# Patient Record
Sex: Female | Born: 1940 | Race: White | Hispanic: No | Smoking: Never smoker
Health system: Southern US, Community
[De-identification: ages and names within clinical notes are randomized; demographics above are authoritative.]

## PROBLEM LIST (undated history)

## (undated) DIAGNOSIS — K5790 Diverticulosis of intestine, part unspecified, without perforation or abscess without bleeding: Secondary | ICD-10-CM

## (undated) DIAGNOSIS — N815 Vaginal enterocele: Secondary | ICD-10-CM

## (undated) DIAGNOSIS — E119 Type 2 diabetes mellitus without complications: Secondary | ICD-10-CM

## (undated) DIAGNOSIS — N183 Chronic kidney disease, stage 3 (moderate): Secondary | ICD-10-CM

## (undated) DIAGNOSIS — F0390 Unspecified dementia without behavioral disturbance: Secondary | ICD-10-CM

## (undated) DIAGNOSIS — I10 Essential (primary) hypertension: Secondary | ICD-10-CM

## (undated) DIAGNOSIS — E785 Hyperlipidemia, unspecified: Secondary | ICD-10-CM

## (undated) DIAGNOSIS — K047 Periapical abscess without sinus: Secondary | ICD-10-CM

## (undated) HISTORY — DX: Vaginal enterocele: N81.5

## (undated) HISTORY — PX: ABDOMINAL HYSTERECTOMY: SHX81

## (undated) HISTORY — DX: Type 2 diabetes mellitus without complications: E11.9

## (undated) HISTORY — PX: DENTAL SURGERY: SHX609

## (undated) HISTORY — DX: Diverticulosis of intestine, part unspecified, without perforation or abscess without bleeding: K57.90

## (undated) HISTORY — DX: Unspecified dementia, unspecified severity, without behavioral disturbance, psychotic disturbance, mood disturbance, and anxiety: F03.90

## (undated) HISTORY — PX: CHOLECYSTECTOMY: SHX55

## (undated) HISTORY — DX: Hyperlipidemia, unspecified: E78.5

## (undated) HISTORY — DX: Periapical abscess without sinus: K04.7

## (undated) HISTORY — PX: OTHER SURGICAL HISTORY: SHX169

## (undated) HISTORY — DX: Essential (primary) hypertension: I10

---

## 1997-08-10 ENCOUNTER — Inpatient Hospital Stay (HOSPITAL_COMMUNITY): Admission: RE | Admit: 1997-08-10 | Discharge: 1997-08-11 | Payer: Self-pay | Admitting: Gynecology

## 1998-05-04 ENCOUNTER — Other Ambulatory Visit: Admission: RE | Admit: 1998-05-04 | Discharge: 1998-05-04 | Payer: Self-pay | Admitting: Gynecology

## 1999-05-04 ENCOUNTER — Other Ambulatory Visit: Admission: RE | Admit: 1999-05-04 | Discharge: 1999-05-04 | Payer: Self-pay | Admitting: Gynecology

## 1999-08-22 ENCOUNTER — Encounter: Admission: RE | Admit: 1999-08-22 | Discharge: 1999-11-20 | Payer: Self-pay | Admitting: Endocrinology

## 2000-05-07 ENCOUNTER — Other Ambulatory Visit: Admission: RE | Admit: 2000-05-07 | Discharge: 2000-05-07 | Payer: Self-pay | Admitting: Gynecology

## 2000-09-20 ENCOUNTER — Ambulatory Visit (HOSPITAL_COMMUNITY): Admission: RE | Admit: 2000-09-20 | Discharge: 2000-09-20 | Payer: Self-pay | Admitting: Gastroenterology

## 2001-05-01 ENCOUNTER — Other Ambulatory Visit: Admission: RE | Admit: 2001-05-01 | Discharge: 2001-05-01 | Payer: Self-pay | Admitting: Gynecology

## 2001-06-10 ENCOUNTER — Ambulatory Visit (HOSPITAL_COMMUNITY): Admission: RE | Admit: 2001-06-10 | Discharge: 2001-06-10 | Payer: Self-pay | Admitting: Gastroenterology

## 2002-05-04 ENCOUNTER — Other Ambulatory Visit: Admission: RE | Admit: 2002-05-04 | Discharge: 2002-05-04 | Payer: Self-pay | Admitting: Gynecology

## 2002-09-14 ENCOUNTER — Emergency Department (HOSPITAL_COMMUNITY): Admission: EM | Admit: 2002-09-14 | Discharge: 2002-09-14 | Payer: Self-pay | Admitting: Emergency Medicine

## 2003-04-06 ENCOUNTER — Ambulatory Visit (HOSPITAL_COMMUNITY): Admission: RE | Admit: 2003-04-06 | Discharge: 2003-04-06 | Payer: Self-pay | Admitting: General Surgery

## 2003-05-03 ENCOUNTER — Encounter (INDEPENDENT_AMBULATORY_CARE_PROVIDER_SITE_OTHER): Payer: Self-pay | Admitting: *Deleted

## 2003-05-03 ENCOUNTER — Ambulatory Visit (HOSPITAL_COMMUNITY): Admission: RE | Admit: 2003-05-03 | Discharge: 2003-05-04 | Payer: Self-pay | Admitting: General Surgery

## 2003-05-24 ENCOUNTER — Other Ambulatory Visit: Admission: RE | Admit: 2003-05-24 | Discharge: 2003-05-24 | Payer: Self-pay | Admitting: Gynecology

## 2004-05-23 ENCOUNTER — Other Ambulatory Visit: Admission: RE | Admit: 2004-05-23 | Discharge: 2004-05-23 | Payer: Self-pay | Admitting: Gynecology

## 2005-06-04 ENCOUNTER — Other Ambulatory Visit: Admission: RE | Admit: 2005-06-04 | Discharge: 2005-06-04 | Payer: Self-pay | Admitting: Gynecology

## 2006-04-10 ENCOUNTER — Ambulatory Visit: Payer: Self-pay | Admitting: Gastroenterology

## 2006-04-29 ENCOUNTER — Ambulatory Visit: Payer: Self-pay | Admitting: Gastroenterology

## 2006-06-29 ENCOUNTER — Inpatient Hospital Stay (HOSPITAL_COMMUNITY): Admission: AD | Admit: 2006-06-29 | Discharge: 2006-07-01 | Payer: Self-pay | Admitting: Internal Medicine

## 2006-07-01 ENCOUNTER — Ambulatory Visit: Payer: Self-pay | Admitting: Vascular Surgery

## 2006-07-01 ENCOUNTER — Encounter (INDEPENDENT_AMBULATORY_CARE_PROVIDER_SITE_OTHER): Payer: Self-pay | Admitting: Internal Medicine

## 2007-05-28 ENCOUNTER — Ambulatory Visit: Payer: Self-pay | Admitting: Cardiology

## 2007-05-28 ENCOUNTER — Inpatient Hospital Stay (HOSPITAL_COMMUNITY): Admission: EM | Admit: 2007-05-28 | Discharge: 2007-05-30 | Payer: Self-pay | Admitting: Emergency Medicine

## 2007-05-29 ENCOUNTER — Ambulatory Visit: Payer: Self-pay | Admitting: Vascular Surgery

## 2007-05-29 ENCOUNTER — Encounter (INDEPENDENT_AMBULATORY_CARE_PROVIDER_SITE_OTHER): Payer: Self-pay | Admitting: Internal Medicine

## 2007-05-30 ENCOUNTER — Ambulatory Visit: Payer: Self-pay

## 2007-05-30 ENCOUNTER — Encounter: Payer: Self-pay | Admitting: Endocrinology

## 2007-07-31 ENCOUNTER — Other Ambulatory Visit: Admission: RE | Admit: 2007-07-31 | Discharge: 2007-07-31 | Payer: Self-pay | Admitting: Gynecology

## 2008-10-27 ENCOUNTER — Ambulatory Visit (HOSPITAL_COMMUNITY): Admission: RE | Admit: 2008-10-27 | Discharge: 2008-10-28 | Payer: Self-pay | Admitting: Obstetrics and Gynecology

## 2009-10-27 ENCOUNTER — Emergency Department (HOSPITAL_COMMUNITY): Admission: EM | Admit: 2009-10-27 | Discharge: 2009-10-27 | Payer: Self-pay | Admitting: Emergency Medicine

## 2010-03-24 ENCOUNTER — Other Ambulatory Visit: Payer: Self-pay | Admitting: Gynecology

## 2010-03-24 DIAGNOSIS — R928 Other abnormal and inconclusive findings on diagnostic imaging of breast: Secondary | ICD-10-CM

## 2010-03-31 ENCOUNTER — Ambulatory Visit
Admission: RE | Admit: 2010-03-31 | Discharge: 2010-03-31 | Disposition: A | Discharge status: Home / Self Care | Payer: Medicare Other | Source: Ambulatory Visit | Attending: Gynecology | Admitting: Gynecology

## 2010-03-31 ENCOUNTER — Other Ambulatory Visit: Payer: Self-pay | Admitting: Gynecology

## 2010-03-31 DIAGNOSIS — R928 Other abnormal and inconclusive findings on diagnostic imaging of breast: Secondary | ICD-10-CM

## 2010-04-27 LAB — CBC
HCT: 30.5 % — ABNORMAL LOW (ref 36.0–46.0)
HCT: 40.1 % (ref 36.0–46.0)
Hemoglobin: 13.5 g/dL (ref 12.0–15.0)
MCHC: 34.5 g/dL (ref 30.0–36.0)
MCV: 95.2 fL (ref 78.0–100.0)
RBC: 3.2 MIL/uL — ABNORMAL LOW (ref 3.87–5.11)
WBC: 7.7 10*3/uL (ref 4.0–10.5)

## 2010-04-27 LAB — COMPREHENSIVE METABOLIC PANEL
AST: 20 U/L (ref 0–37)
Albumin: 4 g/dL (ref 3.5–5.2)
Alkaline Phosphatase: 66 U/L (ref 39–117)
BUN: 20 mg/dL (ref 6–23)
BUN: 7 mg/dL (ref 6–23)
CO2: 30 mEq/L (ref 19–32)
Calcium: 7.9 mg/dL — ABNORMAL LOW (ref 8.4–10.5)
Chloride: 99 mEq/L (ref 96–112)
Creatinine, Ser: 0.87 mg/dL (ref 0.4–1.2)
GFR calc Af Amer: >60 mL/min (ref >60)
GFR calc non Af Amer: >60 mL/min (ref >60)
Glucose, Bld: 121 mg/dL — ABNORMAL HIGH (ref 70–99)
Potassium: 3.6 mEq/L (ref 3.5–5.1)
Total Bilirubin: 1.1 mg/dL (ref 0.3–1.2)

## 2010-04-27 LAB — GLUCOSE, CAPILLARY
Glucose-Capillary: 110 mg/dL — ABNORMAL HIGH (ref 70–99)
Glucose-Capillary: 111 mg/dL — ABNORMAL HIGH (ref 70–99)
Glucose-Capillary: 99 mg/dL (ref 70–99)

## 2010-06-06 NOTE — H&P (Signed)
[name], [name]                ACCOUNT NO.:  [id number]   MEDICAL RECORD NO.:  [id number]          PATIENT TYPE:  INP   LOCATION:  0104                         FACILITY:  [facility]   PHYSICIAN:  Herbie Saxon, MDDATE OF BIRTH:  1940-10-20   DATE OF ADMISSION:  05/28/2007  DATE OF DISCHARGE:                              HISTORY & PHYSICAL   PRESENTING COMPLAINT:  Chest pain x1 day.   HISTORY OF PRESENT ILLNESS:  A 70 year old Caucasian lady with a past  medical history of hypertension, diabetes, hyperlipidemia, a family  history of coronary artery disease and also a history of diverticulosis.  The patient has been noticing left arm pain for the last six months with  intermittent numbness.  Then she had an episode of chest pain that was  very transient one week ago.  Today while she was driving, the patient  noticed chest pain at about a 3-4/10, sharp, very transient, less than  two seconds.  She noticed left upper extremity tingling most marked in  the left palm.  One week ago she had a chest pain which she noticed by  dropping a bag she was carrying and after resting the chest pain  subsided.  The patient also notices that when she gets up from sitting  appropriately, she gets dizzy.  She denies any palpitations.  No  syncopal episodes.  No seizures.  No cough or shortness of breath, no  fever.  The patient has no history of chest trauma.  She does have  frequent heartburn.  She is on Prilosec for this.  There is no diarrhea,  constipation or abdominal distention.  She complies with her medication,  and her last hemoglobin A1c check was 5.6.  There is no joint swelling.  There is no blurring of vision.  She denies any orthopnea, paroxysmal  nocturnal dyspnea or pedal swelling.  No hematuria, dysuria or frequency  of urination.   PAST MEDICAL HISTORY:  As stated earlier.   PAST SURGICAL HISTORY:  1. Cholecystectomy.  2. Hysterectomy.  3. Knee surgery.   SOCIAL HISTORY:   She is married.  She has three children.  There is no  history of drug, alcohol or tobacco abuse.   FAMILY HISTORY:  Father died of coronary artery disease at age 65.  He  also had diabetes.   MEDICATIONS:  1. Actos 30 mg daily.  2. Cymbalta 20 mg daily.  3. Lisinopril 40 mg daily.  4. Multivitamin one tab daily.  5. Fish oil 3000 mg daily.  6. Niacin 500 mg daily.  7. Hydroxyzine hydrochloride 10 mg p.r.n.  8. Prilosec 20 mg daily.   ALLERGIES:  FLAGYL.   REVIEW OF SYSTEMS:  Review of systems reviewed.  Pertinent positives as  stated above.   PHYSICAL EXAMINATION:  GENERAL:  She is an older lady, not in acute  distress.  She is obese.  She is anxious.  VITAL SIGNS:  Temperature 97.2 degrees, pulse 82, respirations 18, blood  pressure 150/97.  HEENT:  Pupils equal, reactive to light and accommodation.  Extraocular  muscles intact.  Oropharynx and  nasopharynx mucous membranes are moist.  NECK:  Supple.  No evidence of jugular venous distention, no carotid  bruits, no thyromegaly.  HEART:  Sounds 1 and 2.  Regular rate and rhythm.  No murmurs, no  thrills, no heaves, no gallops.  CHEST:  Clinically clear.  No rales or rhonchi.  ABDOMEN:  Truncal obesity, soft, nontender.  No organomegaly palpable.  Inguinal orifices are patent.  NEUROLOGIC:  She is alert and oriented to time, place and person.  Power  is 5/5.  Cranial nerves II-XII  intact.  Sensory system grossly normal.  EXTREMITIES:  Peripheral pulses present.  No pedal edema.   LABORATORY DATA:  WBC 6.5, hematocrit 38, platelet count 306.  Sodium  138, potassium 4.6, chloride 105, bicarbonate 26, glucose 121, BUN 28,  creatinine 1.2.  Troponin less than 0.05.  Saturation 99% on room air.   Electrocardiogram shows a normal sinus rhythm at 94 per minute, left  atrial enlargement, borderline.   ASSESSMENT:  1. Chest pain, rule out unstable angina.  2. Dehydration.  3. Acute pre-renal azotemia.  4. Dizziness and  lethargic symptoms.  5. Mild hypertension.  6. Diabetes, stable.  7. Morbid obesity.  8. Hyperlipidemia.   PLAN:  She needs to be admitted to a telemetry bed.  Her diet will be  1800 calorie ADA, low-cholesterol, 2-gram sodium diet.  Her activity  will be bedrest.  D-5-W half normal saline at 25 mL an hour.  O2 at 2 to  5 liters by nasal cannula to keep her saturations at greater than 90%.  Lopressor 2.5 mg IV q.6h. p.r.n. for blood pressure greater than  160/110.  Will get serial cardiac enzymes and electrocardiograms q.8h.  x3.  Obtain a cardiac beta natriuretic peptide, thyroid function tests,  repeat homocysteine, sodium, calcium, magnesium, phosphate level.  Recheck a Hemoccult x3.  Obtain a D-dimer, a 2-D echocardiogram, carotid  Dopplers, CT of the brain.  Start her on Lovenox 40 mg subcu daily,  Protonix 40 mg IV daily, Lovenox for deep venous thrombosis prophylaxis.  This can be adjusted by pharmacy, aspirin 325 mg daily, Protonix 40 mg  IV q.24h, Lovenox 1 mg subcu q.12h; pharmacy to adjust, albuterol one  unit dose q.6h. p.r.n., NovoLog sliding scale insulin coverage  persistent scale, oxycodone  5 mg q.4h. p.r.n., morphine 2 mg IV q.6h.  p.r.n.  Start on Nitrol paste 1/2 inch q.6h., metoprolol 25 mg twice  daily, Lisinopril 20 mg daily.  Continue with her home medications of  Cymbalta, Actos, niacin, fish oil and nitroglycerin p.r.n.  Get a CBC  and BMP in the morning.  A cardiology evaluation, page Dr. Corinda Gubler.  The  patient ,s illness, medication ,tests were explained to her and she  verbalizes understanding.     Herbie Saxon, MD  Electronically Signed    MIO/MEDQ  D:  05/28/2007  T:  05/28/2007  Job:  [id number]

## 2010-06-06 NOTE — Discharge Summary (Signed)
[name], [name]                ACCOUNT NO.:  [id number]   MEDICAL RECORD NO.:  [id number]          PATIENT TYPE:  INP   LOCATION:  1418                         FACILITY:  [facility]   PHYSICIAN:  Herbie Saxon, MDDATE OF BIRTH:  08/14/1940   DATE OF ADMISSION:  05/28/2007  DATE OF DISCHARGE:  05/30/2007                               DISCHARGE SUMMARY   DISCHARGE DIAGNOSES:  1. Atypical chest pain.  2. Diabetes mellitus.  3. Hypertension, stable.  4. Hyperlipidemia.  5. Dehydration, improved.  6. History of diverticulosis.  7. Family history of coronary artery disease.   CONSULTS:  Dr. Andee Lineman, [facility].   PROCEDURES:  Patient scheduled for adenosine Myoview at the cardiology  office by Dr. Antoine Poche today, May 30, 2007.   RADIOLOGY:  The CT head of May 28, 2007, negative.  Chest x-ray on May 28, 2007, showed no acute cardiopulmonary disease.   HOSPITAL COURSE:  A 70 year old Caucasian lady presented to the  emergency room at [facility] with the complaint of substernal chest  pain radiating to the left arm with intermittent numbness.  She has been  having on and off left arm pain for the last 6 months.  Patient also  complained of some dizziness with onset of symptoms.  At this admission,  the serial cardiac enzymes have been negative.  Patient was started on  Nitropaste and full-dose Lovenox.  This has been scheduled along with  prophylactic dose Lovenox and nitroglycerin p.r.n.  Blood pressure on  admission was moderately elevated but this has been adequately  controlled with lisinopril with the addition ofToprol.  Toprol was added  on discharge.  Patient was known to be dehydrated and she was given  gentle IV fluid hydration; this also improved.  Anemia is staying  stable.  Hemoccult was negative.   DISCHARGE CONDITION:  Stable.   DIET:  An 1800 calorie, ADA, low cholesterol, low sodium, heart healthy.   ACTIVITY:  Increase slowly as tolerated after having  the adenosine  Myoview today.   FOLLOWUP:  1. With her primary care physician, Dr Lanell Persons of [facility] in 3 to 5 days.  2. Follow up with Dr. Antoine Poche at the cardiology office today at 11:30      a.m.   DISCHARGE MEDICATIONS:  1. Nitroglycerin 0.4 mg sublingual p.r.n. every 5 minutes x3.  2. Percocet 5/325 one to 2 tablets every 4 to 6 hours p.r.n.  3. Toprol XL 12.5 mg daily.  4. Lisinopril 20 mg daily.  5. Foltx1 tablet daily.  6. Enteric-coated aspirin 81 mg daily.  7. Accu-Cheks a.c. and h.s.  8. NovoLog sliding scale coverage with sensitive scale.   Please note that the patient was noted to have elevated homocystine  level and she was started on Foltx.   CURRENT MEDICATIONS:  1. Actos 30 mg daily.  2. Simvastatin 20 mg daily.  3. Multivitamin 1 tablet daily.  4. Fish oil 3000 mg daily.  5. Niacin 500 mg daily.  6. Hydroxyzine 10 mg every 8 hours p.r.n.   EXAMINATION:  Today, she is an elderly lady not in acute distress.  Temperature is 98.  Pulse is 70.  Respiratory rate is 20.  Blood  pressure 121/60.  Pupils are equal and reactive to light and  accommodation.  Extraocular muscles intact.  Mucous membranes are moist.  HEAD:  Atraumatic and normocephalic.  NECK:  Supple.  HEART:  Sounds 1 and 2, regular rate and rhythm.  CHEST:  Clinically clear.  No rales, rhonchi, or wheezes.  No chest  deformity.  ABDOMEN:  She has truncal obesity, nontender.  No organomegaly.  She is alert and oriented x3.  Speech is normal.  Cranial nerves II-XII  intact.  Power 5 globally.  Deep tendon reflexes 2 globally.  EXTREMITIES:  No pedal edema.  No skin rash.  No joint swelling.  Mood  is stable.   AVAILABLE LABS:  Show that the total cholesterol is 150, LDL 63, HDL 62,  BUN is 23, creatinine 1.3, hematocrit is 34, sodium 141, potassium 4.2,  chloride 109, bicarbonate 26, glucose 114.   DISCHARGE TIME:  Greater than 30 minutes.      Herbie Saxon, MD  Electronically Signed     MIO/MEDQ  D:  05/30/2007  T:  05/30/2007  Job:  [id number]   cc:   Learta Codding, MD,FACC  [address]  [place], [place] [address]   Rollene Rotunda, MD, [facility]  [address]  [address]  [place]  [place] [address]   Vikki Ports, M.D.  Fax: [phone]

## 2010-06-06 NOTE — Discharge Summary (Signed)
[name], [name]                ACCOUNT NO.:  [id number]   MEDICAL RECORD NO.:  [id number]          PATIENT TYPE:  INP   LOCATION:  5703                         FACILITY:  [facility]   PHYSICIAN:  Ellie Lunch, M.D.      DATE OF BIRTH:  Jun 08, 1940   DATE OF ADMISSION:  06/29/2006  DATE OF DISCHARGE:  07/01/2006                               DISCHARGE SUMMARY   DISCHARGE DIAGNOSES:  1. Right lower extremity cellulitis.  2. Large hematoma on the lateral aspect of the right knee secondary to      fall.   PAST MEDICAL HISTORY:  1. Type 2 diabetes mellitus.  2. Hypertension.  3. Dyslipidemia.  4. Status post hysterectomy.  5. Status post cholecystectomy.  6. Status post left knee surgery.   DISCHARGE MEDICATIONS:  1. Bactrim DS one pill twice a day for seven more days.  2. Byetta 10 mcg shots twice a day  3. Advicor 500/20 mg daily.  4. Actos 45 mg daily.  5. Furosemide 20 mg daily.  6. Lisinopril 40 mg daily.  7. Prilosec 40 mg daily.  8. Fish oil 1000 mg daily.  9. Spectro-Vite one tablet daily.  10.Baby aspirin 81 mg daily.  11.Caltrate 600 mg p.o. b.i.d.   DISPOSITION AND FOLLOWUP:  The patient will follow up with her primary  care physician in about 1 week to ensure resolution of her right lower  extremity cellulitis.   PROCEDURES DONE:  1. Dopplers performed of the lower extremity did not show any evidence      of DVT, superficial thrombosis or Baker's cyst.  There was a large      hematoma noted on the lateral aspect of the knee.  2. X-rays performed of the tibia and fibula did not show any      significant bony findings or fractures.   ADMISSION HISTORY AND PHYSICAL:  For complete details, please refer to  Dr. Theresa Duty H&P.  In brief, [name] is a 70 year old female that  had fallen at home about 8 days prior to admission after which she  developed redness around the right lower extremity along with extensive  bruising.  She went to see her primary care  physician who put her some  antibiotics, namely Keflex.  She said she was getting better but then  her swelling was getting worse, and thus went to the walk-in clinic and  was then admitted from there for right lower extremity cellulitis.   HOSPITAL COURSE:  #1 - RIGHT LOWER EXTREMITY CELLULITIS.  The patient  had failed Keflex treatment.  She was then placed on IV antibiotics  while she was the hospital.  Note blood cultures were negative.  She is  being discharged home on Bactrim DS, which she was already asked to take  for a right first index fingernail infection, and thus I think it will  also cover the right lower extremity cellulitis.  Also note the patient  has been told that if she does not respond to Bactrim, I have given her  a prescription of Levaquin to help since she was on  IV Levaquin in the  hospital.  Note blood cultures were negative.  The patient had at least  at 75% resolution in the erythema and extent of her cellulitis from day  of admission.  The patient will follow up with her PCP in 1 week to  ensure resolution.   #2 - LARGE HEMATOMA ON THE LATERAL ASPECT OF THE RIGHT KNEE.  This is  most likely secondary to fall.  Note the patient was not on any blood  thinners.  However, she has been told to use hot compresses and  symptomatic treatment with Tylenol and to give rest to that knee.  Note  Dopplers were done and were negative for any form of DVT.   All other conditions were stable and maintained on home medications.  For further details, you may refer to the hospital chart.      Ellie Lunch, M.D.  Electronically Signed     BP/MEDQ  D:  07/01/2006  T:  07/01/2006  Job:  [id number]   cc:   Vikki Ports, M.D.

## 2010-06-06 NOTE — Consult Note (Signed)
[name], [name]                ACCOUNT NO.:  [id number]   MEDICAL RECORD NO.:  [id number]          PATIENT TYPE:  INP   LOCATION:  0104                         [facility]AC[facility]   PHYSICIAN:  Learta Codding, MD,FACC DATE OF BIRTH:  07-19-1940   DATE OF CONSULTATION:  05/28/2007  DATE OF DISCHARGE:                                 CONSULTATION   CARDIOLOGY CONSULTATION   REASON FOR CONSULTATION:  Evaluation of left arm pain and left hand  tingling.   HISTORY OF PRESENT ILLNESS:  The patient is a 70 year old, retired,  white female with multiple cardiac risk factors.  The patient has a  history of hypertension and diabetes mellitus as well as obesity.  She  also has known hyperlipidemia and is on cholesterol lowering agents.  The patient has had a prior stress test approximately six years ago by  Dr. Romero Belling, which was reportedly within normal limits.  The  patient has no prior history of substernal chest pain.  Today, the  patient stated that while driving she suddenly developed left upper arm  pain.  She also noticed a tingling in her left hand.  There was no  associated shortness of breath or diaphoresis.  Shortly after this  episode occurred, the patient did develop a very brief, sharp,  midsternal pain that only lasted seconds.  When she noticed associated  chest pain, the patient drove herself to the emergency room.  On  arrival, there were no electrocardiographic changes suggestive of  ischemia.  It appeared that the patient's left arm pain only lasted  approximately one hour.  She denies any shortness of breath, orthopnea,  or PND.  She also reports no palpitations or syncope.  Approximately a  week ago while at [facility] when she was carrying heavy packages, she did  notice some breathlessness and very briefly chest pain.  Otherwise, the  patient is a very active lady and she rides a bicycle and does yoga.  She did not experience any substernal chest pain on exertion over  the  last several weeks to months.   ALLERGIES:  FLAGYL.   MEDICATION LIST:  1. Actos 30 mg p.o. daily.  2. Simvastatin 20 mg p.o. daily.  3. Lisinopril 30 mg p.o. daily.  4. Multivitamin.  5. Fish oil.  6. Niacin 5 mg daily.  7. Hydroxyzine 10 mg as needed.   PAST MEDICAL HISTORY:  1. Cellulitis of the right lower extremity with a history of hematoma      secondary to fall.  2. Type 2 diabetes mellitus.  3. Hypertension.  4. Dyslipidemia.  5. Status post hysterectomy.  6. Status post cholecystectomy.  7. Status post left knee surgery.   SOCIAL HISTORY:  The patient is married with three children.  Denies a  history of cigarette use or illicit drug use.  She does drink  occasionally.  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   FAMILY HISTORY:  Significant for aortic dissection in her father as well  as a history of heart valve replacement in her mother, who eventually  died from an arrhythmia.   REVIEW OF SYSTEMS:  The patient denies any nausea or vomiting, no fever  or chills, no melena or hematochezia, no dysuria or frequency.  The  remainder of the review of systems is negative.   PHYSICAL EXAMINATION:  VITAL SIGNS:  Blood pressure is 99/52 with a  heart rate of 99 beats per minute, respirations are 18, temperature is  99.4.  GENERAL:  An overweight white female but in no apparent distress.  HEENT:  Pupils are anisocoric, conjunctivae clear.  NECK:  Supple, normal carotid upstroke, no carotid bruits, and no  thyromegaly.  LYMPHATIC EXAM:  No adenopathy in the neck, axillae, or groins.  LUNG EXAM:  Clear breath sounds bilaterally with no wheezing and no  crackles.  HEART EXAM:  Regular rate and rhythm with a soft 2/6 systolic murmur at  the right upper sternal border, and also a 2/6 holosystolic murmur at  the left lower sternal border.  There is no S3.  ABDOMEN:  Soft and nontender with no rebound or guarding, good bowel  sounds.  There is no  hepatosplenomegaly.  EXTREMITY EXAM:  No cyanosis, clubbing, or edema.  The patient has  varicose veins.  NEURO:  The patient is alert, oriented, grossly nonfocal, and cranial  nerves II-XII are grossly intact.  SKIN EXAM:  Essentially within normal limits and normal skin turgor and  no skin lesions.   Electrocardiogram:  Normal sinus rhythm with borderline left atrial  enlargement.  No lateral acute ischemic change.   LABORATORY DATA:  Hemoglobin is 13.1, hematocrit 38.8, MCV is 93.1,  platelet count is 306, protime is 12.7, INR is 0.9, sodium is 138,  potassium is 4.6, chloride is 105, CO2 is 26, glucose 121, BUN is 28,  creatinine is 1.23, GFR is 44.  Troponin is less than 0.05.   Chest x-ray shows no cardiomegaly and no acute abnormalities.   PROBLEM LIST:  1. Substernal chest pain.  Rule out unstable angina.      a.     Cardiac risk factors (diabetes mellitus, hypertension,       obesity, dyslipidemia, questionable family history).      b.     Atypical symptoms with negative initial troponins and EKG.  2. Cardiac murmur as described above.  3. Diabetes mellitus on oral hypoglycemics.  4. Hypertension, controlled.  5. Dyslipidemia on a statin drug therapy.   PLAN:  1. The patient's symptoms are rather atypical, although she is a      diabetic.  However, there are no exertional symptoms.  The patient      can be ruled out for myocardial infarction and if enzyme markers      are negative and she has no further symptoms, anticipate that the      patient can be worked up for ischemic heart disease on an      outpatient basis.  2. If cardiac enzymes are positive, certainly we would proceed with a      cardiac catheterization given her high __________ for probability      of CAD.  If cardiac enzymes are negative, the patient's study can      be arranged with close followup by [facility] in the      outpatient setting.  The patient should also be provided with       aspirin and p.r.n. nitroglycerin at her current medication list.  3. The patient does have cardiac murmurs both at the aortic point and  the mitral point.  I suspect the aortic      murmur is physiologic and possibly related to aortic sclerosis;      however, an echocardiographic study is warranted given the 2/6      murmur at the left lower sternal border consistent with mitral      regurgitation.  4. I discontinued the order of IV heparin as there was a dual order      written for both IV heparin and Lovenox.      Learta Codding, MD,FACC  Electronically Signed     GED/MEDQ  D:  05/28/2007  T:  05/28/2007  Job:  [id number]   cc:   Broadus John T. Pamalee Leyden, MD   Hospitalist Service

## 2010-06-06 NOTE — H&P (Signed)
[name], [name]                ACCOUNT NO.:  [id number]   MEDICAL RECORD NO.:  [id number]          PATIENT TYPE:  INP   LOCATION:  5703                         FACILITY:  [facility]   PHYSICIAN:  Barnetta Chapel, MDDATE OF BIRTH:  February 20, 1940   DATE OF ADMISSION:  06/29/2006  DATE OF DISCHARGE:                              HISTORY & PHYSICAL   REASON FOR ADMISSION:  Cellulitis of the right lower extremity.   HISTORY OF PRESENTING COMPLAINT:  The patient is a 70 year old female  with past medical history significant for diabetes mellitus, status post  hysterectomy, cholecystectomy and left knee surgery.  The patient was  said to have fallen at home about 8 days ago.  She developed redness  around the right lower extremity afterwards.  She went to see her  primary care Kelsey Durflinger who put her on some antibiotics.  She has been on  antibiotics for about 8 days now.  She was eventually referred for  inpatient care, based on the fact that the cellulitis was not improving  significantly.  The patient denies any fever, chills, cough, chest pain,  shortness of breath, headache, neck pain, GI symptoms or urinary  symptoms.  The patient has significant bruise around the right lower  extremity.   PAST MEDICAL HISTORY:  Essentially as above.   MEDICATIONS:  1. Prior to admission include Byetta.  2. Avicor.  3. Actos.  4. Lasix 20 mg p.o. once daily.  5. Lisinopril 40 mg p.o. once daily.  6. Prilosec 40 mg p.o. once daily.   ALLERGIES:  FLAGYL.   SOCIAL HISTORY:  The patient is married with 3 children.  She denied  history of cigarette use or illicit drug use.  She has been drinking  alcohol almost daily for the last 30 years.  No past history of tremors  or withdrawal symptoms.   FAMILY HISTORY:  Significant for MI, diabetes and heart valve problems.   REVIEW OF SYSTEMS:  Essentially as above.   PHYSICAL EXAMINATION:  VITAL SIGNS:  The temperature is 98.1, the blood  pressure is  126/73, heart rate 108, respiratory rate 20 with O2  saturation 99% on room air.  GENERAL CONDITION:  The patient is not in any obvious distress.  HEENT:  No pallor, no jaundice.  Extraocular muscle movements are  intact.  NECK:  Supple.  There is no raised JVD or lymphadenopathy.  LUNGS:  Clear to auscultation.  CARDIOVASCULAR:  S1 and S2, no heart murmur appreciated.  ABDOMEN:  Obese, soft and nontender.  No organomegaly.  Bowel sounds are  present.  NEUROLOGIC:  Examination is nonfocal.  EXTREMITIES:  The left lower extremity has no edema.  The right lower  extremity is swollen, has significant bruise and a very small area  around the anterior part of the shin which is a bit reddish.  The  patient's area of maximal tenderness to palpation is around the mid-  shin.   IMPRESSION:  1. Suspect fracture of the right mid-tibia.  2. Suspect cellulitis of the right lower extremity.  3. Bruise and hematoma of the right lower  extremity following a fall.  4. History of diabetes mellitus.   PLAN:  1. Will admit the patient to a regular medical floor.  Will get an x-      ray of the right tibia.  Will start the patient on IV Levaquin 750      mg once daily and monitor accordingly.  2. Will control the patient's blood pressure.  3. Will get CBC, CMP, magnesium and __________  and fasting lipid      profile.  Further management will depend on hospital course.      Barnetta Chapel, MD  Electronically Signed     SIO/MEDQ  D:  06/29/2006  T:  06/29/2006  Job:  [id number]   cc:   [facility] [employer]

## 2010-06-09 NOTE — Op Note (Signed)
NAME:  [name], [name]                    ACCOUNT NO.:  [id number]   MEDICAL RECORD NO.:  [id number]                   PATIENT TYPE:  OIB   LOCATION:  5502                                 FACILITY:  [facility]   PHYSICIAN:  Sharlet Salina T. Hoxworth, M.D.          DATE OF BIRTH:  November 30, 1940   DATE OF PROCEDURE:  05/03/2003  DATE OF DISCHARGE:                                 OPERATIVE REPORT   PREOPERATIVE DIAGNOSIS:  Cholelithiasis and cholecystitis.   POSTOPERATIVE DIAGNOSES:  Cholelithiasis and cholecystitis.   SURGICAL PROCEDURE:  Laparoscopic cholecystectomy.   SURGEON:  Lorne Skeens. Hoxworth, M.D.   ASSISTANT:  Rose Phi. Maple Hudson, M.D.   ANESTHESIA:  General.   BRIEF HISTORY:  [name] is a 70 year old diabetic female who presents  with typical right upper quadrant abdominal pain intermittently, and a  gallbladder ultrasound shows a single large gallstone.  Laparoscopic  cholecystectomy has been recommended and accepted.  This procedure, its  indications, risks of bleeding, infection, bile leak, bile duct injury were  discussed and understood preoperatively. She is now brought to the operating  room for this procedure.   DESCRIPTION OF PROCEDURE:  The patient was brought to the operating room,  placed in the supine position on the operating table and general  endotracheal  anesthesia was induced.  She received preoperative antibiotics.  The PSA  hose were in place.  The abdomen was widely sterilely prepped and draped.  Local anesthesia was used to infiltrate the trocar sites prior to the  incision.  A 1 cm incision was made at the umbilicus and dissection carried  down to the midline tract which was sharply incised 1 cm and the peritoneum  entered under direct vision.  Through a mattress suture of 0 Vicryl the  Hasson trocar was placed and pneumoperitoneum was established.  Under direct  vision, the 10 mm trocar was placed in the subxiphoid area and two 5 mm  trocars and one  in the right subcostal margin.  The gallbladder was  visualized and the fundus grasped and elevated up over the liver.  There  were some omental adhesions that were taken down bluntly with cautery.  The  infundibulum was exposed and retracted inferolateral.  Fibrofatty tissue was  stripped off the neck of the gallbladder toward the port of hepatis.  The  distal gallbladder was thoroughly dissected as was Calot's triangle dividing  the peritoneum anterior and posterior.  The anterior and posterior branches  of the cystic artery were identified, coursing up to the gallbladder wall  and the cystic duct was dissected free about 1 cm in length and the cystic  duct gallbladder junction was dissected 360 degrees.  The cystic duct was  small caliber.  When the anatomy was clear, the cystic duct was doubly  proximally, clipped distally and divided.  Individual branches of the cystic  artery were divided between clips.  The gallbladder was dissected free from  its bed using hook cautery  and removed through the umbilicus.  Complete  hemostasis was obtained of the gallbladder bed.  Trocars were removed under  direct vision and all CO2 evacuated from the peritoneal cavity.  The  mattress suture was secured at the umbilicus.  Skin incisions were closed with interrupted subcuticular 4-0 Monocryl and  Steri-Strips.  Sponge, needle and instrument counts were correct.  Dry  sterile dressings was applied, and the patient taken to recovery in good  condition.                                               Lorne Skeens. Hoxworth, M.D.    Tory Emerald  D:  05/03/2003  T:  05/04/2003  Job:  [id number]

## 2010-08-23 ENCOUNTER — Ambulatory Visit (HOSPITAL_COMMUNITY)
Admission: RE | Admit: 2010-08-23 | Discharge: 2010-08-23 | Disposition: A | Discharge status: Home / Self Care | Payer: Medicare Other | Source: Ambulatory Visit | Attending: Ophthalmology | Admitting: Ophthalmology

## 2010-08-23 ENCOUNTER — Ambulatory Visit (HOSPITAL_COMMUNITY): Payer: Medicare Other

## 2010-08-23 DIAGNOSIS — I10 Essential (primary) hypertension: Secondary | ICD-10-CM | POA: Insufficient documentation

## 2010-08-23 DIAGNOSIS — H543 Unqualified visual loss, both eyes: Secondary | ICD-10-CM | POA: Insufficient documentation

## 2010-08-23 DIAGNOSIS — Z79899 Other long term (current) drug therapy: Secondary | ICD-10-CM | POA: Insufficient documentation

## 2010-08-23 DIAGNOSIS — H35349 Macular cyst, hole, or pseudohole, unspecified eye: Secondary | ICD-10-CM | POA: Insufficient documentation

## 2010-08-23 DIAGNOSIS — Z01818 Encounter for other preprocedural examination: Secondary | ICD-10-CM | POA: Insufficient documentation

## 2010-08-23 DIAGNOSIS — E119 Type 2 diabetes mellitus without complications: Secondary | ICD-10-CM | POA: Insufficient documentation

## 2010-08-23 DIAGNOSIS — Z01812 Encounter for preprocedural laboratory examination: Secondary | ICD-10-CM | POA: Insufficient documentation

## 2010-08-23 LAB — CBC
Hemoglobin: 14.2 g/dL (ref 12.0–15.0)
MCH: 30.5 pg (ref 26.0–34.0)
MCHC: 33.7 g/dL (ref 30.0–36.0)
Platelets: 282 10*3/uL (ref 150–400)
RDW: 13.1 % (ref 11.5–15.5)

## 2010-08-23 LAB — BASIC METABOLIC PANEL
Calcium: 10.7 mg/dL — ABNORMAL HIGH (ref 8.4–10.5)
GFR calc Af Amer: >60 mL/min (ref >60)
GFR calc non Af Amer: 52 mL/min — ABNORMAL LOW (ref >60)
Potassium: 4.8 mEq/L (ref 3.5–5.1)
Sodium: 142 mEq/L (ref 135–145)

## 2010-08-23 LAB — GLUCOSE, CAPILLARY: Glucose-Capillary: 113 mg/dL — ABNORMAL HIGH (ref 70–99)

## 2010-08-23 LAB — SURGICAL PCR SCREEN: Staphylococcus aureus: POSITIVE — AB

## 2010-08-30 NOTE — Op Note (Signed)
[name], [name]                ACCOUNT NO.:  [id number]  MEDICAL RECORD NO.:  [id number]  LOCATION:  [facility]SC                         FACILITY:  [facility]  PHYSICIAN:  Jillyn Hidden A. Shonte Beutler, M.D.   DATE OF BIRTH:  04/24/1940  DATE OF PROCEDURE:  08/23/2010 DATE OF DISCHARGE:  08/23/2010                              OPERATIVE REPORT   PREOPERATIVE DIAGNOSIS:  Macular hole, right eye with vision loss.  POSTOPERATIVE DIAGNOSIS:  Macular hole, right eye with vision loss.  PROCEDURES: 1. 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 2. Injection of vitreous substitute - SF6 - 8%.  SURGEON:  Alford Highland. Khadija Thier, MD  ANESTHESIA:  Local with monitored anesthesia control.  INDICATIONS FOR PROCEDURE:  The patient is a 70 year old woman who has profound vision loss on basis of stage III macular hole.  She understands this is an attempt to close the macular hole as best to allow for visual acuity, stabilization, and improvement.  She understands the risks of anesthesia including recurrence of death, loss of the eye including, but not limited to from the condition as well as surgical repair, hemorrhage, infection, scarring, need for another surgery, no change in vision, loss of vision, progressive disease despite intervention and particularly in this case progression of cataract in the left eye.  She understands the risk and wants to proceed to preserve her vision.  Appropriate signed consent was obtained.  PROCEDURE IN DETAIL:  The patient was taken to the operating room. In the operating room, appropriate monitors followed by mild sedation. Appropriate site selection of the right eye was selected and the entire staff agreed.  Thereafter mild sedation 2% Xylocaine 5 mL injected retrobulbar with additional 5 mL laterally fascia modified Darel Hong. The right periocular region was sterilely prepped and draped in  the usual sterile fashion.  Lid speculum was applied.  A 25-gauge trocar was placed in infratemporal quadrant.  Superior trocars were applied.  Core vitrectomy was then begun.  Physician induced.  Iatrogenic posterior vitreous attachment was then created nasal to the optic nerve and along the macular region and elevated anterior to the equator at 360 degrees. The hole did not enlarge in size, although it did not close either. Vitreous incision was made to go ahead and stain the internal limiting membrane to proceed with internal limiting membrane peel.  Fluid air exchange completed.  Diluted solution of ICG (DYW) was then placed over the macular region and immediately aspirated.  A faint dilution of the internal limiting membrane was sufficient for identification.  Under fluid, 25-gauge force was then used to engage the internal limiting membrane and this was removed off the macular region with a radius of 1 disk diameters on all sides.  No complications occurred.  Fluid air exchange were then completed.  Under air, the hole appeared to close nicely.  No complications occurred.  Instruments were removed from the eye.  Trocars were removed from the eye.  The fusion was then removed and subconjunctival Decadron applied.  Sterile patch and Fox shield applied.  The patient was taken to  PACU in good stable condition.     Alford Highland Javonda Suh, M.D.     GAR/MEDQ  D:  08/23/2010  T:  08/24/2010  Job:  [id number]  Electronically Signed by Fawn Kirk M.D. on 08/30/2010 02:51:20 PM

## 2010-11-09 LAB — LIPID PANEL
Cholesterol: 191
HDL: 112
LDL Cholesterol: 65
Total CHOL/HDL Ratio: 1.7
Triglycerides: 68
VLDL: 14

## 2010-11-09 LAB — DIFFERENTIAL
Basophils Absolute: 0
Basophils Relative: 0
Eosinophils Absolute: 0.1
Eosinophils Relative: 2
Lymphocytes Relative: 19
Lymphs Abs: 1.5
Monocytes Absolute: 0.6
Monocytes Relative: 7
Neutro Abs: 6.1
Neutrophils Relative %: 73

## 2010-11-09 LAB — COMPREHENSIVE METABOLIC PANEL
ALT: 21
AST: 39 — ABNORMAL HIGH
Albumin: 3.6
Alkaline Phosphatase: 50
BUN: 18
CO2: 24
Calcium: 9.1
Chloride: 101
Creatinine, Ser: 1.54 — ABNORMAL HIGH
GFR calc Af Amer: 41 — ABNORMAL LOW
GFR calc non Af Amer: 34 — ABNORMAL LOW
Glucose, Bld: 133 — ABNORMAL HIGH
Potassium: 4.3
Sodium: 131 — ABNORMAL LOW
Total Bilirubin: 0.8
Total Protein: 6.5

## 2010-11-09 LAB — CULTURE, BLOOD (ROUTINE X 2)
Culture: NO GROWTH
Culture: NO GROWTH

## 2010-11-09 LAB — CBC
HCT: 32.8 — ABNORMAL LOW
Hemoglobin: 11 — ABNORMAL LOW
MCHC: 33.6
MCV: 94.2
Platelets: 274
RBC: 3.48 — ABNORMAL LOW
RDW: 15.1 — ABNORMAL HIGH
WBC: 8.3

## 2010-11-09 LAB — PHOSPHORUS: Phosphorus: 3.3

## 2010-11-09 LAB — HEMOGLOBIN A1C: Hgb A1c MFr Bld: 6.1

## 2010-11-09 LAB — MAGNESIUM: Magnesium: 1.4 — ABNORMAL LOW

## 2011-10-12 ENCOUNTER — Other Ambulatory Visit (HOSPITAL_COMMUNITY): Payer: Self-pay | Admitting: Family Medicine

## 2011-10-12 DIAGNOSIS — R011 Cardiac murmur, unspecified: Secondary | ICD-10-CM

## 2011-10-16 ENCOUNTER — Other Ambulatory Visit: Payer: Self-pay

## 2011-10-16 ENCOUNTER — Ambulatory Visit (HOSPITAL_COMMUNITY): Payer: Medicare Other | Attending: Cardiology | Admitting: Radiology

## 2011-10-16 DIAGNOSIS — R011 Cardiac murmur, unspecified: Secondary | ICD-10-CM | POA: Insufficient documentation

## 2011-10-16 DIAGNOSIS — I369 Nonrheumatic tricuspid valve disorder, unspecified: Secondary | ICD-10-CM | POA: Insufficient documentation

## 2011-10-16 DIAGNOSIS — E119 Type 2 diabetes mellitus without complications: Secondary | ICD-10-CM | POA: Insufficient documentation

## 2011-10-16 DIAGNOSIS — I059 Rheumatic mitral valve disease, unspecified: Secondary | ICD-10-CM | POA: Insufficient documentation

## 2011-10-16 DIAGNOSIS — I10 Essential (primary) hypertension: Secondary | ICD-10-CM | POA: Insufficient documentation

## 2011-10-16 DIAGNOSIS — I379 Nonrheumatic pulmonary valve disorder, unspecified: Secondary | ICD-10-CM | POA: Insufficient documentation

## 2011-10-16 NOTE — Progress Notes (Signed)
Echocardiogram performed.  

## 2011-10-17 ENCOUNTER — Encounter (HOSPITAL_COMMUNITY): Payer: Self-pay | Admitting: Family Medicine

## 2012-04-08 ENCOUNTER — Other Ambulatory Visit (HOSPITAL_COMMUNITY): Payer: Medicare Other

## 2012-04-08 ENCOUNTER — Ambulatory Visit (HOSPITAL_COMMUNITY): Payer: Medicare Other | Attending: Cardiology | Admitting: Radiology

## 2012-04-08 ENCOUNTER — Other Ambulatory Visit (HOSPITAL_COMMUNITY): Payer: Self-pay | Admitting: Family Medicine

## 2012-04-08 DIAGNOSIS — R011 Cardiac murmur, unspecified: Secondary | ICD-10-CM

## 2012-04-08 DIAGNOSIS — E119 Type 2 diabetes mellitus without complications: Secondary | ICD-10-CM | POA: Insufficient documentation

## 2012-04-08 DIAGNOSIS — I10 Essential (primary) hypertension: Secondary | ICD-10-CM | POA: Insufficient documentation

## 2012-04-08 DIAGNOSIS — I059 Rheumatic mitral valve disease, unspecified: Secondary | ICD-10-CM | POA: Insufficient documentation

## 2012-04-08 DIAGNOSIS — I079 Rheumatic tricuspid valve disease, unspecified: Secondary | ICD-10-CM | POA: Insufficient documentation

## 2012-04-08 DIAGNOSIS — E785 Hyperlipidemia, unspecified: Secondary | ICD-10-CM | POA: Insufficient documentation

## 2012-04-08 NOTE — Progress Notes (Signed)
Echocardiogram performed.  

## 2012-04-09 ENCOUNTER — Encounter (HOSPITAL_COMMUNITY): Payer: Self-pay | Admitting: Family Medicine

## 2012-04-09 NOTE — Progress Notes (Signed)
Echo report faxed to  Dr. Tanya Nones .Scarlette Ar

## 2012-04-18 ENCOUNTER — Encounter: Payer: Self-pay | Admitting: Family Medicine

## 2012-04-29 ENCOUNTER — Ambulatory Visit (INDEPENDENT_AMBULATORY_CARE_PROVIDER_SITE_OTHER): Payer: Medicare Other | Admitting: Family Medicine

## 2012-04-29 ENCOUNTER — Encounter: Payer: Self-pay | Admitting: Family Medicine

## 2012-04-29 VITALS — BP 110/72 | HR 84 | Temp 98.3°F | Resp 16 | Wt 201.0 lb

## 2012-04-29 DIAGNOSIS — IMO0001 Reserved for inherently not codable concepts without codable children: Secondary | ICD-10-CM

## 2012-04-29 DIAGNOSIS — E785 Hyperlipidemia, unspecified: Secondary | ICD-10-CM

## 2012-04-29 DIAGNOSIS — E119 Type 2 diabetes mellitus without complications: Secondary | ICD-10-CM | POA: Insufficient documentation

## 2012-04-29 DIAGNOSIS — K5790 Diverticulosis of intestine, part unspecified, without perforation or abscess without bleeding: Secondary | ICD-10-CM | POA: Insufficient documentation

## 2012-04-29 DIAGNOSIS — R2689 Other abnormalities of gait and mobility: Secondary | ICD-10-CM

## 2012-04-29 DIAGNOSIS — I10 Essential (primary) hypertension: Secondary | ICD-10-CM

## 2012-04-29 DIAGNOSIS — R29818 Other symptoms and signs involving the nervous system: Secondary | ICD-10-CM

## 2012-04-29 DIAGNOSIS — R1032 Left lower quadrant pain: Secondary | ICD-10-CM

## 2012-04-29 LAB — HEPATIC FUNCTION PANEL
Bilirubin, Direct: 0.2 mg/dL (ref 0.0–0.3)
Total Bilirubin: 1 mg/dL (ref 0.3–1.2)

## 2012-04-29 LAB — LIPID PANEL
HDL: 73 mg/dL (ref >39)
Triglycerides: 132 mg/dL (ref <150)

## 2012-04-29 LAB — HEMOGLOBIN A1C
Hgb A1c MFr Bld: 5.7 % — ABNORMAL HIGH (ref <5.7)
Mean Plasma Glucose: 117 mg/dL — ABNORMAL HIGH (ref <117)

## 2012-04-29 LAB — BASIC METABOLIC PANEL
BUN: 19 mg/dL (ref 6–23)
Creat: 1.25 mg/dL — ABNORMAL HIGH (ref 0.50–1.10)

## 2012-04-29 NOTE — Progress Notes (Signed)
Subjective:     Patient ID: [name], female   DOB: August 14, 1940, 72 y.o.   MRN: [id number]  HPI #1 diabetes mellitus type 2-currently on metformin 1000 mg by mouth twice daily.  Reports fasting blood sugars less than 1:30 and two-hour postprandial sugars less than 160. Reports daily diarrhea. She is also waking every morning with left lower quadrant abdominal pain that improves with defecation. She has a past medical history of diverticulosis. This pain is been going on for now for 3 months.  She denies fevers chills hematochezia or melena.  #2 is hypertension just around Cozaar 100 mg by mouth daily.  She denies chest pain shortness of breath or dyspnea on exertion.  #3 is hyperlipidemia she is currently taking Pravachol 40 mg by mouth daily. She denies right upper quadrant pain or myalgias.  Past Medical History  Diagnosis Date  . Diabetes mellitus without complication   . Hypertension   . Hyperlipidemia   . Diverticulosis    Medication list and allergies are reviewed.   Review of Systems Remainder of review of systems is negative.    Objective:   Physical Exam  Constitutional: She appears well-developed and well-nourished.  Eyes: Conjunctivae are normal. Pupils are equal, round, and reactive to light.  Neck: Normal range of motion. Neck supple. No JVD present. No thyromegaly present.  Cardiovascular: Normal rate, regular rhythm, normal heart sounds and intact distal pulses.  Exam reveals no gallop and no friction rub.   No murmur heard. Pulmonary/Chest: Effort normal and breath sounds normal. No respiratory distress. She has no wheezes. She has no rales. She exhibits no tenderness.  Abdominal: Soft. Bowel sounds are normal. She exhibits no distension and no mass. There is no tenderness. There is no rebound and no guarding.  Lymphadenopathy:    She has no cervical adenopathy.   diabetic foot exam is normal. She has normal sensation to 10 mg monofilament bilaterally. She has  normal pedal pulses.  There are no ulcers or calluses.     Assessment:     Type II or unspecified type diabetes mellitus without mention of complication, uncontrolled - Plan: Basic Metabolic Panel, Hepatic Function Panel, Hemoglobin A1c, Lipid Panel  Essential hypertension, benign  Other and unspecified hyperlipidemia  Abdominal pain, left lower quadrant      Plan:     1. Type II or unspecified type diabetes mellitus without mention of complication, uncontrolled On metformin for 2 weeks and see if diarrhea improves. Consider victoza vs [place]. - Basic Metabolic Panel - Hepatic Function Panel - Hemoglobin A1c - Lipid Panel  2. Essential hypertension, benign Well-controlled, same.  3. Other and unspecified hyperlipidemia The LDL is less than 100. Check fasting lipid panel.  4. Abdominal pain, left lower quadrant I suspect diverticulosis exacerbated by diarrhea. We're going to hold metformin for 2 weeks. If pain subsides we'll replace metformin. If pain persists may need to proceed with colonoscopy vs CT

## 2012-04-29 NOTE — Addendum Note (Signed)
Addended by: Lynnea Ferrier T on: 04/29/2012 01:38 PM   Modules accepted: Orders

## 2012-05-14 ENCOUNTER — Other Ambulatory Visit: Payer: Self-pay | Admitting: Family Medicine

## 2012-06-03 ENCOUNTER — Other Ambulatory Visit: Payer: Self-pay | Admitting: Family Medicine

## 2012-06-18 ENCOUNTER — Telehealth: Payer: Self-pay | Admitting: Family Medicine

## 2012-06-18 MED ORDER — LOSARTAN POTASSIUM 100 MG PO TABS: 100.0000 mg | ORAL_TABLET | Freq: Every day | ORAL | Status: DC

## 2012-06-18 NOTE — Telephone Encounter (Signed)
Rx Refilled  

## 2012-07-07 ENCOUNTER — Other Ambulatory Visit: Payer: Self-pay | Admitting: Family Medicine

## 2012-07-07 ENCOUNTER — Telehealth: Payer: Self-pay | Admitting: Family Medicine

## 2012-07-07 MED ORDER — PRAVASTATIN SODIUM 40 MG PO TABS: 40.0000 mg | ORAL_TABLET | Freq: Every day | ORAL | Status: DC

## 2012-07-07 NOTE — Telephone Encounter (Signed)
Rx Refilled  

## 2012-07-13 ENCOUNTER — Other Ambulatory Visit: Payer: Self-pay | Admitting: Family Medicine

## 2012-07-26 ENCOUNTER — Other Ambulatory Visit: Payer: Self-pay | Admitting: Family Medicine

## 2012-07-28 ENCOUNTER — Telehealth: Payer: Self-pay | Admitting: Family Medicine

## 2012-07-29 NOTE — Telephone Encounter (Signed)
Spoke to pt about this and she needs to call her insurance as we received a fax stating she does not require a  Prior [place]. for this medication.

## 2012-07-30 ENCOUNTER — Telehealth: Payer: Self-pay | Admitting: Family Medicine

## 2012-08-04 ENCOUNTER — Telehealth: Payer: Self-pay | Admitting: Family Medicine

## 2012-08-05 NOTE — Telephone Encounter (Signed)
Questions answered.

## 2012-08-15 ENCOUNTER — Other Ambulatory Visit: Payer: Self-pay | Admitting: Family Medicine

## 2012-09-05 NOTE — Telephone Encounter (Signed)
Pt was called.

## 2012-10-29 ENCOUNTER — Encounter: Payer: Self-pay | Admitting: Family Medicine

## 2012-10-29 NOTE — Telephone Encounter (Signed)
Patient is wanted to know why her Estradiol was DC'ed

## 2012-10-30 ENCOUNTER — Other Ambulatory Visit: Payer: Self-pay | Admitting: Family Medicine

## 2012-11-03 ENCOUNTER — Telehealth: Payer: Self-pay | Admitting: Family Medicine

## 2012-11-03 NOTE — Telephone Encounter (Signed)
Wants to know if her Estradiol has been called in to [facility]- [facility] call her/

## 2012-11-04 ENCOUNTER — Other Ambulatory Visit: Payer: Self-pay | Admitting: Family Medicine

## 2012-11-04 MED ORDER — METFORMIN HCL 1000 MG PO TABS: 1000.0000 mg | ORAL_TABLET | Freq: Two times a day (BID) | ORAL | Status: DC

## 2012-11-04 NOTE — Telephone Encounter (Signed)
This encounter was created in error - please disregard.

## 2012-11-04 NOTE — Telephone Encounter (Signed)
Rx Refilled  

## 2012-11-04 NOTE — Telephone Encounter (Signed)
Medication was called to pharmacy x 1 year

## 2012-11-05 ENCOUNTER — Other Ambulatory Visit: Payer: Medicare Other

## 2012-11-05 DIAGNOSIS — E119 Type 2 diabetes mellitus without complications: Secondary | ICD-10-CM

## 2012-11-05 DIAGNOSIS — Z79899 Other long term (current) drug therapy: Secondary | ICD-10-CM

## 2012-11-05 DIAGNOSIS — E782 Mixed hyperlipidemia: Secondary | ICD-10-CM

## 2012-11-05 LAB — LIPID PANEL
Cholesterol: 134 mg/dL (ref 0–200)
HDL: 56 mg/dL (ref >39)
LDL Cholesterol: 51 mg/dL (ref 0–99)
Total CHOL/HDL Ratio: 2.4 Ratio
Triglycerides: 133 mg/dL (ref <150)
VLDL: 27 mg/dL (ref 0–40)

## 2012-11-05 LAB — COMPREHENSIVE METABOLIC PANEL
ALT: 16 U/L (ref 0–35)
AST: 18 U/L (ref 0–37)
Alkaline Phosphatase: 59 U/L (ref 39–117)
BUN: 28 mg/dL — ABNORMAL HIGH (ref 6–23)
CO2: 25 mEq/L (ref 19–32)
Creat: 1.24 mg/dL — ABNORMAL HIGH (ref 0.50–1.10)
Total Bilirubin: 1 mg/dL (ref 0.3–1.2)

## 2012-11-05 LAB — CBC WITH DIFFERENTIAL/PLATELET
Basophils Absolute: 0 10*3/uL (ref 0.0–0.1)
Eosinophils Relative: 2 % (ref 0–5)
HCT: 37.9 % (ref 36.0–46.0)
Hemoglobin: 13.1 g/dL (ref 12.0–15.0)
Lymphocytes Relative: 32 % (ref 12–46)
MCHC: 34.6 g/dL (ref 30.0–36.0)
MCV: 90 fL (ref 78.0–100.0)
Monocytes Absolute: 0.5 10*3/uL (ref 0.1–1.0)
Monocytes Relative: 7 % (ref 3–12)
RDW: 13.1 % (ref 11.5–15.5)
WBC: 8 10*3/uL (ref 4.0–10.5)

## 2012-11-05 LAB — HEMOGLOBIN A1C: Mean Plasma Glucose: 126 mg/dL — ABNORMAL HIGH (ref <117)

## 2012-11-07 ENCOUNTER — Encounter: Payer: Self-pay | Admitting: Family Medicine

## 2012-11-07 ENCOUNTER — Ambulatory Visit (INDEPENDENT_AMBULATORY_CARE_PROVIDER_SITE_OTHER): Payer: Medicare Other | Admitting: Family Medicine

## 2012-11-07 VITALS — BP 132/82 | HR 84 | Temp 98.0°F | Resp 18 | Wt 198.0 lb

## 2012-11-07 DIAGNOSIS — G629 Polyneuropathy, unspecified: Secondary | ICD-10-CM

## 2012-11-07 DIAGNOSIS — E785 Hyperlipidemia, unspecified: Secondary | ICD-10-CM

## 2012-11-07 DIAGNOSIS — E119 Type 2 diabetes mellitus without complications: Secondary | ICD-10-CM

## 2012-11-07 DIAGNOSIS — G589 Mononeuropathy, unspecified: Secondary | ICD-10-CM

## 2012-11-07 DIAGNOSIS — I10 Essential (primary) hypertension: Secondary | ICD-10-CM

## 2012-11-07 DIAGNOSIS — Z23 Encounter for immunization: Secondary | ICD-10-CM

## 2012-11-07 NOTE — Progress Notes (Signed)
Subjective:    Patient ID: [name], female    DOB: 1940-08-22, 72 y.o.   MRN: [id number]  HPI This is a very pleasant 72 year old white female here today to followup for her diabetes mellitus type 2, hypertension, hyperlipidemia. She is currently taking metformin 1000 mg by mouth twice a day.  Most recent hemoglobin A1c is 6.0.  She denies any hypoglycemic episodes. She is also taking losartan 100 mg by mouth daily an adequate size at 12.5 mg by mouth daily for hypertension. She denies any chest pain shortness of breath or dyspnea on exertion. She is also on pravastatin 40 mg by mouth daily for hyperlipidemia. She denies any myalgia right quadrant pain. She does complain of neuropathic burning pain in the second and third toes on both feet. Past Medical History  Diagnosis Date  . Diabetes mellitus without complication   . Hypertension   . Hyperlipidemia   . Diverticulosis    Current Outpatient Prescriptions on File Prior to Visit  Medication Sig Dispense Refill  . aspirin 81 MG tablet Take 81 mg by mouth daily.      . Cholecalciferol (VITAMIN D) 2000 UNITS CAPS Take 2,000 Units by mouth daily.      Marland Kitchen ESTRADIOL [facility] 0.02 % vaginally. 1 ml twice a week (pt has this compounded at [facility]) [phone])      . fish oil-omega-3 fatty acids 1000 MG capsule Take 1,200 mg by mouth daily.      Marland Kitchen glucosamine-chondroitin 500-400 MG tablet Take 1 tablet by mouth 3 (three) times daily.      . hydrochlorothiazide (MICROZIDE) 12.5 MG capsule TAKE 1 CAPSULE EVERY DAY  90 capsule  1  . hydrOXYzine (ATARAX/VISTARIL) 10 MG tablet TAKE 1 TABLET BY MOUTH EVERY DAY  90 tablet  3  . losartan (COZAAR) 100 MG tablet Take 1 tablet (100 mg total) by mouth daily.  30 tablet  5  . metFORMIN (GLUCOPHAGE) 1000 MG tablet Take 1 tablet (1,000 mg total) by mouth 2 (two) times daily with a meal.  60 tablet  5  . NIACIN PO Take 400 mg by mouth daily.      Marland Kitchen omeprazole (PRILOSEC) 40 MG capsule TAKE 1  CAPSULE EVERY MORNING  90 capsule  3  . pravastatin (PRAVACHOL) 40 MG tablet TAKE 1 TABLET BY MOUTH AT BEDTIME  90 tablet  1   No current facility-administered medications on file prior to visit.   Allergies  Allergen Reactions  . Flagyl [Metronidazole] Hives  . Lisinopril     hyperkalemia   History   Social History  . Marital Status: Married    Spouse Name: N/A    Number of Children: N/A  . Years of Education: N/A   Occupational History  . Not on file.   Social History Main Topics  . Smoking status: Never Smoker   . Smokeless tobacco: Not on file  . Alcohol Use: Not on file  . Drug Use: Not on file  . Sexual Activity: Not on file   Other Topics Concern  . Not on file   Social History Narrative  . No narrative on file    Review of Systems  All other systems reviewed and are negative.       Objective:   Physical Exam  Vitals reviewed. Constitutional: She is oriented to person, place, and time. She appears well-developed and well-nourished.  Eyes: Conjunctivae are normal. No scleral icterus.  Neck: Neck supple. No JVD present. No thyromegaly  present.  Cardiovascular: Normal rate, regular rhythm, normal heart sounds and intact distal pulses.   No murmur heard. Pulmonary/Chest: Effort normal and breath sounds normal. No respiratory distress. She has no wheezes. She has no rales. She exhibits no tenderness.  Abdominal: Soft. Bowel sounds are normal. She exhibits no distension and no mass. There is no tenderness. There is no rebound and no guarding.  Musculoskeletal: She exhibits edema.  Lymphadenopathy:    She has no cervical adenopathy.  Neurological: She is alert and oriented to person, place, and time. She has normal reflexes. She displays normal reflexes. No cranial nerve deficit. She exhibits normal muscle tone. Coordination normal.  Skin: Skin is warm. No rash noted. No erythema. No pallor.   Lab on 11/05/2012  Component Date Value Range Status  . WBC  11/05/2012 8.0  4.0 - 10.5 K/uL Final  . RBC 11/05/2012 4.21  3.87 - 5.11 MIL/uL Final  . Hemoglobin 11/05/2012 13.1  12.0 - 15.0 g/dL Final  . HCT 16/10/9602 37.9  36.0 - 46.0 % Final  . MCV 11/05/2012 90.0  78.0 - 100.0 fL Final  . MCH 11/05/2012 31.1  26.0 - 34.0 pg Final  . MCHC 11/05/2012 34.6  30.0 - 36.0 g/dL Final  . RDW 54/09/8117 13.1  11.5 - 15.5 % Final  . Platelets 11/05/2012 303  150 - 400 K/uL Final  . Neutrophils Relative % 11/05/2012 58  43 - 77 % Final  . Neutro Abs 11/05/2012 4.7  1.7 - 7.7 K/uL Final  . Lymphocytes Relative 11/05/2012 32  12 - 46 % Final  . Lymphs Abs 11/05/2012 2.5  0.7 - 4.0 K/uL Final  . Monocytes Relative 11/05/2012 7  3 - 12 % Final  . Monocytes Absolute 11/05/2012 0.5  0.1 - 1.0 K/uL Final  . Eosinophils Relative 11/05/2012 2  0 - 5 % Final  . Eosinophils Absolute 11/05/2012 0.2  0.0 - 0.7 K/uL Final  . Basophils Relative 11/05/2012 1  0 - 1 % Final  . Basophils Absolute 11/05/2012 0.0  0.0 - 0.1 K/uL Final  . Smear Review 11/05/2012 Criteria for review not met   Final  . Sodium 11/05/2012 136  135 - 145 mEq/L Final  . Potassium 11/05/2012 4.8  3.5 - 5.3 mEq/L Final  . Chloride 11/05/2012 100  96 - 112 mEq/L Final  . CO2 11/05/2012 25  19 - 32 mEq/L Final  . Glucose, Bld 11/05/2012 113* 70 - 99 mg/dL Final  . BUN 14/78/2956 28* 6 - 23 mg/dL Final  . Creat 21/30/8657 1.24* 0.50 - 1.10 mg/dL Final  . Total Bilirubin 11/05/2012 1.0  0.3 - 1.2 mg/dL Final  . Alkaline Phosphatase 11/05/2012 59  39 - 117 U/L Final  . AST 11/05/2012 18  0 - 37 U/L Final  . ALT 11/05/2012 16  0 - 35 U/L Final  . Total Protein 11/05/2012 7.0  6.0 - 8.3 g/dL Final  . Albumin 84/69/6295 4.3  3.5 - 5.2 g/dL Final  . Calcium 28/41/3244 9.8  8.4 - 10.5 mg/dL Final  . Cholesterol 01/24/7251 134  0 - 200 mg/dL Final   Comment: ATP III Classification:                                < 200        mg/dL        Desirable  200 - 239     mg/dL         Borderline High                               >= 240        mg/dL        High                             . Triglycerides 11/05/2012 133  <150 mg/dL Final  . HDL 16/10/9602 56  >39 mg/dL Final  . Total CHOL/HDL Ratio 11/05/2012 2.4   Final  . VLDL 11/05/2012 27  0 - 40 mg/dL Final  . LDL Cholesterol 11/05/2012 51  0 - 99 mg/dL Final   Comment:                            Total Cholesterol/HDL Ratio:CHD Risk                                                 Coronary Heart Disease Risk Table                                                                 Men       Women                                   1/2 Average Risk              3.4        3.3                                       Average Risk              5.0        4.4                                    2X Average Risk              9.6        7.1                                    3X Average Risk             23.4       11.0                          Use the calculated Patient Ratio above and the CHD Risk table  to determine the patient's CHD Risk.                          ATP III Classification (LDL):                                < 100        mg/dL         Optimal                               100 - 129     mg/dL         Near or Above Optimal                               130 - 159     mg/dL         Borderline High                               160 - 189     mg/dL         High                                > 190        mg/dL         Very High                             . Hemoglobin A1C 11/05/2012 6.0* <5.7 % Final   Comment:                                                                                                 According to the ADA Clinical Practice Recommendations for 2011, when                          HbA1c is used as a screening test:                                                       >=6.5%   Diagnostic of Diabetes Mellitus                                     (if abnormal result is  confirmed)  5.7-6.4%   Increased risk of developing Diabetes Mellitus                                                     References:Diagnosis and Classification of Diabetes Mellitus,Diabetes                          Care,2011,34(Suppl 1):S62-S69 and Standards of Medical Care in                                  Diabetes - 2011,Diabetes Care,2011,34 (Suppl 1):S11-S61.                             . Mean Plasma Glucose 11/05/2012 126* <117 mg/dL Final           Assessment & Plan:  1. Need for prophylactic vaccination and inoculation against influenza - Flu Vaccine QUAD 36+ mos PF IM (Fluarix)  2. Neuropathy Likely due to diabetes, I will check a TSH and vitamin B12. - TSH - Vitamin B12  3. Type II or unspecified type diabetes mellitus without mention of complication, not stated as uncontrolled Well controlled. Continue current medications at present dosages.  4. HTN (hypertension) Well controlled continue current medications at doesn't dosages.  5. HLD (hyperlipidemia) Well controlled. Continue current medications at present dosages.

## 2012-11-12 ENCOUNTER — Encounter: Payer: Self-pay | Admitting: Family Medicine

## 2012-12-08 ENCOUNTER — Telehealth: Payer: Self-pay | Admitting: Family Medicine

## 2012-12-08 MED ORDER — GLUCOSE BLOOD VI STRP: ORAL_STRIP | Status: DC

## 2012-12-08 NOTE — Telephone Encounter (Signed)
Needs to have her one touch ultra refilled     [facility]  [place]

## 2012-12-08 NOTE — Telephone Encounter (Signed)
Rx Refilled  

## 2012-12-21 ENCOUNTER — Other Ambulatory Visit: Payer: Self-pay | Admitting: Family Medicine

## 2012-12-23 ENCOUNTER — Encounter: Payer: Self-pay | Admitting: Family Medicine

## 2012-12-23 ENCOUNTER — Other Ambulatory Visit: Payer: Self-pay | Admitting: Family Medicine

## 2012-12-23 DIAGNOSIS — E119 Type 2 diabetes mellitus without complications: Secondary | ICD-10-CM

## 2012-12-23 NOTE — Progress Notes (Signed)
Our records indicate that patient has not had urine micro albumin done recently.  Pt called and asked to come by office and leave a urine sample so this can be done. Future order place.  Reminder letter also sent.

## 2012-12-26 ENCOUNTER — Other Ambulatory Visit: Payer: Medicare Other

## 2012-12-26 DIAGNOSIS — E119 Type 2 diabetes mellitus without complications: Secondary | ICD-10-CM

## 2013-01-21 ENCOUNTER — Other Ambulatory Visit: Payer: Self-pay | Admitting: Family Medicine

## 2013-02-16 ENCOUNTER — Ambulatory Visit (INDEPENDENT_AMBULATORY_CARE_PROVIDER_SITE_OTHER): Payer: Medicare Other | Admitting: Family Medicine

## 2013-02-16 ENCOUNTER — Encounter: Payer: Self-pay | Admitting: Family Medicine

## 2013-02-16 VITALS — BP 122/70 | HR 82 | Temp 97.9°F | Resp 18 | Ht 61.5 in | Wt 201.0 lb

## 2013-02-16 DIAGNOSIS — K140 Glossitis: Secondary | ICD-10-CM

## 2013-02-16 MED ORDER — NYSTATIN 100000 UNIT/ML MT SUSP: 5.0000 mL | Freq: Four times a day (QID) | OROMUCOSAL | Status: DC

## 2013-02-16 NOTE — Progress Notes (Signed)
Subjective:    Patient ID: CACI ORREN, female    DOB: 1940/02/27, 73 y.o.   MRN: [id number]  HPI At Christmas time, the patient developed an upper respiratory infection with sinus pressure, rhinorrhea, cough, and some throat. The symptoms improved. She went to an urgent care strep test was negative. However, all the other symptoms have improved, she continues to complain of redness and a burning sensation on the tip of her tongue as well as a burning sensation on her soft palate.  She denies any sinus pain. She denies any fever. She denies any sore throat. She denies any coughing. Past Medical History  Diagnosis Date  . Diabetes mellitus without complication   . Hypertension   . Hyperlipidemia   . Diverticulosis    Current Outpatient Prescriptions on File Prior to Visit  Medication Sig Dispense Refill  . aspirin 81 MG tablet Take 81 mg by mouth daily.      . Cholecalciferol (VITAMIN D) 2000 UNITS CAPS Take 2,000 Units by mouth daily.      Marland Kitchen ESTRADIOL [facility] 0.02 % vaginally. 1 ml twice a week (pt has this compounded at [place]) [phone])      . fish oil-omega-3 fatty acids 1000 MG capsule Take 1,200 mg by mouth daily.      Marland Kitchen glucosamine-chondroitin 500-400 MG tablet Take 1 tablet by mouth 3 (three) times daily.      Marland Kitchen glucose blood test strip OneTouch Ultra  - check BS qam  100 each  6  . hydrochlorothiazide (MICROZIDE) 12.5 MG capsule TAKE 1 CAPSULE EVERY DAY  90 capsule  1  . hydrOXYzine (ATARAX/VISTARIL) 10 MG tablet TAKE 1 TABLET BY MOUTH EVERY DAY  90 tablet  3  . losartan (COZAAR) 100 MG tablet TAKE 1 TABLET BY MOUTH EVERY DAY  30 tablet  3  . metFORMIN (GLUCOPHAGE) 1000 MG tablet Take 1 tablet (1,000 mg total) by mouth 2 (two) times daily with a meal.  60 tablet  5  . NIACIN PO Take 400 mg by mouth daily.      Marland Kitchen omeprazole (PRILOSEC) 40 MG capsule TAKE 1 CAPSULE EVERY MORNING  90 capsule  3  . pravastatin (PRAVACHOL) 40 MG tablet TAKE 1 TABLET BY MOUTH AT  BEDTIME  90 tablet  1   No current facility-administered medications on file prior to visit.   Allergies  Allergen Reactions  . Flagyl [Metronidazole] Hives  . Lisinopril     hyperkalemia   History   Social History  . Marital Status: Married    Spouse Name: N/A    Number of Children: N/A  . Years of Education: N/A   Occupational History  . Not on file.   Social History Main Topics  . Smoking status: Never Smoker   . Smokeless tobacco: Not on file  . Alcohol Use: Not on file  . Drug Use: Not on file  . Sexual Activity: Not on file   Other Topics Concern  . Not on file   Social History Narrative  . No narrative on file      Review of Systems  All other systems reviewed and are negative.       Objective:   Physical Exam  Vitals reviewed. HENT:  Right Ear: Tympanic membrane, external ear and ear canal normal.  Left Ear: Tympanic membrane, external ear and ear canal normal.  Nose: Nose normal. No mucosal edema or rhinorrhea.  Mouth/Throat: Uvula is midline, oropharynx is clear and moist  and mucous membranes are normal. No oropharyngeal exudate.  Eyes: Conjunctivae are normal. Pupils are equal, round, and reactive to light. No scleral icterus.  Neck: Normal range of motion. Neck supple.  Cardiovascular: Normal rate.   Pulmonary/Chest: Effort normal and breath sounds normal.  Lymphadenopathy:    She has no cervical adenopathy.  The tip of the patient's tongue is slightly erythematous compared to the remainder of the time. There is edema around the taste buds.  No abnormalities are seen on the roof of the mouth.        Assessment & Plan:  1. Glossitis Differential diagnosis for this includes glossitis due to thrush, burning mouth syndrome, or atypical symptoms of a sinus infection causing the burning sensation on the soft palate.  I will try treating candida associated glossitis first. Nystatin 1 teaspoon gargle and swallow 4 times a day for 7 days recheck in  1 week if no better. - nystatin (MYCOSTATIN) 100000 UNIT/ML suspension; Take 5 mLs (500,000 Units total) by mouth 4 (four) times daily.  Dispense: 60 mL; Refill: 0

## 2013-03-03 ENCOUNTER — Telehealth: Payer: Self-pay | Admitting: Family Medicine

## 2013-03-03 DIAGNOSIS — K140 Glossitis: Secondary | ICD-10-CM

## 2013-03-03 MED ORDER — NYSTATIN 100000 UNIT/ML MT SUSP: 5.0000 mL | Freq: Four times a day (QID) | OROMUCOSAL | Status: DC

## 2013-03-03 NOTE — Telephone Encounter (Signed)
?   OK to Refill  

## 2013-03-03 NOTE — Telephone Encounter (Signed)
Nystatin 5 ml qid for 7 days

## 2013-03-03 NOTE — Telephone Encounter (Signed)
Rx Refilled & Patient aware  

## 2013-03-03 NOTE — Telephone Encounter (Signed)
Call back number is [phone] Pharmacy is [facility]  Pt is needing a refill on the medication for thrush she didn't have the bottle she threw it away

## 2013-03-07 ENCOUNTER — Other Ambulatory Visit: Payer: Self-pay | Admitting: Family Medicine

## 2013-03-07 MED ORDER — GLUCOSE BLOOD VI STRP: ORAL_STRIP | Status: DC

## 2013-03-07 NOTE — Telephone Encounter (Signed)
Rx Refilled  

## 2013-03-10 ENCOUNTER — Other Ambulatory Visit: Payer: Self-pay | Admitting: Family Medicine

## 2013-04-22 ENCOUNTER — Encounter: Payer: Self-pay | Admitting: Family Medicine

## 2013-04-23 ENCOUNTER — Other Ambulatory Visit: Payer: Self-pay | Admitting: Family Medicine

## 2013-04-23 NOTE — Telephone Encounter (Signed)
Refill appropriate and filled per protocol. 

## 2013-04-30 ENCOUNTER — Ambulatory Visit (INDEPENDENT_AMBULATORY_CARE_PROVIDER_SITE_OTHER): Payer: Medicare Other | Admitting: Physician Assistant

## 2013-04-30 ENCOUNTER — Other Ambulatory Visit: Payer: Self-pay | Admitting: Family Medicine

## 2013-04-30 ENCOUNTER — Encounter: Payer: Self-pay | Admitting: Physician Assistant

## 2013-04-30 VITALS — BP 122/84 | HR 80 | Temp 97.6°F | Resp 18 | Ht 61.0 in | Wt 198.0 lb

## 2013-04-30 DIAGNOSIS — R000 Tachycardia, unspecified: Secondary | ICD-10-CM

## 2013-04-30 DIAGNOSIS — Z8249 Family history of ischemic heart disease and other diseases of the circulatory system: Secondary | ICD-10-CM

## 2013-04-30 DIAGNOSIS — E119 Type 2 diabetes mellitus without complications: Secondary | ICD-10-CM

## 2013-04-30 DIAGNOSIS — E785 Hyperlipidemia, unspecified: Secondary | ICD-10-CM

## 2013-04-30 DIAGNOSIS — I10 Essential (primary) hypertension: Secondary | ICD-10-CM

## 2013-04-30 NOTE — Progress Notes (Signed)
Patient ID: ALMADELIA LOOMAN MRN: [id number], DOB: Mar 23, 1940, 73 y.o. Date of Encounter: @DATE @  Chief Complaint:  Chief Complaint  Patient presents with  . feels like heart racing at night    for awhile, denies chest pain, denies any SOB    HPI: 73 y.o. year old female  presents with above complaints.  She reports that recently she's been checking her radial pulse at times and gets it to be 96. This is sometimes this is all she is just doing things during the day. However recently she is able to get in the middle of the night and checked her pulse and gotten it to be 96.  She refers to this as her "heart is racing".  She never feels any palpitations or flutters or heart racing.  She does no exercise but does housework including vacuuming et [place]. Even with this exertion she is feeling or palpitations. With this exertion she is feeling no chest pressure, heaviness, tightness and no shortness of breath or dyspnea.  She is concerned because both of her parents had heart disease.--Assessment plantar details of this.  She had some type of stress test about 10 years ago but otherwise no cardiac evaluation.   Past Medical History  Diagnosis Date  . Diabetes mellitus without complication   . Hypertension   . Hyperlipidemia   . Diverticulosis      Home Meds: See attached medication section for current medication list. Any medications entered into computer today will not appear on this note's list. The medications listed below were entered prior to today. Current Outpatient Prescriptions on File Prior to Visit  Medication Sig Dispense Refill  . aspirin 81 MG tablet Take 81 mg by mouth daily.      . Cholecalciferol (VITAMIN D) 2000 UNITS CAPS Take 2,000 Units by mouth daily.      Marland Kitchen ESTRADIOL [facility] 0.02 % vaginally. 1 ml twice a week (pt has this compounded at [place]) [phone])      . fish oil-omega-3 fatty acids 1000 MG capsule Take 1,200 mg by mouth daily.      Marland Kitchen  glucosamine-chondroitin 500-400 MG tablet Take 1 tablet by mouth 3 (three) times daily.      Marland Kitchen glucose blood test strip OneTouch Ultra Blue  - check BS qam - DX - 250.00  100 each  4  . hydrochlorothiazide (MICROZIDE) 12.5 MG capsule TAKE 1 CAPSULE EVERY DAY  90 capsule  1  . hydrOXYzine (ATARAX/VISTARIL) 10 MG tablet TAKE 1 TABLET BY MOUTH EVERY DAY  90 tablet  3  . losartan (COZAAR) 100 MG tablet TAKE 1 TABLET BY MOUTH EVERY DAY  30 tablet  3  . metFORMIN (GLUCOPHAGE) 1000 MG tablet Take 1 tablet (1,000 mg total) by mouth 2 (two) times daily with a meal.  60 tablet  5  . NIACIN PO Take 400 mg by mouth daily.      Marland Kitchen omeprazole (PRILOSEC) 40 MG capsule TAKE 1 CAPSULE EVERY MORNING  90 capsule  3   No current facility-administered medications on file prior to visit.    Allergies:  Allergies  Allergen Reactions  . Flagyl [Metronidazole] Hives  . Lisinopril     hyperkalemia    History   Social History  . Marital Status: Married    Spouse Name: N/A    Number of Children: N/A  . Years of Education: N/A   Occupational History  . Not on file.   Social History Main Topics  .  Smoking status: Never Smoker   . Smokeless tobacco: Not on file  . Alcohol Use: Not on file  . Drug Use: Not on file  . Sexual Activity: Not on file   Other Topics Concern  . Not on file   Social History Narrative  . No narrative on file    No family history on file.   Review of Systems:  See HPI for pertinent ROS. All other ROS negative.    Physical Exam: Blood pressure 122/84, pulse 80, temperature 97.6 F (36.4 C), temperature source Oral, resp. rate 18, height 5\' 1"  (1.549 m), weight 198 lb (89.812 kg)., Body mass index is 37.43 kg/(m^2). General: Overweight WF. Appears in no acute distress. Neck: Supple. No thyromegaly. No lymphadenopathy. Lungs: Clear bilaterally to auscultation without wheezes, rales, or rhonchi. Breathing is unlabored. Heart: RRR . II/VI murmur at Right 2nd ICS Abdomen:  Soft, non-tender, non-distended with normoactive bowel sounds. No hepatomegaly. No rebound/guarding. No obvious abdominal masses. Musculoskeletal:  Strength and tone normal for age. Extremities/Skin: Warm and dry. No clubbing or cyanosis. No edema. No rashes or suspicious lesions. Neuro: Alert and oriented X 3. Moves all extremities spontaneously. Gait is normal. CNII-XII grossly in tact. Psych:  Responds to questions appropriately with a normal affect.   Twelve-lead EKG shows normal sinus rhythm 80 beats per minute. Nonspecific ST-T changes.  ASSESSMENT AND PLAN:  73 y.o. year old female with  1. Racing heart beat - EKG 12-Lead - Ambulatory referral to Cardiology  2. Family history of coronary artery disease Father died of an MI at age 81. He had no prior diagnosed CAD. Did have diabetes and was a smoker. Mother had CABG at age 35. No prior diagnosed CAD. Patient has no siblings. - Ambulatory referral to Cardiology  3. Diabetes mellitus without complication - Ambulatory referral to Cardiology  4. Hypertension - Ambulatory referral to Cardiology  5. Hyperlipidemia - Ambulatory referral to Cardiology  Given her risk factor profile and her concern we'll send her to cardiology to rule out any underlying significant CAD.  Marin Olp [place], [place], [facility] 04/30/2013 6:19 PM

## 2013-05-05 ENCOUNTER — Ambulatory Visit: Payer: Medicare Other | Admitting: Cardiology

## 2013-05-06 ENCOUNTER — Ambulatory Visit (INDEPENDENT_AMBULATORY_CARE_PROVIDER_SITE_OTHER): Payer: Medicare Other | Admitting: Cardiology

## 2013-05-06 ENCOUNTER — Encounter: Payer: Self-pay | Admitting: Cardiology

## 2013-05-06 VITALS — BP 134/83 | HR 97 | Ht 61.0 in | Wt 195.0 lb

## 2013-05-06 DIAGNOSIS — R011 Cardiac murmur, unspecified: Secondary | ICD-10-CM

## 2013-05-06 DIAGNOSIS — Z136 Encounter for screening for cardiovascular disorders: Secondary | ICD-10-CM

## 2013-05-06 NOTE — Patient Instructions (Signed)
Your physician recommends that you schedule a follow-up appointment in: 1 year with Dr. Branch. You should receive a letter in the mail in 10 months. If you do not receive this letter by February 2016 call our office to schedule this appointment.   Your physician recommends that you continue on your current medications as directed. Please refer to the Current Medication list given to you today.   

## 2013-05-06 NOTE — Progress Notes (Signed)
Clinical Summary Ms. Trainer is a 73 y.o.female seen today as a new patient.    1. Heart rate - checks pulse frequently, often in mid 90s - denies any palpitations, no chest pain. No SOB or DOE. - drinks 2 cups of coffee in AM, occas diet coke. No other caffeine.   2. HTN - does not check at home - compliant with medicine  3. Hyperlipidemia - compliant with pravastatin 10/2012 TC 134 TG 133 HDL 56 LDL 51 Past Medical History  Diagnosis Date  . Diabetes mellitus without complication   . Hypertension   . Hyperlipidemia   . Diverticulosis      Allergies  Allergen Reactions  . Flagyl [Metronidazole] Hives  . Lisinopril     hyperkalemia     Current Outpatient Prescriptions  Medication Sig Dispense Refill  . aspirin 81 MG tablet Take 81 mg by mouth daily.      . Cholecalciferol (VITAMIN D) 2000 UNITS CAPS Take 2,000 Units by mouth daily.      Marland Kitchen ESTRADIOL [facility] 0.02 % vaginally. 1 ml twice a week (pt has this compounded at [place]) [phone])      . fish oil-omega-3 fatty acids 1000 MG capsule Take 1,200 mg by mouth daily.      Marland Kitchen glucosamine-chondroitin 500-400 MG tablet Take 1 tablet by mouth 3 (three) times daily.      Marland Kitchen glucose blood test strip OneTouch Ultra Blue  - check BS qam - DX - 250.00  100 each  4  . hydrochlorothiazide (MICROZIDE) 12.5 MG capsule TAKE 1 CAPSULE EVERY DAY  90 capsule  1  . hydrOXYzine (ATARAX/VISTARIL) 10 MG tablet TAKE 1 TABLET BY MOUTH EVERY DAY  90 tablet  3  . losartan (COZAAR) 100 MG tablet TAKE 1 TABLET BY MOUTH EVERY DAY  30 tablet  3  . metFORMIN (GLUCOPHAGE) 1000 MG tablet Take 1 tablet (1,000 mg total) by mouth 2 (two) times daily with a meal.  60 tablet  5  . NIACIN PO Take 400 mg by mouth daily.      Marland Kitchen omeprazole (PRILOSEC) 40 MG capsule TAKE 1 CAPSULE EVERY MORNING  90 capsule  3  . pravastatin (PRAVACHOL) 40 MG tablet TAKE 1 TABLET BY MOUTH AT BEDTIME  90 tablet  1   No current facility-administered  medications for this visit.     Past Surgical History  Procedure Laterality Date  . Abdominal hysterectomy    . Cholecystectomy    . Bladder tack       Allergies  Allergen Reactions  . Flagyl [Metronidazole] Hives  . Lisinopril     hyperkalemia      No family history on file.   Social History Ms. Mcdonagh reports that she has never smoked. She does not have any smokeless tobacco history on file. Ms. Comrie has no alcohol history on file.   Review of Systems CONSTITUTIONAL: No weight loss, fever, chills, weakness or fatigue.  HEENT: Eyes: No visual loss, blurred vision, double vision or yellow sclerae.No hearing loss, sneezing, congestion, runny nose or sore throat.  SKIN: No rash or itching.  CARDIOVASCULAR:  RESPIRATORY: No shortness of breath, cough or sputum.  GASTROINTESTINAL: No anorexia, nausea, vomiting or diarrhea. No abdominal pain or blood.  GENITOURINARY: No burning on urination, no polyuria NEUROLOGICAL: No headache, dizziness, syncope, paralysis, ataxia, numbness or tingling in the extremities. No change in bowel or bladder control.  MUSCULOSKELETAL: No muscle, back pain, joint pain or stiffness.  LYMPHATICS: No enlarged nodes. No history of splenectomy.  PSYCHIATRIC: No history of depression or anxiety.  ENDOCRINOLOGIC: No reports of sweating, cold or heat intolerance. No polyuria or polydipsia.  Marland Kitchen   Physical Examination p 97 bp 134/83 Wt 195 lbs BMI 37 Gen: resting comfortably, no acute distress HEENT: no scleral icterus, pupils equal round and reactive, no palptable cervical adenopathy,  CV: RRR, 2/6 systolic murmur RUSB, no JVD Resp: Clear to auscultation bilaterally GI: abdomen is soft, non-tender, non-distended, normal bowel sounds, no hepatosplenomegaly MSK: extremities are warm, no edema.  Skin: warm, no rash Neuro:  no focal deficits Psych: appropriate affect   Diagnostic Studies  03/2012 Echo LVEF 47-09%, grade I diastolic  dysfunction, no WMAs.    Assessment and Plan   1. Heart rate - patient concerned that when she checks her heart rate it is often in the mid 90s.Denies any symptmos - counseled patient this heart rate is still considered normal - she is concerned about her family history of CAD. Has not had any symptoms herself. Counseled most important for her is primary prevention with control of her DM, HTN, and HL which her pcp has provided, continue ASA daily  -we are awaiting EKG from pcp, but at this time will not persue further testing at this time  2. HTN - defer management to pcp  3. Hyperlipidemia - defer management to pcp, last panel was at goal.  4. Heart murmur - no significant abnormalities by echo 08/2012, no current symptoms - continue to follow clinically Follow up 1 year      Arnoldo Lenis, M.D., F.A.C.C.

## 2013-05-09 ENCOUNTER — Other Ambulatory Visit: Payer: Self-pay | Admitting: Family Medicine

## 2013-05-13 ENCOUNTER — Encounter (HOSPITAL_COMMUNITY): Payer: Self-pay | Admitting: Emergency Medicine

## 2013-05-13 ENCOUNTER — Inpatient Hospital Stay (HOSPITAL_COMMUNITY)
Admission: EM | Admit: 2013-05-13 | Discharge: 2013-05-19 | DRG: 377 | Disposition: A | Discharge status: Home / Self Care | Payer: Medicare Other | Attending: Internal Medicine | Admitting: Internal Medicine

## 2013-05-13 DIAGNOSIS — Z23 Encounter for immunization: Secondary | ICD-10-CM

## 2013-05-13 DIAGNOSIS — I10 Essential (primary) hypertension: Secondary | ICD-10-CM | POA: Diagnosis present

## 2013-05-13 DIAGNOSIS — D62 Acute posthemorrhagic anemia: Secondary | ICD-10-CM | POA: Diagnosis not present

## 2013-05-13 DIAGNOSIS — G9340 Encephalopathy, unspecified: Secondary | ICD-10-CM | POA: Diagnosis not present

## 2013-05-13 DIAGNOSIS — E872 Acidosis, unspecified: Secondary | ICD-10-CM | POA: Diagnosis present

## 2013-05-13 DIAGNOSIS — K5731 Diverticulosis of large intestine without perforation or abscess with bleeding: Principal | ICD-10-CM | POA: Diagnosis present

## 2013-05-13 DIAGNOSIS — K922 Gastrointestinal hemorrhage, unspecified: Secondary | ICD-10-CM | POA: Diagnosis present

## 2013-05-13 DIAGNOSIS — E119 Type 2 diabetes mellitus without complications: Secondary | ICD-10-CM | POA: Diagnosis present

## 2013-05-13 DIAGNOSIS — Z7982 Long term (current) use of aspirin: Secondary | ICD-10-CM

## 2013-05-13 DIAGNOSIS — Z79899 Other long term (current) drug therapy: Secondary | ICD-10-CM

## 2013-05-13 DIAGNOSIS — Z800 Family history of malignant neoplasm of digestive organs: Secondary | ICD-10-CM

## 2013-05-13 DIAGNOSIS — E785 Hyperlipidemia, unspecified: Secondary | ICD-10-CM | POA: Diagnosis present

## 2013-05-13 NOTE — ED Notes (Signed)
Pt reports bleeding from rectum that started this morning that eased of then picked back up this evening. Bleeding is described as bright red. Pt denies any pain. Pt states that she notices the bleeding when she goes to the bathroom. Pt alert and ambulatory to triage area.

## 2013-05-14 ENCOUNTER — Ambulatory Visit: Payer: Medicare Other | Admitting: Physician Assistant

## 2013-05-14 ENCOUNTER — Encounter (HOSPITAL_COMMUNITY): Payer: Self-pay

## 2013-05-14 ENCOUNTER — Emergency Department (HOSPITAL_COMMUNITY): Payer: Medicare Other

## 2013-05-14 ENCOUNTER — Other Ambulatory Visit: Payer: Self-pay | Admitting: Family Medicine

## 2013-05-14 DIAGNOSIS — E785 Hyperlipidemia, unspecified: Secondary | ICD-10-CM

## 2013-05-14 DIAGNOSIS — E119 Type 2 diabetes mellitus without complications: Secondary | ICD-10-CM

## 2013-05-14 DIAGNOSIS — I10 Essential (primary) hypertension: Secondary | ICD-10-CM

## 2013-05-14 DIAGNOSIS — K922 Gastrointestinal hemorrhage, unspecified: Secondary | ICD-10-CM | POA: Diagnosis present

## 2013-05-14 LAB — COMPREHENSIVE METABOLIC PANEL
ALT: 12 U/L (ref 0–35)
ALT: 15 U/L (ref 0–35)
AST: 16 U/L (ref 0–37)
AST: 23 U/L (ref 0–37)
Albumin: 3 g/dL — ABNORMAL LOW (ref 3.5–5.2)
Albumin: 3.7 g/dL (ref 3.5–5.2)
Alkaline Phosphatase: 61 U/L (ref 39–117)
Alkaline Phosphatase: 69 U/L (ref 39–117)
BILIRUBIN TOTAL: 0.2 mg/dL — AB (ref 0.3–1.2)
BUN: 28 mg/dL — AB (ref 6–23)
BUN: 28 mg/dL — ABNORMAL HIGH (ref 6–23)
CALCIUM: 7.9 mg/dL — AB (ref 8.4–10.5)
CALCIUM: 9.2 mg/dL (ref 8.4–10.5)
CHLORIDE: 104 meq/L (ref 96–112)
CO2: 17 mEq/L — ABNORMAL LOW (ref 19–32)
CO2: 21 mEq/L (ref 19–32)
Chloride: 99 mEq/L (ref 96–112)
Creatinine, Ser: 1.11 mg/dL — ABNORMAL HIGH (ref 0.50–1.10)
Creatinine, Ser: 1.12 mg/dL — ABNORMAL HIGH (ref 0.50–1.10)
GFR calc Af Amer: 56 mL/min — ABNORMAL LOW (ref >90)
GFR calc Af Amer: 57 mL/min — ABNORMAL LOW (ref >90)
GFR calc non Af Amer: 48 mL/min — ABNORMAL LOW (ref >90)
GFR, EST NON AFRICAN AMERICAN: 49 mL/min — AB (ref >90)
Glucose, Bld: 121 mg/dL — ABNORMAL HIGH (ref 70–99)
Glucose, Bld: 217 mg/dL — ABNORMAL HIGH (ref 70–99)
Potassium: 4.2 mEq/L (ref 3.7–5.3)
Potassium: 4.3 mEq/L (ref 3.7–5.3)
SODIUM: 135 meq/L — AB (ref 137–147)
Sodium: 137 mEq/L (ref 137–147)
TOTAL PROTEIN: 7 g/dL (ref 6.0–8.3)
Total Bilirubin: 0.3 mg/dL (ref 0.3–1.2)
Total Protein: 5.3 g/dL — ABNORMAL LOW (ref 6.0–8.3)

## 2013-05-14 LAB — CBC
HCT: 24.2 % — ABNORMAL LOW (ref 36.0–46.0)
HCT: 37.8 % (ref 36.0–46.0)
HEMATOCRIT: 29.8 % — AB (ref 36.0–46.0)
HEMATOCRIT: 26.5 % — AB (ref 36.0–46.0)
HEMATOCRIT: 25.4 % — AB (ref 36.0–46.0)
HEMOGLOBIN: 10.1 g/dL — AB (ref 12.0–15.0)
HEMOGLOBIN: 9.1 g/dL — AB (ref 12.0–15.0)
HEMOGLOBIN: 8.9 g/dL — AB (ref 12.0–15.0)
Hemoglobin: 13.1 g/dL (ref 12.0–15.0)
Hemoglobin: 8.5 g/dL — ABNORMAL LOW (ref 12.0–15.0)
MCH: 30.6 pg (ref 26.0–34.0)
MCH: 30.7 pg (ref 26.0–34.0)
MCH: 31.4 pg (ref 26.0–34.0)
MCH: 31.6 pg (ref 26.0–34.0)
MCH: 31.7 pg (ref 26.0–34.0)
MCHC: 33.6 g/dL (ref 30.0–36.0)
MCHC: 34.3 g/dL (ref 30.0–36.0)
MCHC: 34.7 g/dL (ref 30.0–36.0)
MCHC: 35 g/dL (ref 30.0–36.0)
MCHC: 35.1 g/dL (ref 30.0–36.0)
MCV: 89.5 fL (ref 78.0–100.0)
MCV: 90.1 fL (ref 78.0–100.0)
MCV: 90.3 fL (ref 78.0–100.0)
MCV: 90.6 fL (ref 78.0–100.0)
MCV: 91.1 fL (ref 78.0–100.0)
PLATELETS: 262 10*3/uL (ref 150–400)
Platelets: 248 10*3/uL (ref 150–400)
Platelets: 250 10*3/uL (ref 150–400)
Platelets: 253 10*3/uL (ref 150–400)
Platelets: 318 10*3/uL (ref 150–400)
RBC: 2.68 MIL/uL — AB (ref 3.87–5.11)
RBC: 2.82 MIL/uL — ABNORMAL LOW (ref 3.87–5.11)
RBC: 2.96 MIL/uL — AB (ref 3.87–5.11)
RBC: 3.27 MIL/uL — AB (ref 3.87–5.11)
RBC: 4.17 MIL/uL (ref 3.87–5.11)
RDW: 12.4 % (ref 11.5–15.5)
RDW: 12.4 % (ref 11.5–15.5)
RDW: 12.5 % (ref 11.5–15.5)
RDW: 12.6 % (ref 11.5–15.5)
RDW: 12.9 % (ref 11.5–15.5)
WBC: 10.1 10*3/uL (ref 4.0–10.5)
WBC: 7.3 10*3/uL (ref 4.0–10.5)
WBC: 7.3 10*3/uL (ref 4.0–10.5)
WBC: 8.2 10*3/uL (ref 4.0–10.5)
WBC: 9 10*3/uL (ref 4.0–10.5)

## 2013-05-14 LAB — KETONES, QUALITATIVE: ACETONE BLD: NEGATIVE

## 2013-05-14 LAB — GLUCOSE, CAPILLARY
GLUCOSE-CAPILLARY: 106 mg/dL — AB (ref 70–99)
GLUCOSE-CAPILLARY: 100 mg/dL — AB (ref 70–99)
Glucose-Capillary: 105 mg/dL — ABNORMAL HIGH (ref 70–99)
Glucose-Capillary: 130 mg/dL — ABNORMAL HIGH (ref 70–99)
Glucose-Capillary: 108 mg/dL — ABNORMAL HIGH (ref 70–99)
Glucose-Capillary: 104 mg/dL — ABNORMAL HIGH (ref 70–99)

## 2013-05-14 LAB — MRSA PCR SCREENING: MRSA by PCR: NEGATIVE

## 2013-05-14 LAB — POC OCCULT BLOOD, ED: Fecal Occult Bld: POSITIVE — AB

## 2013-05-14 LAB — ABO/RH: ABO/RH(D): O POS

## 2013-05-14 LAB — LACTIC ACID, PLASMA: Lactic Acid, Venous: 1.3 mmol/L (ref 0.5–2.2)

## 2013-05-14 MED ORDER — SODIUM CHLORIDE 0.9 % IV BOLUS (SEPSIS)
1000.0000 mL | Freq: Once | INTRAVENOUS | Status: AC
  Administered 2013-05-14: 1000 mL via INTRAVENOUS

## 2013-05-14 MED ORDER — ACETAMINOPHEN 325 MG PO TABS
650.0000 mg | ORAL_TABLET | Freq: Four times a day (QID) | ORAL | Status: DC | PRN
  Administered 2013-05-15: 325 mg via ORAL
  Administered 2013-05-15: 650 mg via ORAL
  Administered 2013-05-16: 325 mg via ORAL
  Filled 2013-05-14 (×3): qty 2

## 2013-05-14 MED ORDER — HYDRALAZINE HCL 20 MG/ML IJ SOLN
10.0000 mg | INTRAMUSCULAR | Status: DC | PRN
  Filled 2013-05-14: qty 0.5

## 2013-05-14 MED ORDER — POLYETHYLENE GLYCOL 3350 17 G PO PACK
17.0000 g | PACK | Freq: Three times a day (TID) | ORAL | Status: DC
  Administered 2013-05-14 – 2013-05-16 (×3): 17 g via ORAL
  Filled 2013-05-14 (×12): qty 1

## 2013-05-14 MED ORDER — INSULIN ASPART 100 UNIT/ML ~~LOC~~ SOLN
0.0000 [IU] | Freq: Three times a day (TID) | SUBCUTANEOUS | Status: DC
  Administered 2013-05-14: 1 [IU] via SUBCUTANEOUS
  Administered 2013-05-16: 2 [IU] via SUBCUTANEOUS
  Administered 2013-05-16 – 2013-05-19 (×4): 1 [IU] via SUBCUTANEOUS

## 2013-05-14 MED ORDER — IOHEXOL 300 MG/ML  SOLN
100.0000 mL | Freq: Once | INTRAMUSCULAR | Status: AC | PRN
  Administered 2013-05-14: 100 mL via INTRAVENOUS

## 2013-05-14 MED ORDER — SODIUM CHLORIDE 0.9 % IV SOLN
INTRAVENOUS | Status: AC
  Administered 2013-05-14 – 2013-05-15 (×3): via INTRAVENOUS

## 2013-05-14 MED ORDER — IOHEXOL 300 MG/ML  SOLN
50.0000 mL | Freq: Once | INTRAMUSCULAR | Status: AC | PRN
  Administered 2013-05-14: 50 mL via ORAL

## 2013-05-14 MED ORDER — ONDANSETRON HCL 4 MG PO TABS: 4.0000 mg | ORAL_TABLET | Freq: Four times a day (QID) | ORAL | Status: DC | PRN

## 2013-05-14 MED ORDER — ONDANSETRON HCL 4 MG/2ML IJ SOLN: 4.0000 mg | Freq: Four times a day (QID) | INTRAMUSCULAR | Status: DC | PRN

## 2013-05-14 MED ORDER — PANTOPRAZOLE SODIUM 40 MG IV SOLR
40.0000 mg | Freq: Every day | INTRAVENOUS | Status: DC
  Administered 2013-05-14 – 2013-05-16 (×3): 40 mg via INTRAVENOUS
  Filled 2013-05-14 (×5): qty 40

## 2013-05-14 MED ORDER — PNEUMOCOCCAL VAC POLYVALENT 25 MCG/0.5ML IJ INJ
0.5000 mL | INJECTION | INTRAMUSCULAR | Status: AC
  Administered 2013-05-15: 0.5 mL via INTRAMUSCULAR
  Filled 2013-05-14 (×2): qty 0.5

## 2013-05-14 MED ORDER — ACETAMINOPHEN 650 MG RE SUPP: 650.0000 mg | Freq: Four times a day (QID) | RECTAL | Status: DC | PRN

## 2013-05-14 NOTE — Consult Note (Signed)
[facility] CONSULT Reason for consult: G.I. bleeding  Referring Physician:  [facility]. PCP: Dr. Dennard Schaumann. Primary G.I.: Dr. Caffie Pinto T Woodroof is an 73 y.o. female.  HPI: patient came in to the emergency room after 2 days of painless rectal bleeding. Initially started with blood around the bowel movement and then became frankly bloody. At no time did she have pain and she has not had any preceding melena. She denied taking any NSAIDs. She had colonoscopy by myself about 12 years ago. Results are in storage and not currently available, however patient noted that she had diverticulosis and did not have any polyps. She's not had any problem related to bleeding since that time. She does note her grandmother had colon cancer.   Past Medical History  Diagnosis Date  . Diabetes mellitus without complication   . Hypertension   . Hyperlipidemia   . Diverticulosis     Past Surgical History  Procedure Laterality Date  . Abdominal hysterectomy    . Cholecystectomy    . Bladder tack      History reviewed. No pertinent family history.  Social History:  reports that she has never smoked. She does not have any smokeless tobacco history on file. She reports that she does not drink alcohol or use illicit drugs.  Allergies:  Allergies  Allergen Reactions  . Flagyl [Metronidazole] Hives  . Lisinopril     hyperkalemia    Medications; Prior to Admission medications   Medication Sig Start Date End Date Taking? Authorizing Provider  aspirin 81 MG tablet Take 81 mg by mouth daily.   Yes Historical Provider, MD  beta carotene w/minerals (OCUVITE) tablet Take 1 tablet by mouth daily.   Yes Historical Provider, MD  Cholecalciferol (VITAMIN D) 2000 UNITS CAPS Take 2,000 Units by mouth daily.   Yes Historical Provider, MD  ESTRADIOL [facility] 0.02 % vaginally every 7 (seven) days. 1 ml once a week (pt has this compounded at [place]) [phone])   Yes Historical  Provider, MD  fish oil-omega-3 fatty acids 1000 MG capsule Take 1,200 mg by mouth daily.   Yes Historical Provider, MD  glucosamine-chondroitin 500-400 MG tablet Take 1 tablet by mouth daily.    Yes Historical Provider, MD  hydrochlorothiazide (MICROZIDE) 12.5 MG capsule TAKE 1 CAPSULE EVERY DAY 01/21/13  Yes Susy Frizzle, MD  hydrOXYzine (ATARAX/VISTARIL) 10 MG tablet Take 10 mg by mouth daily.   Yes Historical Provider, MD  losartan (COZAAR) 100 MG tablet Take 100 mg by mouth daily.   Yes Historical Provider, MD  metFORMIN (GLUCOPHAGE) 1000 MG tablet Take 1,000 mg by mouth 2 (two) times daily with a meal.   Yes Historical Provider, MD  Multiple Vitamin (MULTIVITAMIN) tablet Take 1 tablet by mouth daily.   Yes Historical Provider, MD  NIACIN PO Take 400 mg by mouth daily.   Yes Historical Provider, MD  omeprazole (PRILOSEC) 40 MG capsule Take 40 mg by mouth daily.   Yes Historical Provider, MD  pravastatin (PRAVACHOL) 40 MG tablet TAKE 1 TABLET BY MOUTH AT BEDTIME 04/30/13  Yes Susy Frizzle, MD  metFORMIN (GLUCOPHAGE) 1000 MG tablet TAKE 1 TABLET BY MOUTH TWICE A DAY WITH A MEAL 05/14/13   Susy Frizzle, MD   . insulin aspart  0-9 Units Subcutaneous TID WC  . pantoprazole (PROTONIX) IV  40 mg Intravenous Q0600  . [START ON 05/15/2013] pneumococcal 23 valent vaccine  0.5 mL Intramuscular Tomorrow-1000  . polyethylene glycol  17 g  Oral TID   PRN Meds acetaminophen, acetaminophen, hydrALAZINE, ondansetron (ZOFRAN) IV, ondansetron Results for orders placed during the hospital encounter of 05/13/13 (from the past 48 hour(s))  ABO/RH     Status: None   Collection Time    05/13/13 11:48 PM      Result Value Ref Range   ABO/RH(D) O POS    CBC     Status: None   Collection Time    05/14/13 12:02 AM      Result Value Ref Range   WBC 10.1  4.0 - 10.5 K/uL   RBC 4.17  3.87 - 5.11 MIL/uL   Hemoglobin 13.1  12.0 - 15.0 g/dL   HCT 37.8  36.0 - 46.0 %   MCV 90.6  78.0 - 100.0 fL   MCH 31.4   26.0 - 34.0 pg   MCHC 34.7  30.0 - 36.0 g/dL   RDW 12.4  11.5 - 15.5 %   Platelets 318  150 - 400 K/uL  COMPREHENSIVE METABOLIC PANEL     Status: Abnormal   Collection Time    05/14/13 12:02 AM      Result Value Ref Range   Sodium 135 (*) 137 - 147 mEq/L   Potassium 4.2  3.7 - 5.3 mEq/L   Chloride 99  96 - 112 mEq/L   CO2 17 (*) 19 - 32 mEq/L   Glucose, Bld 217 (*) 70 - 99 mg/dL   BUN 28 (*) 6 - 23 mg/dL   Creatinine, Ser 1.12 (*) 0.50 - 1.10 mg/dL   Calcium 9.2  8.4 - 10.5 mg/dL   Total Protein 7.0  6.0 - 8.3 g/dL   Albumin 3.7  3.5 - 5.2 g/dL   AST 23  0 - 37 U/L   ALT 15  0 - 35 U/L   Alkaline Phosphatase 69  39 - 117 U/L   Total Bilirubin 0.3  0.3 - 1.2 mg/dL   GFR calc non Af Amer 48 (*) >90 mL/min   GFR calc Af Amer 56 (*) >90 mL/min   Comment: (NOTE)     The eGFR has been calculated using the CKD EPI equation.     This calculation has not been validated in all clinical situations.     eGFR's persistently <90 mL/min signify possible Chronic Kidney     Disease.  TYPE AND SCREEN     Status: None   Collection Time    05/14/13 12:02 AM      Result Value Ref Range   ABO/RH(D) O POS     Antibody Screen NEG     Sample Expiration 05/17/2013    POC OCCULT BLOOD, ED     Status: Abnormal   Collection Time    05/14/13 12:18 AM      Result Value Ref Range   Fecal Occult Bld POSITIVE (*) NEGATIVE  MRSA PCR SCREENING     Status: None   Collection Time    05/14/13  3:53 AM      Result Value Ref Range   MRSA by PCR NEGATIVE  NEGATIVE   Comment:            The GeneXpert MRSA Assay (FDA     approved for NASAL specimens     only), is one component of a     comprehensive MRSA colonization     surveillance program. It is not     intended to diagnose MRSA     infection nor to guide or  monitor treatment for     MRSA infections.  CBC     Status: Abnormal   Collection Time    05/14/13  4:25 AM      Result Value Ref Range   WBC 9.0  4.0 - 10.5 K/uL   RBC 3.27 (*) 3.87 -  5.11 MIL/uL   Hemoglobin 10.1 (*) 12.0 - 15.0 g/dL   Comment: REPEATED TO VERIFY     DELTA CHECK NOTED   HCT 29.8 (*) 36.0 - 46.0 %   MCV 91.1  78.0 - 100.0 fL   MCH 30.6  26.0 - 34.0 pg   MCHC 33.6  30.0 - 36.0 g/dL   RDW 12.4  11.5 - 15.5 %   Platelets 253  150 - 400 K/uL  LACTIC ACID, PLASMA     Status: None   Collection Time    05/14/13  4:25 AM      Result Value Ref Range   Lactic Acid, Venous 1.3  0.5 - 2.2 mmol/L  COMPREHENSIVE METABOLIC PANEL     Status: Abnormal   Collection Time    05/14/13  4:25 AM      Result Value Ref Range   Sodium 137  137 - 147 mEq/L   Potassium 4.3  3.7 - 5.3 mEq/L   Chloride 104  96 - 112 mEq/L   CO2 21  19 - 32 mEq/L   Glucose, Bld 121 (*) 70 - 99 mg/dL   BUN 28 (*) 6 - 23 mg/dL   Creatinine, Ser 1.11 (*) 0.50 - 1.10 mg/dL   Calcium 7.9 (*) 8.4 - 10.5 mg/dL   Total Protein 5.3 (*) 6.0 - 8.3 g/dL   Albumin 3.0 (*) 3.5 - 5.2 g/dL   AST 16  0 - 37 U/L   ALT 12  0 - 35 U/L   Alkaline Phosphatase 61  39 - 117 U/L   Total Bilirubin 0.2 (*) 0.3 - 1.2 mg/dL   GFR calc non Af Amer 49 (*) >90 mL/min   GFR calc Af Amer 57 (*) >90 mL/min   Comment: (NOTE)     The eGFR has been calculated using the CKD EPI equation.     This calculation has not been validated in all clinical situations.     eGFR's persistently <90 mL/min signify possible Chronic Kidney     Disease.  GLUCOSE, CAPILLARY     Status: Abnormal   Collection Time    05/14/13  4:54 AM      Result Value Ref Range   Glucose-Capillary 106 (*) 70 - 99 mg/dL  GLUCOSE, CAPILLARY     Status: Abnormal   Collection Time    05/14/13  7:55 AM      Result Value Ref Range   Glucose-Capillary 100 (*) 70 - 99 mg/dL   Comment 1 Documented in Chart     Comment 2 Notify RN    CBC     Status: Abnormal   Collection Time    05/14/13  8:20 AM      Result Value Ref Range   WBC 7.3  4.0 - 10.5 K/uL   RBC 2.96 (*) 3.87 - 5.11 MIL/uL   Hemoglobin 9.1 (*) 12.0 - 15.0 g/dL   HCT 26.5 (*) 36.0 - 46.0 %    MCV 89.5  78.0 - 100.0 fL   MCH 30.7  26.0 - 34.0 pg   MCHC 34.3  30.0 - 36.0 g/dL   RDW 12.5  11.5 - 15.5 %  Platelets 262  150 - 400 K/uL  KETONES, QUALITATIVE     Status: None   Collection Time    05/14/13  8:20 AM      Result Value Ref Range   Acetone, Bld NEGATIVE  NEGATIVE    Ct Abdomen Pelvis W Contrast  05/14/2013   CLINICAL DATA:  Lower abdominal pain and rectal bleeding for 1 day. White cell count 10.1. Hemoccult positive stools.  EXAM: CT ABDOMEN AND PELVIS WITH CONTRAST  TECHNIQUE: Multidetector CT imaging of the abdomen and pelvis was performed using the standard protocol following bolus administration of intravenous contrast.  CONTRAST:  190m OMNIPAQUE IOHEXOL 300 MG/ML  SOLN  COMPARISON:  None.  FINDINGS: Infiltration versus atelectasis focally in the lung bases.  Surgical absence of the gallbladder. The liver, spleen, pancreas, adrenal glands, abdominal aorta, and inferior vena cava are unremarkable. Subcentimeter lesions in both kidneys likely representing cysts. No hydronephrosis in either kidney. The stomach, small bowel, and colon are not abnormally distended. Fluid with small air-fluid levels throughout the colon consistent with liquid stool. Diffuse colonic diverticulosis. No wall thickening. No free air or free fluid in the abdomen. Abdominal wall musculature appears intact.  Pelvis: The bladder wall is not abnormally distended. Uterus appears to be surgically absent. No abnormal pelvic mass or lymphadenopathy. No inflammatory changes in the colon. Appendix is not identified. Degenerative changes in the lumbar spine. Spondylolysis with mild spondylolisthesis at L4-5. No destructive bone lesions.  IMPRESSION: No acute process demonstrated in the abdomen or pelvis. Diffusely fluid-filled colon consistent with liquid stool. No colonic wall thickening or inflammatory changes.   Electronically Signed   By: WLucienne CapersM.D.   On: 05/14/2013 02:14               Blood  pressure 125/70, pulse 80, temperature 98.2 F (36.8 C), temperature source Oral, resp. rate 21, height '5\' 1"'  (1.549 m), weight 89.7 kg (197 lb 12 oz), SpO2 100.00%.  Physical exam:   General--WF in no distress Heart--RRR w/o m/g Lungs--clear Abdomen--SNT good BSs   Assessment: 1. Painless hematochezia. Probably diverticular bleed 2. Diabetes 3. History of cholecystectomy in hysterectomy  Plan: 1.We'll go ahead and resume clear liquid diet and add Miralax. When cleaned out we'll likely need another colonoscopy. We'll follow with you.    JWinfield Cunas 05/14/2013, 9:47 AM

## 2013-05-14 NOTE — H&P (Signed)
[facility] History and Physical  [name] NLZ:[id number] DOB: Dec 21, 1940 DOA: 05/13/2013  Referring physician: ER physician. PCP: Olivia Fraction, MD  Chief Complaint: Rectal bleeding.  HPI: [name] is a 73 y.o. female with history of diabetes mellitus, hyperlipidemia, hypertension presents to the ER because of multiple episodes of rectal bleeding. Patient states her symptoms started yesterday morning with blood tinged bowel movement and eventually patient started having frank bloody bowel movement. Denies any abdominal pain nausea vomiting. Has not had any previous similar episodes. In the ER CT abdomen and pelvis only shows fluid-filled colon. Patient was initially tachycardic improved with IV fluids. Patient denies having taken any NSAIDs. Patient takes aspirin prophylactically. Patient has denies any chest pain or shortness of breath or dizziness. Since patient is tachycardic will be admitted to step down.   Review of Systems: As presented in the history of presenting illness, rest negative.  Past Medical History  Diagnosis Date  . Diabetes mellitus without complication   . Hypertension   . Hyperlipidemia   . Diverticulosis    Past Surgical History  Procedure Laterality Date  . Abdominal hysterectomy    . Cholecystectomy    . Bladder tack     Social History:  reports that she has never smoked. She does not have any smokeless tobacco history on file. She reports that she does not drink alcohol or use illicit drugs. Where does patient live home. Can patient participate in ADLs? Yes.  Allergies  Allergen Reactions  . Flagyl [Metronidazole] Hives  . Lisinopril     hyperkalemia    Family History: History reviewed. No pertinent family history.    Prior to Admission medications   Medication Sig Start Date End Date Taking? Authorizing Provider  aspirin 81 MG tablet Take 81 mg by mouth daily.   Yes Historical Provider, MD  beta carotene w/minerals  (OCUVITE) tablet Take 1 tablet by mouth daily.   Yes Historical Provider, MD  Cholecalciferol (VITAMIN D) 2000 UNITS CAPS Take 2,000 Units by mouth daily.   Yes Historical Provider, MD  ESTRADIOL [facility] 0.02 % vaginally every 7 (seven) days. 1 ml once a week (pt has this compounded at [place]) [phone])   Yes Historical Provider, MD  fish oil-omega-3 fatty acids 1000 MG capsule Take 1,200 mg by mouth daily.   Yes Historical Provider, MD  glucosamine-chondroitin 500-400 MG tablet Take 1 tablet by mouth daily.    Yes Historical Provider, MD  hydrochlorothiazide (MICROZIDE) 12.5 MG capsule TAKE 1 CAPSULE EVERY DAY 01/21/13  Yes Olivia Frizzle, MD  hydrOXYzine (ATARAX/VISTARIL) 10 MG tablet Take 10 mg by mouth daily.   Yes Historical Provider, MD  losartan (COZAAR) 100 MG tablet Take 100 mg by mouth daily.   Yes Historical Provider, MD  metFORMIN (GLUCOPHAGE) 1000 MG tablet Take 1,000 mg by mouth 2 (two) times daily with a meal.   Yes Historical Provider, MD  Multiple Vitamin (MULTIVITAMIN) tablet Take 1 tablet by mouth daily.   Yes Historical Provider, MD  NIACIN PO Take 400 mg by mouth daily.   Yes Historical Provider, MD  omeprazole (PRILOSEC) 40 MG capsule Take 40 mg by mouth daily.   Yes Historical Provider, MD  pravastatin (PRAVACHOL) 40 MG tablet TAKE 1 TABLET BY MOUTH AT BEDTIME 04/30/13  Yes Olivia Frizzle, MD    Physical Exam: Filed Vitals:   05/14/13 0230 05/14/13 0245 05/14/13 0300 05/14/13 0315  BP: 123/74 121/67 121/61 125/78  Pulse: 103  Temp:      TempSrc:      Resp: 16 18 13 17   SpO2: 100% 99% 98% 99%     General:  Well-developed and nourished.  Eyes: Anicteric no pallor.  ENT: No discharge from the ears eyes nose mouth.  Neck: No mass felt.  Cardiovascular: S1-S2 heard.  Respiratory: No rhonchi or crepitations.  Abdomen: Soft nontender bowel sounds present. No guarding rigidity.  Skin: No rash.  Musculoskeletal: No  edema.  Psychiatric: Appears normal.  Neurologic: Alert awake oriented to time place and person. Moves all extremities.  Labs on Admission:  Basic Metabolic Panel:  Recent Labs Lab 05/14/13 0002  NA 135*  K 4.2  CL 99  CO2 17*  GLUCOSE 217*  BUN 28*  CREATININE 1.12*  CALCIUM 9.2   Liver Function Tests:  Recent Labs Lab 05/14/13 0002  AST 23  ALT 15  ALKPHOS 69  BILITOT 0.3  PROT 7.0  ALBUMIN 3.7   No results found for this basename: LIPASE, AMYLASE,  in the last 168 hours No results found for this basename: AMMONIA,  in the last 168 hours CBC:  Recent Labs Lab 05/14/13 0002  WBC 10.1  HGB 13.1  HCT 37.8  MCV 90.6  PLT 318   Cardiac Enzymes: No results found for this basename: CKTOTAL, CKMB, CKMBINDEX, TROPONINI,  in the last 168 hours  BNP (last 3 results) No results found for this basename: PROBNP,  in the last 8760 hours CBG: No results found for this basename: GLUCAP,  in the last 168 hours  Radiological Exams on Admission: Ct Abdomen Pelvis W Contrast  05/14/2013   CLINICAL DATA:  Lower abdominal pain and rectal bleeding for 1 day. White cell count 10.1. Hemoccult positive stools.  EXAM: CT ABDOMEN AND PELVIS WITH CONTRAST  TECHNIQUE: Multidetector CT imaging of the abdomen and pelvis was performed using the standard protocol following bolus administration of intravenous contrast.  CONTRAST:  166mL OMNIPAQUE IOHEXOL 300 MG/ML  SOLN  COMPARISON:  None.  FINDINGS: Infiltration versus atelectasis focally in the lung bases.  Surgical absence of the gallbladder. The liver, spleen, pancreas, adrenal glands, abdominal aorta, and inferior vena cava are unremarkable. Subcentimeter lesions in both kidneys likely representing cysts. No hydronephrosis in either kidney. The stomach, small bowel, and colon are not abnormally distended. Fluid with small air-fluid levels throughout the colon consistent with liquid stool. Diffuse colonic diverticulosis. No wall thickening.  No free air or free fluid in the abdomen. Abdominal wall musculature appears intact.  Pelvis: The bladder wall is not abnormally distended. Uterus appears to be surgically absent. No abnormal pelvic mass or lymphadenopathy. No inflammatory changes in the colon. Appendix is not identified. Degenerative changes in the lumbar spine. Spondylolysis with mild spondylolisthesis at L4-5. No destructive bone lesions.  IMPRESSION: No acute process demonstrated in the abdomen or pelvis. Diffusely fluid-filled colon consistent with liquid stool. No colonic wall thickening or inflammatory changes.   Electronically Signed   By: Lucienne Capers M.D.   On: 05/14/2013 02:14    Assessment/Plan Principal Problem:   Lower GI bleed Active Problems:   Diabetes mellitus without complication   Hypertension   Hyperlipidemia   GI bleed   1. GI bleed - most likely lower GI bleed. Patient has been kept n.p.o. Continue with IV fluids. Closely follow CBC type and screen. Consult GI in a.m. Patient is known to Dr. Oletta Lamas. 2. Metabolic acidosis - probably secondary to bleeding. CT abdomen and pelvis does not show any  signs of ischemia. Hydrate and follow metabolic panel. Check lactic acid levels and acetone. 3. Diabetes mellitus - since patient is n.p.o. I have placed patient on sliding-scale coverage. If acetone is positive or if anion gap persists then may need IV insulin. 4. Hypertension - patient is on when necessary IV hydralazine for systolic pressure more than 180. 5. Hyperlipidemia - continue statins when patient can take orally.    Code Status: Full code.  Family Communication: Husband.  Disposition Plan: Admit to inpatient.    [facility] Pager [phone].  If 7PM-7AM, please contact night-coverage [email or web address] Password [facility] 05/14/2013, 3:54 AM

## 2013-05-14 NOTE — Progress Notes (Signed)
CARE MANAGEMENT NOTE 05/14/2013  Patient:  [name], [name]   Account Number:  [id number]  Date Initiated:  05/14/2013  Documentation initiated by:  Tracee Mccreery  Subjective/Objective Assessment:   73 y.o. female who does use nsaids for minor pain control, 2 day hx of rectal bleeding,     Action/Plan:   is indep is adls, lives at home, hx of rectal bleeding, hypertension,metabolic acidosis, hx of dm   Anticipated DC Date:  05/17/2013   Anticipated DC Plan:  HOME/SELF CARE  In-house referral  NA      DC Planning Services  NA      PAC Choice  NA   Choice offered to / List presented to:  NA   DME arranged  NA      DME agency  NA     [facility] arranged  NA      [facility] agency  NA   Status of service:  In process, will continue to follow Medicare Important Message given?  NA - LOS <3 / Initial given by admissions (If response is "NO", the following Medicare IM given date fields will be blank) Date Medicare IM given:   Date Additional Medicare IM given:    Discharge Disposition:    Per UR Regulation:  Reviewed for med. necessity/level of care/duration of stay  If discussed at [place] of Stay Meetings, dates discussed:    Comments:  [id number]/Isabele Lollar Eldridge Dace, [place], [place] [phone] Chart Reviewed for discharge and hospital needs. Discharge needs at time of review: None present will follow for needs. Review of patient progress due on [id number].

## 2013-05-14 NOTE — ED Notes (Addendum)
Pt c/o lower abdominal pain since this morning. Pt got up to go to bathroom and noticed blood coming from her rectum. Pt has continued to have rectal bleeding throughout the day. Pt notices it when going to the bathroom, pt does not have blood pooling in her underwear. A&Ox4. Pt denies lightheadedness, dizziness, weakness. Obvious blood when collecting hemocult

## 2013-05-14 NOTE — ED Notes (Signed)
Pt to BR for x7 bloody stool.

## 2013-05-14 NOTE — ED Notes (Signed)
MD made aware pt had x6 bloody stool at this time after CT and is now leaking dark red blood. New orders placed.

## 2013-05-14 NOTE — ED Notes (Signed)
Pt had x5 large bloody stool at this time, MD made aware, pt ambulatory to bed from BR at this time and taken to CT on stretcher.

## 2013-05-14 NOTE — ED Provider Notes (Signed)
CSN: [id number]     Arrival date & time 05/13/13  2303 History   First MD Initiated Contact with Patient 05/14/13 0011     Chief Complaint  Patient presents with  . Rectal Bleeding     (Consider location/radiation/quality/duration/timing/severity/associated sxs/prior Treatment) HPI 73-year-old female presents to the emergency department with complaint of rectal bleeding.  Patient reports this morning she had a bowel movement mixed with blood.  Throughout the day.  She has had less stool and more blood.  She denies any dizziness or lightheadedness.  I just prior to arrival, she had a large amount of blood in the toilet bowl.  She was concerned as it has not seemed to have decreased, and is actually increased throughout the day.  Patient reports she has history of diverticulitis./Diverticulosis.  No prior history of rectal bleeding.  She reports a lower abdominal crampy type pain.  No nausea or vomiting.  No fevers. Past Medical History  Diagnosis Date  . Diabetes mellitus without complication   . Hypertension   . Hyperlipidemia   . Diverticulosis    Past Surgical History  Procedure Laterality Date  . Abdominal hysterectomy    . Cholecystectomy    . Bladder tack     History reviewed. No pertinent family history. History  Substance Use Topics  . Smoking status: Never Smoker   . Smokeless tobacco: Not on file  . Alcohol Use: Not on file   OB History   Grav Para Term Preterm Abortions TAB SAB Ect Mult Living                 Review of Systems  See History of Present Illness; otherwise all other systems are reviewed and negative   Allergies  Flagyl and Lisinopril  Home Medications   Prior to Admission medications   Medication Sig Start Date End Date Taking? Authorizing Provider  aspirin 81 MG tablet Take 81 mg by mouth daily.   Yes Historical Provider, MD  beta carotene w/minerals (OCUVITE) tablet Take 1 tablet by mouth daily.   Yes Historical Provider, MD  Cholecalciferol  (VITAMIN D) 2000 UNITS CAPS Take 2,000 Units by mouth daily.   Yes Historical Provider, MD  ESTRADIOL [facility] 0.02 % vaginally every 7 (seven) days. 1 ml once a week (pt has this compounded at [place]) [phone])   Yes Historical Provider, MD  fish oil-omega-3 fatty acids 1000 MG capsule Take 1,200 mg by mouth daily.   Yes Historical Provider, MD  glucosamine-chondroitin 500-400 MG tablet Take 1 tablet by mouth daily.    Yes Historical Provider, MD  hydrochlorothiazide (MICROZIDE) 12.5 MG capsule TAKE 1 CAPSULE EVERY DAY 01/21/13  Yes Susy Frizzle, MD  hydrOXYzine (ATARAX/VISTARIL) 10 MG tablet Take 10 mg by mouth daily.   Yes Historical Provider, MD  losartan (COZAAR) 100 MG tablet Take 100 mg by mouth daily.   Yes Historical Provider, MD  metFORMIN (GLUCOPHAGE) 1000 MG tablet Take 1,000 mg by mouth 2 (two) times daily with a meal.   Yes Historical Provider, MD  Multiple Vitamin (MULTIVITAMIN) tablet Take 1 tablet by mouth daily.   Yes Historical Provider, MD  NIACIN PO Take 400 mg by mouth daily.   Yes Historical Provider, MD  omeprazole (PRILOSEC) 40 MG capsule Take 40 mg by mouth daily.   Yes Historical Provider, MD  pravastatin (PRAVACHOL) 40 MG tablet TAKE 1 TABLET BY MOUTH AT BEDTIME 04/30/13  Yes Susy Frizzle, MD   BP 125/80  Pulse 130  Temp(Src) 98.1 F (36.7 C) (Oral)  Resp 21  SpO2 98% Physical Exam  Nursing note and vitals reviewed. Constitutional: She is oriented to person, place, and time. She appears well-developed and well-nourished.  HENT:  Head: Normocephalic and atraumatic.  Nose: Nose normal.  Mouth/Throat: Oropharynx is clear and moist.  Eyes: Conjunctivae and EOM are normal. Pupils are equal, round, and reactive to light.  Neck: Normal range of motion. Neck supple. No JVD present. No tracheal deviation present. No thyromegaly present.  Cardiovascular: Normal rate, regular rhythm, normal heart sounds and intact distal pulses.  Exam reveals no  gallop and no friction rub.   No murmur heard. Pulmonary/Chest: Effort normal and breath sounds normal. No stridor. No respiratory distress. She has no wheezes. She has no rales. She exhibits no tenderness.  Abdominal: Soft. She exhibits no distension and no mass. There is tenderness (mild lower abdominal tenderness, mainly in left lower quadrant). There is no rebound and no guarding.  hyperactive bowel sounds  Genitourinary: Guaiac positive stool.  Musculoskeletal: Normal range of motion. She exhibits no edema and no tenderness.  Lymphadenopathy:    She has no cervical adenopathy.  Neurological: She is alert and oriented to person, place, and time. She exhibits normal muscle tone. Coordination normal.  Skin: Skin is warm and dry. No rash noted. No erythema. No pallor.  Psychiatric: She has a normal mood and affect. Her behavior is normal. Judgment and thought content normal.    ED Course  Procedures (including critical care time) Labs Review Labs Reviewed  COMPREHENSIVE METABOLIC PANEL - Abnormal; Notable for the following:    Sodium 135 (*)    CO2 17 (*)    Glucose, Bld 217 (*)    BUN 28 (*)    Creatinine, Ser 1.12 (*)    GFR calc non Af Amer 48 (*)    GFR calc Af Amer 56 (*)    All other components within normal limits  POC OCCULT BLOOD, ED - Abnormal; Notable for the following:    Fecal Occult Bld POSITIVE (*)    All other components within normal limits  CBC  TYPE AND SCREEN  ABO/RH    Imaging Review Ct Abdomen Pelvis W Contrast  05/14/2013   CLINICAL DATA:  Lower abdominal pain and rectal bleeding for 1 day. White cell count 10.1. Hemoccult positive stools.  EXAM: CT ABDOMEN AND PELVIS WITH CONTRAST  TECHNIQUE: Multidetector CT imaging of the abdomen and pelvis was performed using the standard protocol following bolus administration of intravenous contrast.  CONTRAST:  147mL OMNIPAQUE IOHEXOL 300 MG/ML  SOLN  COMPARISON:  None.  FINDINGS: Infiltration versus atelectasis  focally in the lung bases.  Surgical absence of the gallbladder. The liver, spleen, pancreas, adrenal glands, abdominal aorta, and inferior vena cava are unremarkable. Subcentimeter lesions in both kidneys likely representing cysts. No hydronephrosis in either kidney. The stomach, small bowel, and colon are not abnormally distended. Fluid with small air-fluid levels throughout the colon consistent with liquid stool. Diffuse colonic diverticulosis. No wall thickening. No free air or free fluid in the abdomen. Abdominal wall musculature appears intact.  Pelvis: The bladder wall is not abnormally distended. Uterus appears to be surgically absent. No abnormal pelvic mass or lymphadenopathy. No inflammatory changes in the colon. Appendix is not identified. Degenerative changes in the lumbar spine. Spondylolysis with mild spondylolisthesis at L4-5. No destructive bone lesions.  IMPRESSION: No acute process demonstrated in the abdomen or pelvis. Diffusely fluid-filled colon consistent with liquid stool. No colonic  wall thickening or inflammatory changes.   Electronically Signed   By: Lucienne Capers M.D.   On: 05/14/2013 02:14     Date: 05/14/2013  Rate:117  Rhythm: sinus tachycardia  QRS Axis: left  Intervals: normal  ST/T Wave abnormalities: ST depressions diffusely  Conduction Disutrbances:none  Narrative Interpretation:   Old EKG Reviewed: changes noted    MDM   Final diagnoses:  Lower GI bleeding    73 year old female with increasing rectal bleeding, history of diverticulosis.  H&H at this time is stable.  Patient did present significantly tachycardic. Concern for possible blood loss that is not being reflected in her CBC.  We'll plan for CT scan.  Expect admission to the hospital for monitoring overnight.    Kalman Drape, MD 05/14/13 [phone]

## 2013-05-14 NOTE — Progress Notes (Signed)
PROGRESS NOTE  [name] OAC:[id number] DOB: 08/02/1940 DOA: 05/13/2013 PCP: Odette Fraction, MD  HPI: [name] is a 73 y.o. female with history of diabetes mellitus, hyperlipidemia, hypertension presents to the ER because of multiple episodes of rectal bleeding.  Assessment/Plan: Painless hematochezia  - Hb drop overnight, continue to monitor Q4. Dr. Oletta Lamas saw patient, appreciate input. No need for transfusion yet.   DM - SSI  HTN - hold home meds in the setting of #1   Diet: clears Fluids: NS DVT Prophylaxis: none  Code Status: Full Family Communication: husband bedside  Disposition Plan: inpatient  Consultants:  GI  Procedures:  none   Antibiotics - none  HPI/Subjective: No complaints, feeling well, denies chest pain or breathing difficulties. No dizziness.   Objective: Filed Vitals:   05/14/13 0300 05/14/13 0315 05/14/13 0400 05/14/13 0604  BP: 121/61 125/78 136/75 125/70  Pulse:    80  Temp:   98.2 F (36.8 C)   TempSrc:   Oral   Resp: 13 17 24 21   Height:   5\' 1"  (1.549 m)   Weight:   89.7 kg (197 lb 12 oz)   SpO2: 98% 99% 91% 100%    Intake/Output Summary (Last 24 hours) at 05/14/13 0109 Last data filed at 05/14/13 0600  Gross per 24 hour  Intake 158.33 ml  Output      1 ml  Net 157.33 ml   Filed Weights   05/14/13 0400  Weight: 89.7 kg (197 lb 12 oz)    Exam:   General:  NAD  Cardiovascular: regular rate and rhythm, without MRG  Respiratory: good air movement, clear to auscultation throughout, no wheezing, ronchi or rales  Abdomen: soft, not tender to palpation, positive bowel sounds  MSK: no peripheral edema  Neuro: CN 2-12 grossly intact, MS 5/5 in all 4  Data Reviewed: Basic Metabolic Panel:  Recent Labs Lab 05/14/13 0002 05/14/13 0425  NA 135* 137  K 4.2 4.3  CL 99 104  CO2 17* 21  GLUCOSE 217* 121*  BUN 28* 28*  CREATININE 1.12* 1.11*  CALCIUM 9.2 7.9*   Liver Function Tests:  Recent  Labs Lab 05/14/13 0002 05/14/13 0425  AST 23 16  ALT 15 12  ALKPHOS 69 61  BILITOT 0.3 0.2*  PROT 7.0 5.3*  ALBUMIN 3.7 3.0*   CBC:  Recent Labs Lab 05/14/13 0002 05/14/13 0425  WBC 10.1 9.0  HGB 13.1 10.1*  HCT 37.8 29.8*  MCV 90.6 91.1  PLT 318 253   CBG:  Recent Labs Lab 05/14/13 0454  GLUCAP 106*    Recent Results (from the past 240 hour(s))  MRSA PCR SCREENING     Status: None   Collection Time    05/14/13  3:53 AM      Result Value Ref Range Status   MRSA by PCR NEGATIVE  NEGATIVE Final   Comment:            The GeneXpert MRSA Assay (FDA     approved for NASAL specimens     only), is one component of a     comprehensive MRSA colonization     surveillance program. It is not     intended to diagnose MRSA     infection nor to guide or     monitor treatment for     MRSA infections.     Studies: Ct Abdomen Pelvis W Contrast  05/14/2013   CLINICAL DATA:  Lower abdominal pain and rectal  bleeding for 1 day. White cell count 10.1. Hemoccult positive stools.  EXAM: CT ABDOMEN AND PELVIS WITH CONTRAST  TECHNIQUE: Multidetector CT imaging of the abdomen and pelvis was performed using the standard protocol following bolus administration of intravenous contrast.  CONTRAST:  114mL OMNIPAQUE IOHEXOL 300 MG/ML  SOLN  COMPARISON:  None.  FINDINGS: Infiltration versus atelectasis focally in the lung bases.  Surgical absence of the gallbladder. The liver, spleen, pancreas, adrenal glands, abdominal aorta, and inferior vena cava are unremarkable. Subcentimeter lesions in both kidneys likely representing cysts. No hydronephrosis in either kidney. The stomach, small bowel, and colon are not abnormally distended. Fluid with small air-fluid levels throughout the colon consistent with liquid stool. Diffuse colonic diverticulosis. No wall thickening. No free air or free fluid in the abdomen. Abdominal wall musculature appears intact.  Pelvis: The bladder wall is not abnormally  distended. Uterus appears to be surgically absent. No abnormal pelvic mass or lymphadenopathy. No inflammatory changes in the colon. Appendix is not identified. Degenerative changes in the lumbar spine. Spondylolysis with mild spondylolisthesis at L4-5. No destructive bone lesions.  IMPRESSION: No acute process demonstrated in the abdomen or pelvis. Diffusely fluid-filled colon consistent with liquid stool. No colonic wall thickening or inflammatory changes.   Electronically Signed   By: Lucienne Capers M.D.   On: 05/14/2013 02:14    Scheduled Meds: . insulin aspart  0-9 Units Subcutaneous TID WC  . pantoprazole (PROTONIX) IV  40 mg Intravenous Q0600  . [START ON 05/15/2013] pneumococcal 23 valent vaccine  0.5 mL Intramuscular Tomorrow-1000   Continuous Infusions: . sodium chloride 125 mL/hr at 05/14/13 0444    Principal Problem:   Lower GI bleed Active Problems:   Diabetes mellitus without complication   Hypertension   Hyperlipidemia   GI bleed   Time spent: 25  This note has been created with Surveyor, quantity. Any transcriptional errors are unintentional.   Marzetta Board, MD [facility] Pager [phone]. If 7 PM - 7 AM, please contact night-coverage at [email or web address], password [facility] 05/14/2013, 8:22 AM  LOS: 1 day

## 2013-05-15 ENCOUNTER — Encounter (HOSPITAL_COMMUNITY): Payer: Self-pay

## 2013-05-15 LAB — CBC
HCT: 23.1 % — ABNORMAL LOW (ref 36.0–46.0)
HCT: 19.8 % — ABNORMAL LOW (ref 36.0–46.0)
HEMATOCRIT: 19.6 % — AB (ref 36.0–46.0)
HEMOGLOBIN: 6.8 g/dL — AB (ref 12.0–15.0)
HEMOGLOBIN: 8.1 g/dL — AB (ref 12.0–15.0)
Hemoglobin: 6.9 g/dL — CL (ref 12.0–15.0)
MCH: 31.5 pg (ref 26.0–34.0)
MCH: 31.3 pg (ref 26.0–34.0)
MCH: 31.1 pg (ref 26.0–34.0)
MCHC: 34.7 g/dL (ref 30.0–36.0)
MCHC: 35.1 g/dL (ref 30.0–36.0)
MCHC: 34.8 g/dL (ref 30.0–36.0)
MCV: 90.7 fL (ref 78.0–100.0)
MCV: 89.2 fL (ref 78.0–100.0)
MCV: 89.2 fL (ref 78.0–100.0)
PLATELETS: 243 10*3/uL (ref 150–400)
Platelets: 210 10*3/uL (ref 150–400)
Platelets: 232 10*3/uL (ref 150–400)
RBC: 2.16 MIL/uL — ABNORMAL LOW (ref 3.87–5.11)
RBC: 2.59 MIL/uL — AB (ref 3.87–5.11)
RBC: 2.22 MIL/uL — AB (ref 3.87–5.11)
RDW: 12.9 % (ref 11.5–15.5)
RDW: 14.4 % (ref 11.5–15.5)
RDW: 14.6 % (ref 11.5–15.5)
WBC: 7.5 10*3/uL (ref 4.0–10.5)
WBC: 8.9 10*3/uL (ref 4.0–10.5)
WBC: 6.8 10*3/uL (ref 4.0–10.5)

## 2013-05-15 LAB — GLUCOSE, CAPILLARY
GLUCOSE-CAPILLARY: 110 mg/dL — AB (ref 70–99)
GLUCOSE-CAPILLARY: 90 mg/dL (ref 70–99)
GLUCOSE-CAPILLARY: 148 mg/dL — AB (ref 70–99)
Glucose-Capillary: 108 mg/dL — ABNORMAL HIGH (ref 70–99)
Glucose-Capillary: 121 mg/dL — ABNORMAL HIGH (ref 70–99)

## 2013-05-15 LAB — PREPARE RBC (CROSSMATCH)

## 2013-05-15 MED ORDER — VITAMINS A & D EX OINT
TOPICAL_OINTMENT | CUTANEOUS | Status: AC
  Filled 2013-05-15: qty 5

## 2013-05-15 MED ORDER — HYDROXYZINE HCL 10 MG PO TABS
10.0000 mg | ORAL_TABLET | Freq: Every day | ORAL | Status: DC | PRN
Start: 2013-05-15 — End: 2013-05-19
  Administered 2013-05-15: 10 mg via ORAL
  Filled 2013-05-15 (×2): qty 1

## 2013-05-15 NOTE — Progress Notes (Signed)
[facility] PROGRESS NOTE Subjective patient reports that she went through the night without bowel movement. Feels that her stools are starting to clear up but still some blood.  Objective: Vital signs in last 24 hours: Temp:  [97.5 F (36.4 C)-98.5 F (36.9 C)] 98.2 F (36.8 C) (04/24 0830) Pulse Rate:  [66-103] 96 (04/24 0845) Resp:  [9-24] 20 (04/24 0845) BP: (90-135)/(43-70) 135/68 mmHg (04/24 0845) SpO2:  [95 %-100 %] 99 % (04/24 0845) Last BM Date: 05/14/13  Intake/Output from previous day: 04/23 0701 - 04/24 0700 In: 3032.1 [I.V.:3014.6; Blood:17.5] Out: 1 [Stool:1] Intake/Output this shift:    PE:  Abdomen-- soft nontender  Lab Results:  Recent Labs  05/14/13 0425 05/14/13 0820 05/14/13 1215 05/14/13 1649 05/15/13 0312  WBC 9.0 7.3 7.3 8.2 7.5  HGB 10.1* 9.1* 8.9* 8.5* 6.8*  HCT 29.8* 26.5* 25.4* 24.2* 19.6*  PLT 253 262 250 248 210   BMET  Recent Labs  05/14/13 0002 05/14/13 0425  NA 135* 137  K 4.2 4.3  CL 99 104  CO2 17* 21  CREATININE 1.12* 1.11*   LFT  Recent Labs  05/14/13 0002 05/14/13 0425  PROT 7.0 5.3*  AST 23 16  ALT 15 12  ALKPHOS 69 61  BILITOT 0.3 0.2*   PT/INR No results found for this basename: LABPROT, INR,  in the last 72 hours PANCREAS No results found for this basename: LIPASE,  in the last 72 hours       Studies/Results: Ct Abdomen Pelvis W Contrast  05/14/2013   CLINICAL DATA:  Lower abdominal pain and rectal bleeding for 1 day. White cell count 10.1. Hemoccult positive stools.  EXAM: CT ABDOMEN AND PELVIS WITH CONTRAST  TECHNIQUE: Multidetector CT imaging of the abdomen and pelvis was performed using the standard protocol following bolus administration of intravenous contrast.  CONTRAST:  166mL OMNIPAQUE IOHEXOL 300 MG/ML  SOLN  COMPARISON:  None.  FINDINGS: Infiltration versus atelectasis focally in the lung bases.  Surgical absence of the gallbladder. The liver, spleen, pancreas, adrenal glands,  abdominal aorta, and inferior vena cava are unremarkable. Subcentimeter lesions in both kidneys likely representing cysts. No hydronephrosis in either kidney. The stomach, small bowel, and colon are not abnormally distended. Fluid with small air-fluid levels throughout the colon consistent with liquid stool. Diffuse colonic diverticulosis. No wall thickening. No free air or free fluid in the abdomen. Abdominal wall musculature appears intact.  Pelvis: The bladder wall is not abnormally distended. Uterus appears to be surgically absent. No abnormal pelvic mass or lymphadenopathy. No inflammatory changes in the colon. Appendix is not identified. Degenerative changes in the lumbar spine. Spondylolysis with mild spondylolisthesis at L4-5. No destructive bone lesions.  IMPRESSION: No acute process demonstrated in the abdomen or pelvis. Diffusely fluid-filled colon consistent with liquid stool. No colonic wall thickening or inflammatory changes.   Electronically Signed   By: Lucienne Capers M.D.   On: 05/14/2013 02:14    Medications: I have reviewed the patient's current medications.  Assessment/Plan: 1. Lower G.I. bleeding. Patient receiving transfusion hemoglobin and dropped 6.8 but appears that stools clinically are beginning to clear. Would continue clear liquids and Miralax. It stabilizes overnight, may be able to do colonoscopy this weekend or early next week.   Winfield Cunas. 05/15/2013, 11:51 AM

## 2013-05-15 NOTE — Progress Notes (Signed)
PROGRESS NOTE   JENNELLE PINKSTAFF QQP:[id number] DOB: July 09, 1940 DOA: 05/13/2013 PCP: Odette Fraction, MD    HPI:  [name] is a 73 y.o. female with history of diabetes mellitus, hyperlipidemia, hypertension presents to the ER because of multiple episodes of rectal bleeding.   Assessment/Plan:  Painless hematochezia  - Hb drop overnight, continue to monitor  - plan for colonoscopy in 1-2 days   ABLA - due to #1, seems to have slowed down - required 1U pRBC this morning, repeat CBC   DM - SSI  HTN - hold home meds in the setting of #1 - controlled.  Diet: clears Fluids: NS DVT Prophylaxis: none  Code Status: Full Family Communication: husband bedside  Disposition Plan: inpatient  Consultants:  GI  Procedures:  none   Antibiotics - none  HPI/Subjective: No complaints, feeling well, denies chest pain or breathing difficulties. No dizziness.   Objective: Filed Vitals:   05/15/13 0700 05/15/13 0800 05/15/13 0830 05/15/13 0845  BP: 126/52 122/62 122/70 135/68  Pulse: 94 85 103 96  Temp:  98.3 F (36.8 C) 98.2 F (36.8 C)   TempSrc:  Oral    Resp: 17 12 23 20   Height:      Weight:      SpO2: 100% 100% 100% 99%    Intake/Output Summary (Last 24 hours) at 05/15/13 1034 Last data filed at 05/15/13 7124  Gross per 24 hour  Intake 2532.08 ml  Output      0 ml  Net 2532.08 ml   Filed Weights   05/14/13 0400  Weight: 89.7 kg (197 lb 12 oz)    Exam:  General:  NAD  Cardiovascular: regular rate and rhythm, without MRG  Respiratory: good air movement, clear to auscultation throughout, no wheezing, ronchi or rales  Abdomen: soft, not tender to palpation, positive bowel sounds  MSK: no peripheral edema  Neuro: CN 2-12 grossly intact, MS 5/5 in all 4  Data Reviewed: Basic Metabolic Panel:  Recent Labs Lab 05/14/13 0002 05/14/13 0425  NA 135* 137  K 4.2 4.3  CL 99 104  CO2 17* 21  GLUCOSE 217* 121*  BUN 28* 28*  CREATININE  1.12* 1.11*  CALCIUM 9.2 7.9*   Liver Function Tests:  Recent Labs Lab 05/14/13 0002 05/14/13 0425  AST 23 16  ALT 15 12  ALKPHOS 69 61  BILITOT 0.3 0.2*  PROT 7.0 5.3*  ALBUMIN 3.7 3.0*   CBC:  Recent Labs Lab 05/14/13 0425 05/14/13 0820 05/14/13 1215 05/14/13 1649 05/15/13 0312  WBC 9.0 7.3 7.3 8.2 7.5  HGB 10.1* 9.1* 8.9* 8.5* 6.8*  HCT 29.8* 26.5* 25.4* 24.2* 19.6*  MCV 91.1 89.5 90.1 90.3 90.7  PLT 253 262 250 248 210   CBG:  Recent Labs Lab 05/14/13 1640 05/14/13 1939 05/14/13 2309 05/15/13 0354 05/15/13 0751  GLUCAP 130* 108* 104* 108* 121*    Recent Results (from the past 240 hour(s))  MRSA PCR SCREENING     Status: None   Collection Time    05/14/13  3:53 AM      Result Value Ref Range Status   MRSA by PCR NEGATIVE  NEGATIVE Final   Comment:            The GeneXpert MRSA Assay (FDA     approved for NASAL specimens     only), is one component of a     comprehensive MRSA colonization     surveillance program. It is not  intended to diagnose MRSA     infection nor to guide or     monitor treatment for     MRSA infections.     Studies: Ct Abdomen Pelvis W Contrast  05/14/2013   CLINICAL DATA:  Lower abdominal pain and rectal bleeding for 1 day. White cell count 10.1. Hemoccult positive stools.  EXAM: CT ABDOMEN AND PELVIS WITH CONTRAST  TECHNIQUE: Multidetector CT imaging of the abdomen and pelvis was performed using the standard protocol following bolus administration of intravenous contrast.  CONTRAST:  157mL OMNIPAQUE IOHEXOL 300 MG/ML  SOLN  COMPARISON:  None.  FINDINGS: Infiltration versus atelectasis focally in the lung bases.  Surgical absence of the gallbladder. The liver, spleen, pancreas, adrenal glands, abdominal aorta, and inferior vena cava are unremarkable. Subcentimeter lesions in both kidneys likely representing cysts. No hydronephrosis in either kidney. The stomach, small bowel, and colon are not abnormally distended. Fluid with  small air-fluid levels throughout the colon consistent with liquid stool. Diffuse colonic diverticulosis. No wall thickening. No free air or free fluid in the abdomen. Abdominal wall musculature appears intact.  Pelvis: The bladder wall is not abnormally distended. Uterus appears to be surgically absent. No abnormal pelvic mass or lymphadenopathy. No inflammatory changes in the colon. Appendix is not identified. Degenerative changes in the lumbar spine. Spondylolysis with mild spondylolisthesis at L4-5. No destructive bone lesions.  IMPRESSION: No acute process demonstrated in the abdomen or pelvis. Diffusely fluid-filled colon consistent with liquid stool. No colonic wall thickening or inflammatory changes.   Electronically Signed   By: Lucienne Capers M.D.   On: 05/14/2013 02:14    Scheduled Meds: . insulin aspart  0-9 Units Subcutaneous TID WC  . pantoprazole (PROTONIX) IV  40 mg Intravenous Q0600  . pneumococcal 23 valent vaccine  0.5 mL Intramuscular Tomorrow-1000  . polyethylene glycol  17 g Oral TID  . vitamin A & D       Continuous Infusions:  Principal Problem:   Lower GI bleed Active Problems:   Diabetes mellitus without complication   Hypertension   Hyperlipidemia   GI bleed   Time spent: 25   This note has been created with Surveyor, quantity. Any transcriptional errors are unintentional.   Marzetta Board, MD [facility] Pager [phone]. If 7 PM - 7 AM, please contact night-coverage at [email or web address], password [facility] 05/15/2013, 10:34 AM  LOS: 2 days

## 2013-05-15 NOTE — Progress Notes (Signed)
Pt assisted to the bathroom at about 2030, pt had a large bloody stool, reported feeling light headed and faint. HR in 140s, BP 109/50 (62), oxygen saturation 100% on room air. Pt was assisted back to bed using a wheel chair. [facility] night coverage was notified, order for a stat CBC was received. Pt currently in bed resting, verbalized feeling better, no sign of distress noted.

## 2013-05-15 NOTE — Progress Notes (Signed)
CRITICAL VALUE ALERT  Critical value received:  hgb 6.9  Date of notification:  05/15/13  Time of notification:  2113  Critical value read back:yes  Nurse who received alert:  Betsy Coder RN  MD notified (1st page):  Walden Field NP  Time of first page:  2113  MD notified (2nd page):  Time of second page:  Responding MD:  Walden Field NP  Time MD responded:  2117

## 2013-05-15 NOTE — Clinical Documentation Improvement (Signed)
05/15/13  Please clarify. Thank you.  Possible Clinical Conditions?    Expected Acute Blood Loss Anemia  Acute Blood Loss Anemia  Acute on chronic blood loss anemia  Chronic blood loss anemia  Precipitous drop in Hematocrit  Other Condition________________  Cannot Clinically Determine   Supporting Information: Risk Factors:  Admitted with GI Bleeding Probably diverticular bleed History of Diverticulosis   Signs and Symptoms  Blood in stools  Diagnostics: H&H on admit: 4/23@0002  =13.1/37.8 4/23@0425  = 10.1/29.8 4/23@0820  = 9.1/26.5 4/23@1215  = 8.9/25.4 4/23@1649  = 8.5/24.2 4/24@0312  = 6.8/19.6  Treatments: Transfusion: 1 unit PRBC ordered IV fluids: NS@125ml /h H&H monitoring I&O qshift VS qshift  Thank You, Estella Husk ,RN Clinical Documentation Specialist:  [employer]

## 2013-05-16 ENCOUNTER — Inpatient Hospital Stay (HOSPITAL_COMMUNITY): Payer: Medicare Other

## 2013-05-16 LAB — CBC
HCT: 23.9 % — ABNORMAL LOW (ref 36.0–46.0)
HCT: 22 % — ABNORMAL LOW (ref 36.0–46.0)
Hemoglobin: 8.2 g/dL — ABNORMAL LOW (ref 12.0–15.0)
Hemoglobin: 7.6 g/dL — ABNORMAL LOW (ref 12.0–15.0)
MCH: 29.9 pg (ref 26.0–34.0)
MCH: 30.2 pg (ref 26.0–34.0)
MCHC: 34.3 g/dL (ref 30.0–36.0)
MCHC: 34.5 g/dL (ref 30.0–36.0)
MCV: 87.2 fL (ref 78.0–100.0)
MCV: 87.3 fL (ref 78.0–100.0)
PLATELETS: 181 10*3/uL (ref 150–400)
PLATELETS: 204 10*3/uL (ref 150–400)
RBC: 2.74 MIL/uL — ABNORMAL LOW (ref 3.87–5.11)
RBC: 2.52 MIL/uL — ABNORMAL LOW (ref 3.87–5.11)
RDW: 14.7 % (ref 11.5–15.5)
RDW: 15 % (ref 11.5–15.5)
WBC: 8.5 10*3/uL (ref 4.0–10.5)
WBC: 8 10*3/uL (ref 4.0–10.5)

## 2013-05-16 LAB — GLUCOSE, CAPILLARY
GLUCOSE-CAPILLARY: 90 mg/dL (ref 70–99)
Glucose-Capillary: 137 mg/dL — ABNORMAL HIGH (ref 70–99)
Glucose-Capillary: 141 mg/dL — ABNORMAL HIGH (ref 70–99)
Glucose-Capillary: 123 mg/dL — ABNORMAL HIGH (ref 70–99)
Glucose-Capillary: 177 mg/dL — ABNORMAL HIGH (ref 70–99)

## 2013-05-16 LAB — PREPARE RBC (CROSSMATCH)

## 2013-05-16 MED ORDER — TECHNETIUM TC 99M-LABELED RED BLOOD CELLS IV KIT
22.0000 | PACK | Freq: Once | INTRAVENOUS | Status: AC | PRN
  Administered 2013-05-16: 24 via INTRAVENOUS

## 2013-05-16 NOTE — Progress Notes (Addendum)
[name] 9:58 AM  Subjective: Patient's last bowel movement was bright red blood but that was at 8 PM she says but feels like she has to go this morning and she's not having any pain but does not like taking the MiraLax and we discussed gently cleaning her out in case we need to do a colonoscopy it will be easier and safer and I discussed her case with the hospital team and she has no complaints otherwise and we discussed her last colonoscopy Objective: Vital signs stable afebrile no acute distress abdomen is soft nontender hemoglobin increased status post transfusion  Assessment: Probable diverticular bleeding  Plan: Will proceed with a nuclear bleeding scan now and if positive consider interventional radiology for possible coils and otherwise will consider colonoscopy soon and continue clear liquids for now and 3 times a day MiraLax seems reasonable  and the risks of colonoscopy was discussed with the patient  Jeryl Columbia

## 2013-05-16 NOTE — Progress Notes (Signed)
Pt expressed concern that ordered Miralax is making her GI bleed worse, Dr on call for GI notified, order received to hold 2200 dose of Miralax . Rounding MD will address this concern in the morning.

## 2013-05-16 NOTE — Progress Notes (Addendum)
PROGRESS NOTE   [name] YKD:[id number] DOB: 1940/05/30 DOA: 05/13/2013 PCP: Odette Fraction, MD    HPI:  [name] is a 73 y.o. female with history of diabetes mellitus, hyperlipidemia, hypertension presents to the ER because of multiple episodes of rectal bleeding.   Assessment/Plan:  Painless hematochezia  - Hb continues to drop, STAT NM GI Bleeding scan today. Discussed with Dr. Watt Climes.  - patient is unreliable and nursing staff reports intermittent confusion. She denies bloody bowel movements but overnight there were charted 2 large bloody BMs which are consistent with her Hb drop.   ABLA - due to #1 - required total of 3U pRBC - continue to monitor.   Acute encephalopathy - on and off, patient reports some confusion overnight, patient clear this morning but continue to deny bloody BMs.   DM - SSI  HTN - hold home meds in the setting of #1 - controlled.   Diet: clears Fluids: NS DVT Prophylaxis: none  Code Status: Full Family Communication: husband bedside  Disposition Plan: inpatient  Consultants:  GI  Procedures:  none   Antibiotics - none  HPI/Subjective: - she complains of inability to sleep but denies chest pain, SOB, abdominal pain. Denies rectal bleeding.   Objective: Filed Vitals:   05/16/13 0158 05/16/13 0257 05/16/13 0339 05/16/13 0433  BP: 118/72 126/69 139/66 131/68  Pulse: 93 95 100 83  Temp: 98.4 F (36.9 C)  98.1 F (36.7 C)   TempSrc: Oral  Oral   Resp: 13 12 16 14   Height:      Weight:      SpO2: 96% 96% 98% 100%    Intake/Output Summary (Last 24 hours) at 05/16/13 0837 Last data filed at 05/16/13 3976  Gross per 24 hour  Intake 1099.17 ml  Output      0 ml  Net 1099.17 ml   Filed Weights   05/14/13 0400  Weight: 89.7 kg (197 lb 12 oz)    Exam:  General:  NAD  Cardiovascular: regular rate and rhythm, without MRG  Respiratory: good air movement, clear to auscultation throughout, no wheezing,  ronchi or rales  Abdomen: soft, not tender to palpation, positive bowel sounds  MSK: no peripheral edema  Neuro: non focal  Data Reviewed: Basic Metabolic Panel:  Recent Labs Lab 05/14/13 0002 05/14/13 0425  NA 135* 137  K 4.2 4.3  CL 99 104  CO2 17* 21  GLUCOSE 217* 121*  BUN 28* 28*  CREATININE 1.12* 1.11*  CALCIUM 9.2 7.9*   Liver Function Tests:  Recent Labs Lab 05/14/13 0002 05/14/13 0425  AST 23 16  ALT 15 12  ALKPHOS 69 61  BILITOT 0.3 0.2*  PROT 7.0 5.3*  ALBUMIN 3.7 3.0*   CBC:  Recent Labs Lab 05/14/13 1649 05/15/13 0312 05/15/13 1313 05/15/13 2056 05/16/13 0633  WBC 8.2 7.5 8.9 6.8 8.5  HGB 8.5* 6.8* 8.1* 6.9* 8.2*  HCT 24.2* 19.6* 23.1* 19.8* 23.9*  MCV 90.3 90.7 89.2 89.2 87.2  PLT 248 210 243 232 181   CBG:  Recent Labs Lab 05/15/13 1203 05/15/13 1603 05/15/13 2035 05/16/13 0036 05/16/13 0412  GLUCAP 110* 90 148* 137* 141*    Recent Results (from the past 240 hour(s))  MRSA PCR SCREENING     Status: None   Collection Time    05/14/13  3:53 AM      Result Value Ref Range Status   MRSA by PCR NEGATIVE  NEGATIVE Final   Comment:  The GeneXpert MRSA Assay (FDA     approved for NASAL specimens     only), is one component of a     comprehensive MRSA colonization     surveillance program. It is not     intended to diagnose MRSA     infection nor to guide or     monitor treatment for     MRSA infections.     Studies: No results found.  Scheduled Meds: . insulin aspart  0-9 Units Subcutaneous TID WC  . pantoprazole (PROTONIX) IV  40 mg Intravenous Q0600  . polyethylene glycol  17 g Oral TID   Continuous Infusions:  Principal Problem:   Lower GI bleed Active Problems:   Diabetes mellitus without complication   Hypertension   Hyperlipidemia   GI bleed   Time spent: 35   This note has been created with Surveyor, quantity. Any transcriptional errors are  unintentional.   Marzetta Board, MD [facility] Pager [phone]. If 7 PM - 7 AM, please contact night-coverage at [email or web address], password [facility] 05/16/2013, 8:37 AM  LOS: 3 days

## 2013-05-16 NOTE — Progress Notes (Deleted)
Charted that patient received second IV site; placed by S. Aleene Davidson, Therapist, sports. This was charted on the wrong patient and corrected immediately.

## 2013-05-17 LAB — TYPE AND SCREEN
ABO/RH(D): O POS
ANTIBODY SCREEN: NEGATIVE
UNIT DIVISION: 0
UNIT DIVISION: 0
Unit division: 0
Unit division: 0
Unit division: 0

## 2013-05-17 LAB — CBC
HCT: 25.3 % — ABNORMAL LOW (ref 36.0–46.0)
HCT: 27.1 % — ABNORMAL LOW (ref 36.0–46.0)
HEMOGLOBIN: 9.5 g/dL — AB (ref 12.0–15.0)
Hemoglobin: 8.9 g/dL — ABNORMAL LOW (ref 12.0–15.0)
MCH: 30.8 pg (ref 26.0–34.0)
MCH: 30.4 pg (ref 26.0–34.0)
MCHC: 35.2 g/dL (ref 30.0–36.0)
MCHC: 35.1 g/dL (ref 30.0–36.0)
MCV: 87.5 fL (ref 78.0–100.0)
MCV: 86.6 fL (ref 78.0–100.0)
PLATELETS: 166 10*3/uL (ref 150–400)
Platelets: 236 10*3/uL (ref 150–400)
RBC: 2.89 MIL/uL — ABNORMAL LOW (ref 3.87–5.11)
RBC: 3.13 MIL/uL — AB (ref 3.87–5.11)
RDW: 14.8 % (ref 11.5–15.5)
RDW: 15.2 % (ref 11.5–15.5)
WBC: 6.6 10*3/uL (ref 4.0–10.5)
WBC: 8.4 10*3/uL (ref 4.0–10.5)

## 2013-05-17 LAB — COMPREHENSIVE METABOLIC PANEL
ALT: 11 U/L (ref 0–35)
AST: 16 U/L (ref 0–37)
Albumin: 2.2 g/dL — ABNORMAL LOW (ref 3.5–5.2)
Alkaline Phosphatase: 35 U/L — ABNORMAL LOW (ref 39–117)
BUN: 11 mg/dL (ref 6–23)
CO2: 22 meq/L (ref 19–32)
CREATININE: 0.91 mg/dL (ref 0.50–1.10)
Calcium: 7.1 mg/dL — ABNORMAL LOW (ref 8.4–10.5)
Chloride: 112 mEq/L (ref 96–112)
GFR calc Af Amer: 72 mL/min — ABNORMAL LOW (ref >90)
GFR, EST NON AFRICAN AMERICAN: 62 mL/min — AB (ref >90)
GLUCOSE: 106 mg/dL — AB (ref 70–99)
Potassium: 3.5 mEq/L — ABNORMAL LOW (ref 3.7–5.3)
Sodium: 142 mEq/L (ref 137–147)
Total Bilirubin: 0.7 mg/dL (ref 0.3–1.2)
Total Protein: 4 g/dL — ABNORMAL LOW (ref 6.0–8.3)

## 2013-05-17 LAB — GLUCOSE, CAPILLARY
GLUCOSE-CAPILLARY: 118 mg/dL — AB (ref 70–99)
GLUCOSE-CAPILLARY: 116 mg/dL — AB (ref 70–99)
Glucose-Capillary: 106 mg/dL — ABNORMAL HIGH (ref 70–99)
Glucose-Capillary: 108 mg/dL — ABNORMAL HIGH (ref 70–99)
Glucose-Capillary: 153 mg/dL — ABNORMAL HIGH (ref 70–99)
Glucose-Capillary: 97 mg/dL (ref 70–99)

## 2013-05-17 MED ORDER — PANTOPRAZOLE SODIUM 40 MG PO TBEC
40.0000 mg | DELAYED_RELEASE_TABLET | Freq: Every day | ORAL | Status: DC
Start: 2013-05-17 — End: 2013-05-19
  Administered 2013-05-17 – 2013-05-19 (×3): 40 mg via ORAL
  Filled 2013-05-17 (×3): qty 1

## 2013-05-17 MED ORDER — POLYETHYLENE GLYCOL 3350 17 G PO PACK
17.0000 g | PACK | Freq: Every day | ORAL | Status: AC
  Administered 2013-05-17 (×5): 17 g via ORAL
  Filled 2013-05-17 (×5): qty 1

## 2013-05-17 MED ORDER — MAGNESIUM CITRATE PO SOLN
1.0000 | Freq: Once | ORAL | Status: AC
  Administered 2013-05-17: 1 via ORAL
  Filled 2013-05-17: qty 296

## 2013-05-17 NOTE — Progress Notes (Signed)
PROGRESS NOTE   [name] GMW:[id number] DOB: Jan 28, 1940 DOA: 05/13/2013 PCP: Odette Fraction, MD    HPI:  [name] is a 73 y.o. female with history of diabetes mellitus, hyperlipidemia, hypertension presents to the ER because of multiple episodes of rectal bleeding.   Assessment/Plan:  Painless hematochezia  - Hb continues to drop,  NM GI Bleeding scan 4/25 negative. Discussed with Dr. Watt Climes.  - charted bloody bowel movements last night 10 pm and this morning 7 am.  - clinically patient is experiencing ongoing bleeding, I appreciate GI input   ABLA - due to #1 - required total of 5U pRBC  Acute encephalopathy - on and off, patient reports some confusion overnight, patient clear this morning but continue to deny bloody BMs.   DM - SSI  HTN - hold home meds in the setting of #1 - controlled.   Diet: clears Fluids: NS DVT Prophylaxis: none  Code Status: Full Family Communication: husband bedside  Disposition Plan: inpatient  Consultants:  GI  Procedures:  none   Antibiotics - none  HPI/Subjective: - she complains of inability to sleep but denies chest pain, SOB, abdominal pain. Denies rectal bleeding.   Objective: Filed Vitals:   05/16/13 2200 05/16/13 2201 05/16/13 2348 05/17/13 0354  BP: 121/56 121/56 122/53 129/75  Pulse: 75 72 72 77  Temp:   98.3 F (36.8 C) 97.6 F (36.4 C)  TempSrc:   Oral   Resp: 11 14 15 16   Height:      Weight:    91.4 kg (201 lb 8 oz)  SpO2: 100% 99% 100% 100%    Intake/Output Summary (Last 24 hours) at 05/17/13 0715 Last data filed at 05/17/13 0350  Gross per 24 hour  Intake   1340 ml  Output      0 ml  Net   1340 ml   Filed Weights   05/14/13 0400 05/17/13 0354  Weight: 89.7 kg (197 lb 12 oz) 91.4 kg (201 lb 8 oz)    Exam:  General:  NAD  Cardiovascular: regular rate and rhythm, without MRG  Respiratory: good air movement, clear to auscultation throughout, no wheezing, ronchi or  rales  Abdomen: soft, not tender to palpation, positive bowel sounds  MSK: no peripheral edema  Neuro: non focal  Data Reviewed: Basic Metabolic Panel:  Recent Labs Lab 05/14/13 0002 05/14/13 0425 05/17/13 0346  NA 135* 137 142  K 4.2 4.3 3.5*  CL 99 104 112  CO2 17* 21 22  GLUCOSE 217* 121* 106*  BUN 28* 28* 11  CREATININE 1.12* 1.11* 0.91  CALCIUM 9.2 7.9* 7.1*   Liver Function Tests:  Recent Labs Lab 05/14/13 0002 05/14/13 0425 05/17/13 0346  AST 23 16 16   ALT 15 12 11   ALKPHOS 69 61 35*  BILITOT 0.3 0.2* 0.7  PROT 7.0 5.3* 4.0*  ALBUMIN 3.7 3.0* 2.2*   CBC:  Recent Labs Lab 05/15/13 1313 05/15/13 2056 05/16/13 0633 05/16/13 1405 05/17/13 0346  WBC 8.9 6.8 8.5 8.0 6.6  HGB 8.1* 6.9* 8.2* 7.6* 8.9*  HCT 23.1* 19.8* 23.9* 22.0* 25.3*  MCV 89.2 89.2 87.2 87.3 87.5  PLT 243 232 181 204 166   CBG:  Recent Labs Lab 05/16/13 0745 05/16/13 1605 05/16/13 2108 05/16/13 2348 05/17/13 0352  GLUCAP 123* 177* 90 108* 97    Recent Results (from the past 240 hour(s))  MRSA PCR SCREENING     Status: None   Collection Time  05/14/13  3:53 AM      Result Value Ref Range Status   MRSA by PCR NEGATIVE  NEGATIVE Final   Comment:            The GeneXpert MRSA Assay (FDA     approved for NASAL specimens     only), is one component of a     comprehensive MRSA colonization     surveillance program. It is not     intended to diagnose MRSA     infection nor to guide or     monitor treatment for     MRSA infections.     Studies: Nm Gi Blood Loss  05/16/2013   CLINICAL DATA:  Three day history of bleeding. Most recent bloody bowel movement 3 hr ago.  EXAM: NUCLEAR MEDICINE GASTROINTESTINAL BLEEDING SCAN  TECHNIQUE: Sequential abdominal images were obtained following intravenous administration of Tc-25m labeled red blood cells.  RADIOPHARMACEUTICALS:  24MILLI CURIE ULTRATAG TECHNETIUM TC 30M-LABELED RED BLOOD CELLS IV KITmCi Tc-65m in-vitro labeled red  cells.  COMPARISON:  CT abdomen/ pelvis 05/14/2013  FINDINGS: Static imaging for 120 min demonstrates no focal accumulation of radiotracer within the small or large bowel to suggest active gastrointestinal bleeding. Normal accumulation of radiotracer in the urinary bladder. Normal blood pool uptake and hepatic uptake.  IMPRESSION: Negative bleeding study.   Electronically Signed   By: Jacqulynn Cadet M.D.   On: 05/16/2013 13:59    Scheduled Meds: . insulin aspart  0-9 Units Subcutaneous TID WC  . pantoprazole (PROTONIX) IV  40 mg Intravenous Q0600  . polyethylene glycol  17 g Oral TID   Continuous Infusions:  Principal Problem:   Lower GI bleed Active Problems:   Diabetes mellitus without complication   Hypertension   Hyperlipidemia   GI bleed   Time spent: 25   This note has been created with Surveyor, quantity. Any transcriptional errors are unintentional.   Marzetta Board, MD [facility] Pager [phone]. If 7 PM - 7 AM, please contact night-coverage at [email or web address], password [facility] 05/17/2013, 7:15 AM  LOS: 4 days

## 2013-05-17 NOTE — Progress Notes (Signed)
[name] 10:04 AM  Subjective: Patient with only a few bowel movements yesterday and one this morning with some clots and darker blood but no new complaints or pain and she is in good spirits  Objective: Vital signs stable afebrile no acute distress patient looks good not examined today hemoglobin stable BUN and creatinine okay nuclear bleeding scan negative  Assessment: Probable diverticular bleeding  Plan: The risks benefits methods of colonoscopy was rediscussed with the patient and my partner will proceed tomorrow morning with further workup and plans pending those findings and continue clear liquids for now  Jeryl [place]

## 2013-05-17 NOTE — Progress Notes (Signed)
Charted that patient received a new IV started by S. Danise Edge, D. Astrid Divine, RN charted this information on the wrong patient. Patient [name] received the new IV and D. Astrid Divine, RN corrected the charting error immediately.

## 2013-05-18 ENCOUNTER — Encounter (HOSPITAL_COMMUNITY)
Admission: EM | Disposition: A | Discharge status: Home / Self Care | Payer: Self-pay | Source: Home / Self Care | Attending: Internal Medicine

## 2013-05-18 ENCOUNTER — Encounter (HOSPITAL_COMMUNITY): Payer: Self-pay | Admitting: *Deleted

## 2013-05-18 HISTORY — PX: COLONOSCOPY: SHX5424

## 2013-05-18 LAB — CBC
HCT: 23.7 % — ABNORMAL LOW (ref 36.0–46.0)
HEMATOCRIT: 27.9 % — AB (ref 36.0–46.0)
HEMOGLOBIN: 8.2 g/dL — AB (ref 12.0–15.0)
Hemoglobin: 9.9 g/dL — ABNORMAL LOW (ref 12.0–15.0)
MCH: 30.5 pg (ref 26.0–34.0)
MCH: 31.4 pg (ref 26.0–34.0)
MCHC: 34.6 g/dL (ref 30.0–36.0)
MCHC: 35.5 g/dL (ref 30.0–36.0)
MCV: 88.1 fL (ref 78.0–100.0)
MCV: 88.6 fL (ref 78.0–100.0)
PLATELETS: 306 10*3/uL (ref 150–400)
Platelets: 214 10*3/uL (ref 150–400)
RBC: 2.69 MIL/uL — ABNORMAL LOW (ref 3.87–5.11)
RBC: 3.15 MIL/uL — ABNORMAL LOW (ref 3.87–5.11)
RDW: 15.3 % (ref 11.5–15.5)
RDW: 15.4 % (ref 11.5–15.5)
WBC: 6.9 10*3/uL (ref 4.0–10.5)
WBC: 10.4 10*3/uL (ref 4.0–10.5)

## 2013-05-18 LAB — BASIC METABOLIC PANEL
BUN: 7 mg/dL (ref 6–23)
CO2: 25 meq/L (ref 19–32)
Calcium: 7.6 mg/dL — ABNORMAL LOW (ref 8.4–10.5)
Chloride: 110 mEq/L (ref 96–112)
Creatinine, Ser: 0.95 mg/dL (ref 0.50–1.10)
GFR calc Af Amer: 68 mL/min — ABNORMAL LOW (ref >90)
GFR calc non Af Amer: 59 mL/min — ABNORMAL LOW (ref >90)
GLUCOSE: 115 mg/dL — AB (ref 70–99)
POTASSIUM: 4.1 meq/L (ref 3.7–5.3)
SODIUM: 144 meq/L (ref 137–147)

## 2013-05-18 LAB — GLUCOSE, CAPILLARY
GLUCOSE-CAPILLARY: 108 mg/dL — AB (ref 70–99)
Glucose-Capillary: 110 mg/dL — ABNORMAL HIGH (ref 70–99)
Glucose-Capillary: 128 mg/dL — ABNORMAL HIGH (ref 70–99)
Glucose-Capillary: 141 mg/dL — ABNORMAL HIGH (ref 70–99)
Glucose-Capillary: 138 mg/dL — ABNORMAL HIGH (ref 70–99)

## 2013-05-18 SURGERY — COLONOSCOPY
Anesthesia: Moderate Sedation

## 2013-05-18 MED ORDER — MIDAZOLAM HCL 10 MG/2ML IJ SOLN
INTRAMUSCULAR | Status: DC | PRN
  Administered 2013-05-18: 1 mg via INTRAVENOUS
  Administered 2013-05-18: 2 mg via INTRAVENOUS
  Administered 2013-05-18 (×2): 1 mg via INTRAVENOUS
  Administered 2013-05-18: 2 mg via INTRAVENOUS

## 2013-05-18 MED ORDER — LACTATED RINGERS IV SOLN
INTRAVENOUS | Status: DC
  Administered 2013-05-18: 1000 mL via INTRAVENOUS

## 2013-05-18 MED ORDER — VITAMINS A & D EX OINT
TOPICAL_OINTMENT | CUTANEOUS | Status: AC
  Administered 2013-05-18: 17:00:00
  Filled 2013-05-18: qty 5

## 2013-05-18 MED ORDER — FENTANYL CITRATE 0.05 MG/ML IJ SOLN
INTRAMUSCULAR | Status: AC
  Filled 2013-05-18: qty 2

## 2013-05-18 MED ORDER — MIDAZOLAM HCL 10 MG/2ML IJ SOLN
INTRAMUSCULAR | Status: AC
  Filled 2013-05-18: qty 2

## 2013-05-18 MED ORDER — FENTANYL CITRATE 0.05 MG/ML IJ SOLN
INTRAMUSCULAR | Status: DC | PRN
  Administered 2013-05-18 (×3): 25 ug via INTRAVENOUS

## 2013-05-18 MED ORDER — DIPHENHYDRAMINE HCL 50 MG/ML IJ SOLN
INTRAMUSCULAR | Status: AC
  Filled 2013-05-18: qty 1

## 2013-05-18 MED ORDER — SODIUM CHLORIDE 0.9 % IV SOLN: INTRAVENOUS | Status: DC

## 2013-05-18 NOTE — Op Note (Signed)
[facility] [place], [address]   COLONOSCOPY PROCEDURE REPORT  PATIENT: [name], [name]  MR#: [id number] BIRTHDATE: 15-Nov-1940 , 72  yrs. old GENDER: Female ENDOSCOPIST: Acquanetta Sit, MD REFERRED BY: PROCEDURE DATE:  05/18/2013 PROCEDURE:   colonoscopy ASA CLASS:   2 INDICATIONS:lower gi bleed MEDICATIONS: Fentanyl 6mcg iv, Versed 7mg  iv  DESCRIPTION OF PROCEDURE:   After the risks benefits and alternatives of the procedure were thoroughly explained, informed consent was obtained.  Rectal exam normal.        The Pentax Ped Colon [id number]  endoscope was introduced through the anus and advanced to the cecum     . No adverse events experienced.   The quality of the prep was good.       The instrument was then slowly withdrawn as the colon was fully examined.  The cecum and ascending colon were normal. There were a few diverticula in the transvers colon. The were numerous diverticuli ain the sigmoid and decending colon. No active bleeding.    The time to cecum=  .  Withdrawal time=  .  The scope was withdrawn and the procedure completed. COMPLICATIONS: There were no complications.  ENDOSCOPIC IMPRESSION: Diverticulosis  RECOMMENDATIONS: Advance diet. Home soon. We will sign off.   eSigned:  Acquanetta Sit, MD 05/18/2013 1:14 PM   cc:

## 2013-05-18 NOTE — Progress Notes (Signed)
Agree with the previous RN's assessment. Will continue to monitor patient. Krista Godsil M Setzer  

## 2013-05-18 NOTE — Progress Notes (Signed)
PROGRESS NOTE   [name] IEP:[id number] DOB: Aug 29, 1940 DOA: 05/13/2013 PCP: Odette Fraction, MD    HPI:  [name] is a 73 y.o. female with history of diabetes mellitus, hyperlipidemia, hypertension presents to the ER because of multiple episodes of rectal bleeding.   Assessment/Plan:  Painless hematochezia  - Hb continues to drop,  NM GI Bleeding scan 4/25 negative.   - clinically patient is experiencing ongoing bleeding, I appreciate GI input  - plan for colonoscopy today   ABLA - due to #1 - required total of 5U pRBC, no requirement this morning  Acute encephalopathy - on and off, patient reports some confusion overnight, patient clear this morning but continue to deny bloody BMs.   DM - SSI  HTN - hold home meds in the setting of #1 - controlled.   Diet: clears Fluids: NS DVT Prophylaxis: none  Code Status: Full Family Communication: husband bedside  Disposition Plan: inpatient  Consultants:  GI  Procedures:  none   Antibiotics - none  HPI/Subjective: - feels like the bleeding has stopped   Objective: Filed Vitals:   05/18/13 0000 05/18/13 0200 05/18/13 0400 05/18/13 0600  BP: 122/54 129/56 116/60 112/50  Pulse: 74 74 74 81  Temp: 97.8 F (36.6 C)  98.1 F (36.7 C)   TempSrc: Oral  Oral   Resp: 14 13 18 13   Height:      Weight:   89.4 kg (197 lb 1.5 oz)   SpO2: 95% 98% 97% 97%    Intake/Output Summary (Last 24 hours) at 05/18/13 0709 Last data filed at 05/18/13 0000  Gross per 24 hour  Intake   1000 ml  Output    300 ml  Net    700 ml   Filed Weights   05/14/13 0400 05/17/13 0354 05/18/13 0400  Weight: 89.7 kg (197 lb 12 oz) 91.4 kg (201 lb 8 oz) 89.4 kg (197 lb 1.5 oz)    Exam:  General:  NAD  Cardiovascular: regular rate and rhythm, without MRG  Respiratory: good air movement, clear to auscultation throughout, no wheezing, ronchi or rales  Abdomen: soft, not tender to palpation, positive bowel  sounds  MSK: no peripheral edema  Neuro: non focal  Data Reviewed: Basic Metabolic Panel:  Recent Labs Lab 05/14/13 0002 05/14/13 0425 05/17/13 0346 05/18/13 0614  NA 135* 137 142 144  K 4.2 4.3 3.5* 4.1  CL 99 104 112 110  CO2 17* 21 22 25   GLUCOSE 217* 121* 106* 115*  BUN 28* 28* 11 7  CREATININE 1.12* 1.11* 0.91 0.95  CALCIUM 9.2 7.9* 7.1* 7.6*   Liver Function Tests:  Recent Labs Lab 05/14/13 0002 05/14/13 0425 05/17/13 0346  AST 23 16 16   ALT 15 12 11   ALKPHOS 69 61 35*  BILITOT 0.3 0.2* 0.7  PROT 7.0 5.3* 4.0*  ALBUMIN 3.7 3.0* 2.2*   CBC:  Recent Labs Lab 05/16/13 0633 05/16/13 1405 05/17/13 0346 05/17/13 1753 05/18/13 0614  WBC 8.5 8.0 6.6 8.4 6.9  HGB 8.2* 7.6* 8.9* 9.5* 8.2*  HCT 23.9* 22.0* 25.3* 27.1* 23.7*  MCV 87.2 87.3 87.5 86.6 88.1  PLT 181 204 166 236 214   CBG:  Recent Labs Lab 05/17/13 1220 05/17/13 1625 05/17/13 2108 05/18/13 0014 05/18/13 0302  GLUCAP 118* 116* 153* 108* 110*    Recent Results (from the past 240 hour(s))  MRSA PCR SCREENING     Status: None   Collection Time    05/14/13  3:53 AM      Result Value Ref Range Status   MRSA by PCR NEGATIVE  NEGATIVE Final   Comment:            The GeneXpert MRSA Assay (FDA     approved for NASAL specimens     only), is one component of a     comprehensive MRSA colonization     surveillance program. It is not     intended to diagnose MRSA     infection nor to guide or     monitor treatment for     MRSA infections.     Studies: Nm Gi Blood Loss  05/16/2013   CLINICAL DATA:  Three day history of bleeding. Most recent bloody bowel movement 3 hr ago.  EXAM: NUCLEAR MEDICINE GASTROINTESTINAL BLEEDING SCAN  TECHNIQUE: Sequential abdominal images were obtained following intravenous administration of Tc-56m labeled red blood cells.  RADIOPHARMACEUTICALS:  24MILLI CURIE ULTRATAG TECHNETIUM TC 62M-LABELED RED BLOOD CELLS IV KITmCi Tc-33m in-vitro labeled red cells.   COMPARISON:  CT abdomen/ pelvis 05/14/2013  FINDINGS: Static imaging for 120 min demonstrates no focal accumulation of radiotracer within the small or large bowel to suggest active gastrointestinal bleeding. Normal accumulation of radiotracer in the urinary bladder. Normal blood pool uptake and hepatic uptake.  IMPRESSION: Negative bleeding study.   Electronically Signed   By: Jacqulynn Cadet M.D.   On: 05/16/2013 13:59    Scheduled Meds: . insulin aspart  0-9 Units Subcutaneous TID WC  . pantoprazole  40 mg Oral Daily   Continuous Infusions:  Principal Problem:   Lower GI bleed Active Problems:   Diabetes mellitus without complication   Hypertension   Hyperlipidemia   GI bleed   Time spent: 25   This note has been created with Surveyor, quantity. Any transcriptional errors are unintentional.   Marzetta Board, MD [facility] Pager [phone]. If 7 PM - 7 AM, please contact night-coverage at [email or web address], password [facility] 05/18/2013, 7:09 AM  LOS: 5 days

## 2013-05-19 ENCOUNTER — Encounter (HOSPITAL_COMMUNITY): Payer: Self-pay | Admitting: Gastroenterology

## 2013-05-19 LAB — CBC
HCT: 28.1 % — ABNORMAL LOW (ref 36.0–46.0)
HEMOGLOBIN: 9.7 g/dL — AB (ref 12.0–15.0)
MCH: 30.7 pg (ref 26.0–34.0)
MCHC: 34.5 g/dL (ref 30.0–36.0)
MCV: 88.9 fL (ref 78.0–100.0)
Platelets: 314 10*3/uL (ref 150–400)
RBC: 3.16 MIL/uL — ABNORMAL LOW (ref 3.87–5.11)
RDW: 15.4 % (ref 11.5–15.5)
WBC: 10.3 10*3/uL (ref 4.0–10.5)

## 2013-05-19 LAB — GLUCOSE, CAPILLARY
GLUCOSE-CAPILLARY: 113 mg/dL — AB (ref 70–99)
GLUCOSE-CAPILLARY: 124 mg/dL — AB (ref 70–99)
Glucose-Capillary: 127 mg/dL — ABNORMAL HIGH (ref 70–99)
Glucose-Capillary: 107 mg/dL — ABNORMAL HIGH (ref 70–99)

## 2013-05-19 NOTE — Care Management Note (Signed)
    Page 1 of 2   05/19/2013     2:35:11 PM CARE MANAGEMENT NOTE 05/19/2013  Patient:  [name], [name]   Account Number:  [id number]  Date Initiated:  05/14/2013  Documentation initiated by:  DAVIS,RHONDA  Subjective/Objective Assessment:   73 y.o. female who does use nsaids for minor pain control, 2 day hx of rectal bleeding,     Action/Plan:   is indep is adls, lives at home, hx of rectal bleeding, hypertension,metabolic acidosis, hx of dm   Anticipated DC Date:  05/19/2013   Anticipated DC Plan:  HOME/SELF CARE  In-house referral  NA      DC Planning Services  CM consult      PAC Choice  NA   Choice offered to / List presented to:  NA   DME arranged  NA      DME agency  NA     [place] arranged  NA      [place] agency  NA   Status of service:  Completed, signed off Medicare Important Message given?  NA - LOS <3 / Initial given by admissions (If response is "NO", the following Medicare IM given date fields will be blank) Date Medicare IM given:   Date Additional Medicare IM given:    Discharge Disposition:  HOME/SELF CARE  Per UR Regulation:  Reviewed for med. necessity/level of care/duration of stay  If discussed at [place] of Stay Meetings, dates discussed:   05/19/2013    Comments:  05/19/13 Adeleigh Barletta RN,BSN NCM [place].D/C HOME NO NEEDS.  [place], RN, BSN, [place] [phone] Chart Reviewed for discharge and hospital needs. Discharge needs at time of review: None present will follow for needs. Review of patient progress due on [id number].

## 2013-05-19 NOTE — Discharge Instructions (Signed)
Please hold your Aspirin and avoid all NSAIDs for 1 week. Follow up with your PCP to monitor your blood counts and if stable you may resume your Aspirin. Also discuss with your doctor about this.   You were cared for by a hospitalist during your hospital stay. If you have any questions about your discharge medications or the care you received while you were in the hospital after you are discharged, you can call the unit and asked to speak with the hospitalist on call if the hospitalist that took care of you is not available. Once you are discharged, your primary care physician will handle any further medical issues. Please note that NO REFILLS for any discharge medications will be authorized once you are discharged, as it is imperative that you return to your primary care physician (or establish a relationship with a primary care physician if you do not have one) for your aftercare needs so that they can reassess your need for medications and monitor your lab values.     If you do not have a primary care physician, you can call [phone] for a physician referral.  Follow with Primary MD [facility] TOM, MD in 5-7 days   Get CBC, CMP checked by your doctor and again as further instructed.  Get a 2 view Chest X ray done next visit if you had Pneumonia of Lung problems at the [place] reviewed and adjusted.  Please request your Prim.MD to go over all Hospital Tests and Procedure/Radiological results at the follow up, please get all Hospital records sent to your Prim MD by signing hospital release before you go home.  Activity: As tolerated with Full fall precautions use walker/cane & assistance as needed  Diet: regular  For Heart failure patients - Check your Weight same time everyday, if you gain over 2 pounds, or you develop in leg swelling, experience more shortness of breath or chest pain, call your Primary MD immediately. Follow Cardiac Low Salt Diet and 1.8 lit/day fluid  restriction.  Disposition Home   If you experience worsening of your admission symptoms, develop shortness of breath, life threatening emergency, suicidal or homicidal thoughts you must seek medical attention immediately by calling 911 or calling your MD immediately  if symptoms less severe.  You Must read complete instructions/literature along with all the possible adverse reactions/side effects for all the Medicines you take and that have been prescribed to you. Take any new Medicines after you have completely understood and accpet all the possible adverse reactions/side effects.   Do not drive and provide baby sitting services if your were admitted for syncope or siezures until you have seen by Primary MD or a Neurologist and advised to do so again.  Do not drive when taking Pain medications.   Do not take more than prescribed Pain, Sleep and Anxiety Medications  Special Instructions: If you have smoked or chewed Tobacco  in the last 2 yrs please stop smoking, stop any regular Alcohol  and or any Recreational drug use.  Wear Seat belts while driving.

## 2013-05-19 NOTE — Discharge Summary (Addendum)
Physician Discharge Summary  [name] IDP:[id number] DOB: 04/25/1940 DOA: 05/13/2013  PCP: Olivia Fraction, MD  Admit date: 05/13/2013 Discharge date: 05/19/2013  Time spent: 35 minutes  Recommendations for Outpatient Follow-up:  1. Follow up with PCP Dr. Dennard Schaumann in 1 week  Recommendations for primary care physician for things to follow:  Repeat CBC and consider restarting Aspirin  Discharge Diagnoses:  Principal Problem:   Lower GI bleed Active Problems:   Diabetes mellitus without complication   Hypertension   Hyperlipidemia   GI bleed  Discharge Condition: stable  Diet recommendation: heart healthy  Filed Weights   05/14/13 0400 05/17/13 0354 05/18/13 0400  Weight: 89.7 kg (197 lb 12 oz) 91.4 kg (201 lb 8 oz) 89.4 kg (197 lb 1.5 oz)    History of present illness:  [name] is a 73 y.o. female with history of diabetes mellitus, hyperlipidemia, hypertension presents to the ER because of multiple episodes of rectal bleeding. Patient states her symptoms started yesterday morning with blood tinged bowel movement and eventually patient started having frank bloody bowel movement. Denies any abdominal pain nausea vomiting. Has not had any previous similar episodes. In the ER CT abdomen and pelvis only shows fluid-filled colon. Patient was initially tachycardic improved with IV fluids. Patient denies having taken any NSAIDs. Patient takes aspirin prophylactically. Patient has denies any chest pain or shortness of breath or dizziness. Since patient is tachycardic will be admitted to step down.   Hospital Course:   Painless hematochezia - patient with slow bleed requiring a total of 5U pRBC over the course of 3 days, most likely due to a diverticular bleed. Gastroenterology has been consulted and patient underwent a NM GI Bleed scan which was negative, then a colonoscopy was pursued on 4/27 with findings of diverticulitis without evidence of active bleed. Clinically her  bleeding has stopped, her Hb has remained stable and patient's bleeding has resolved. She is to be discharged home with PCP follow up in a week. I advised her to hold her Aspirin until seen by her PCP and have her CBC rechecked.  ABLA - due to #1, required total of 5U pRBC during this hospitalization DM - SSI  HTN - to resume home medications.   Procedures:  Colonoscopy 4/27 The cecum and ascending colon were normal. There were a few diverticula in the transvers colon. The were numerous diverticuli  ain the sigmoid and decending colon. No active bleeding.   ENDOSCOPIC IMPRESSION: Diverticulosis  Consultations:  Gastroenterology   Discharge Exam: Filed Vitals:   05/18/13 1518 05/18/13 1959 05/18/13 2357 05/19/13 0436  BP: 146/64 135/63 126/64 124/61  Pulse:  78 84 75  Temp: 97.6 F (36.4 C) 97.9 F (36.6 C) 97.8 F (36.6 C) 97.7 F (36.5 C)  TempSrc: Oral Oral Oral Oral  Resp: 14 18 18 18   Height:      Weight:      SpO2: 92% 100% 100% 98%   General: NAD Cardiovascular: RRR Respiratory: CTA biL  Discharge Instructions    Medication List    STOP taking these medications       aspirin 81 MG tablet      TAKE these medications       beta carotene w/minerals tablet  Take 1 tablet by mouth daily.     ESTRADIOL VA  Place 0.02 % vaginally every 7 (seven) days. 1 ml once a week (pt has this compounded at [facility]) [phone])     fish oil-omega-3  fatty acids 1000 MG capsule  Take 1,200 mg by mouth daily.     glucosamine-chondroitin 500-400 MG tablet  Take 1 tablet by mouth daily.     hydrochlorothiazide 12.5 MG capsule  Commonly known as:  MICROZIDE  TAKE 1 CAPSULE EVERY DAY     hydrOXYzine 10 MG tablet  Commonly known as:  ATARAX/VISTARIL  Take 10 mg by mouth daily.     losartan 100 MG tablet  Commonly known as:  COZAAR  Take 100 mg by mouth daily.     metFORMIN 1000 MG tablet  Commonly known as:  GLUCOPHAGE  Take 1,000 mg by mouth 2 (two)  times daily with a meal.     metFORMIN 1000 MG tablet  Commonly known as:  GLUCOPHAGE  TAKE 1 TABLET BY MOUTH TWICE A DAY WITH A MEAL     multivitamin tablet  Take 1 tablet by mouth daily.     NIACIN PO  Take 400 mg by mouth daily.     omeprazole 40 MG capsule  Commonly known as:  PRILOSEC  Take 40 mg by mouth daily.     pravastatin 40 MG tablet  Commonly known as:  PRAVACHOL  TAKE 1 TABLET BY MOUTH AT BEDTIME     Vitamin D 2000 UNITS Caps  Take 2,000 Units by mouth daily.       Follow-up Information   Follow up with [facility] TOM, MD. Schedule an appointment as soon as possible for a visit in 1 week.   Specialty:  Family Medicine   Contact information:   [address] [place] [address] [phone]      The results of significant diagnostics from this hospitalization (including imaging, microbiology, ancillary and laboratory) are listed below for reference.    Significant Diagnostic Studies: Nm Gi Blood Loss  05/16/2013   CLINICAL DATA:  Three day history of bleeding. Most recent bloody bowel movement 3 hr ago.  EXAM: NUCLEAR MEDICINE GASTROINTESTINAL BLEEDING SCAN  TECHNIQUE: Sequential abdominal images were obtained following intravenous administration of Tc-104m labeled red blood cells.  RADIOPHARMACEUTICALS:  24MILLI CURIE ULTRATAG TECHNETIUM TC 72M-LABELED RED BLOOD CELLS IV KITmCi Tc-12m in-vitro labeled red cells.  COMPARISON:  CT abdomen/ pelvis 05/14/2013  FINDINGS: Static imaging for 120 min demonstrates no focal accumulation of radiotracer within the small or large bowel to suggest active gastrointestinal bleeding. Normal accumulation of radiotracer in the urinary bladder. Normal blood pool uptake and hepatic uptake.  IMPRESSION: Negative bleeding study.   Electronically Signed   By: Jacqulynn Cadet M.D.   On: 05/16/2013 13:59   Ct Abdomen Pelvis W Contrast  05/14/2013   CLINICAL DATA:  Lower abdominal pain and rectal bleeding for 1 day. White  cell count 10.1. Hemoccult positive stools.  EXAM: CT ABDOMEN AND PELVIS WITH CONTRAST  TECHNIQUE: Multidetector CT imaging of the abdomen and pelvis was performed using the standard protocol following bolus administration of intravenous contrast.  CONTRAST:  112mL OMNIPAQUE IOHEXOL 300 MG/ML  SOLN  COMPARISON:  None.  FINDINGS: Infiltration versus atelectasis focally in the lung bases.  Surgical absence of the gallbladder. The liver, spleen, pancreas, adrenal glands, abdominal aorta, and inferior vena cava are unremarkable. Subcentimeter lesions in both kidneys likely representing cysts. No hydronephrosis in either kidney. The stomach, small bowel, and colon are not abnormally distended. Fluid with small air-fluid levels throughout the colon consistent with liquid stool. Diffuse colonic diverticulosis. No wall thickening. No free air or free fluid in the abdomen. Abdominal wall musculature  appears intact.  Pelvis: The bladder wall is not abnormally distended. Uterus appears to be surgically absent. No abnormal pelvic mass or lymphadenopathy. No inflammatory changes in the colon. Appendix is not identified. Degenerative changes in the lumbar spine. Spondylolysis with mild spondylolisthesis at L4-5. No destructive bone lesions.  IMPRESSION: No acute process demonstrated in the abdomen or pelvis. Diffusely fluid-filled colon consistent with liquid stool. No colonic wall thickening or inflammatory changes.   Electronically Signed   By: Lucienne Capers M.D.   On: 05/14/2013 02:14   Microbiology: Recent Results (from the past 240 hour(s))  MRSA PCR SCREENING     Status: None   Collection Time    05/14/13  3:53 AM      Result Value Ref Range Status   MRSA by PCR NEGATIVE  NEGATIVE Final   Comment:            The GeneXpert MRSA Assay (FDA     approved for NASAL specimens     only), is one component of a     comprehensive MRSA colonization     surveillance program. It is not     intended to diagnose MRSA      infection nor to guide or     monitor treatment for     MRSA infections.     Labs: Basic Metabolic Panel:  Recent Labs Lab 05/14/13 0002 05/14/13 0425 05/17/13 0346 05/18/13 0614  NA 135* 137 142 144  K 4.2 4.3 3.5* 4.1  CL 99 104 112 110  CO2 17* 21 22 25   GLUCOSE 217* 121* 106* 115*  BUN 28* 28* 11 7  CREATININE 1.12* 1.11* 0.91 0.95  CALCIUM 9.2 7.9* 7.1* 7.6*   Liver Function Tests:  Recent Labs Lab 05/14/13 0002 05/14/13 0425 05/17/13 0346  AST 23 16 16   ALT 15 12 11   ALKPHOS 69 61 35*  BILITOT 0.3 0.2* 0.7  PROT 7.0 5.3* 4.0*  ALBUMIN 3.7 3.0* 2.2*   CBC:  Recent Labs Lab 05/17/13 0346 05/17/13 1753 05/18/13 0614 05/18/13 1757 05/19/13 0810  WBC 6.6 8.4 6.9 10.4 10.3  HGB 8.9* 9.5* 8.2* 9.9* 9.7*  HCT 25.3* 27.1* 23.7* 27.9* 28.1*  MCV 87.5 86.6 88.1 88.6 88.9  PLT 166 236 214 306 314   CBG:  Recent Labs Lab 05/18/13 1957 05/18/13 2353 05/19/13 0432 05/19/13 0736 05/19/13 1154  GLUCAP 138* 113* 124* 127* 107*     Signed:  [facility] 05/19/2013, 1:20 PM

## 2013-05-25 ENCOUNTER — Encounter: Payer: Self-pay | Admitting: Family Medicine

## 2013-05-25 ENCOUNTER — Ambulatory Visit (INDEPENDENT_AMBULATORY_CARE_PROVIDER_SITE_OTHER): Payer: Medicare Other | Admitting: Family Medicine

## 2013-05-25 VITALS — BP 100/60 | HR 96 | Temp 97.5°F | Resp 20 | Ht 61.5 in | Wt 194.0 lb

## 2013-05-25 DIAGNOSIS — Z09 Encounter for follow-up examination after completed treatment for conditions other than malignant neoplasm: Secondary | ICD-10-CM

## 2013-05-25 NOTE — Progress Notes (Signed)
Subjective:    Patient ID: [name], female    DOB: [birth year], 73 y.o.   MRN: [id number]  HPI  Patient was recently admitted to hospital with lower GI bleed.  I have copied relevant portions of the DC summary below for my reference: Admit date: 05/13/2013  Discharge date: 05/19/2013  Time spent: 35 minutes  Recommendations for Outpatient Follow-up:  1. Follow up with PCP Dr. Dennard Schaumann in 1 week Recommendations for primary care physician for things to follow:  Repeat CBC and consider restarting Aspirin  Discharge Diagnoses:  Principal Problem:  Lower GI bleed  Active Problems:  Diabetes mellitus without complication  Hypertension  Hyperlipidemia  GI bleed  Discharge Condition: stable  Diet recommendation: heart healthy  Filed Weights    05/14/13 0400  05/17/13 0354  05/18/13 0400   Weight:  89.7 kg (197 lb 12 oz)  91.4 kg (201 lb 8 oz)  89.4 kg (197 lb 1.5 oz)   History of present illness:  [name] is a 73 y.o. female with history of diabetes mellitus, hyperlipidemia, hypertension presents to the ER because of multiple episodes of rectal bleeding. Patient states her symptoms started yesterday morning with blood tinged bowel movement and eventually patient started having frank bloody bowel movement. Denies any abdominal pain nausea vomiting. Has not had any previous similar episodes. In the ER CT abdomen and pelvis only shows fluid-filled colon. Patient was initially tachycardic improved with IV fluids. Patient denies having taken any NSAIDs. Patient takes aspirin prophylactically. Patient has denies any chest pain or shortness of breath or dizziness. Since patient is tachycardic will be admitted to step down.  Hospital Course:  Painless hematochezia - patient with slow bleed requiring a total of 5U pRBC over the course of 3 days, most likely due to a diverticular bleed. Gastroenterology has been consulted and patient underwent a NM GI Bleed scan which was negative, then a  colonoscopy was pursued on 4/27 with findings of diverticulitis without evidence of active bleed. Clinically her bleeding has stopped, her Hb has remained stable and patient's bleeding has resolved. She is to be discharged home with PCP follow up in a week. I advised her to hold her Aspirin until seen by her PCP and have her CBC rechecked.  ABLA - due to #1, required total of 5U pRBC during this hospitalization  DM - SSI  HTN - to resume home medications.  Procedures:  Colonoscopy 4/27 The cecum and ascending colon were normal. There were a few diverticula in the transvers colon. The were numerous diverticuli ain the sigmoid and decending colon. No active bleeding.  ENDOSCOPIC IMPRESSION: Diverticulosis  Consultations:  Gastroenterology  Discharge Exam:  Filed Vitals:    05/18/13 1518  05/18/13 1959  05/18/13 2357  05/19/13 0436   BP:  146/64  135/63  126/64  124/61   Pulse:   78  84  75   Temp:  97.6 F (36.4 C)  97.9 F (36.6 C)  97.8 F (36.6 C)  97.7 F (36.5 C)   TempSrc:  Oral  Oral  Oral  Oral   Resp:  14  18  18  18    Height:       Weight:       SpO2:  92%  100%  100%  98%   General: NAD  Cardiovascular: RRR  Respiratory: CTA biL  Discharge Instructions    Medication List     STOP taking these medications  aspirin 81 MG tablet     TAKE these medications       beta carotene w/minerals tablet    Take 1 tablet by mouth daily.    ESTRADIOL VA    Place 0.02 % vaginally every 7 (seven) days. 1 ml once a week (pt has this compounded at [facility]) [phone])    fish oil-omega-3 fatty acids 1000 MG capsule    Take 1,200 mg by mouth daily.    glucosamine-chondroitin 500-400 MG tablet    Take 1 tablet by mouth daily.    hydrochlorothiazide 12.5 MG capsule    Commonly known as: MICROZIDE    TAKE 1 CAPSULE EVERY DAY    hydrOXYzine 10 MG tablet    Commonly known as: ATARAX/VISTARIL    Take 10 mg by mouth daily.    losartan 100 MG tablet    Commonly  known as: COZAAR    Take 100 mg by mouth daily.    metFORMIN 1000 MG tablet    Commonly known as: GLUCOPHAGE    Take 1,000 mg by mouth 2 (two) times daily with a meal.    metFORMIN 1000 MG tablet    Commonly known as: GLUCOPHAGE    TAKE 1 TABLET BY MOUTH TWICE A DAY WITH A MEAL    multivitamin tablet    Take 1 tablet by mouth daily.    NIACIN PO    Take 400 mg by mouth daily.    omeprazole 40 MG capsule    Commonly known as: PRILOSEC    Take 40 mg by mouth daily.    pravastatin 40 MG tablet    Commonly known as: PRAVACHOL    TAKE 1 TABLET BY MOUTH AT BEDTIME    Vitamin D 2000 UNITS Caps    Take 2,000 Units by mouth daily.      Follow-up Information    Follow up with [facility] TOM, MD. Schedule an appointment as soon as possible for a visit in 1 week.    Specialty: Family Medicine    Contact information:    [address]  [place] [address]  [phone]      The results of significant diagnostics from this hospitalization (including imaging, microbiology, ancillary and laboratory) are listed below for reference.   Significant Diagnostic Studies:  Nm Gi Blood Loss  05/16/2013 CLINICAL DATA: Three day history of bleeding. Most recent bloody bowel movement 3 hr ago. EXAM: NUCLEAR MEDICINE GASTROINTESTINAL BLEEDING SCAN TECHNIQUE: Sequential abdominal images were obtained following intravenous administration of Tc-34m labeled red blood cells. RADIOPHARMACEUTICALS: 24MILLI CURIE ULTRATAG TECHNETIUM TC 51M-LABELED RED BLOOD CELLS IV KITmCi Tc-2m in-vitro labeled red cells. COMPARISON: CT abdomen/ pelvis 05/14/2013 FINDINGS: Static imaging for 120 min demonstrates no focal accumulation of radiotracer within the small or large bowel to suggest active gastrointestinal bleeding. Normal accumulation of radiotracer in the urinary bladder. Normal blood pool uptake and hepatic uptake. IMPRESSION: Negative bleeding study. Electronically Signed By: Jacqulynn Cadet M.D. On:  05/16/2013 13:59  Ct Abdomen Pelvis W Contrast  05/14/2013 CLINICAL DATA: Lower abdominal pain and rectal bleeding for 1 day. White cell count 10.1. Hemoccult positive stools. EXAM: CT ABDOMEN AND PELVIS WITH CONTRAST TECHNIQUE: Multidetector CT imaging of the abdomen and pelvis was performed using the standard protocol following bolus administration of intravenous contrast. CONTRAST: 19mL OMNIPAQUE IOHEXOL 300 MG/ML SOLN COMPARISON: None. FINDINGS: Infiltration versus atelectasis focally in the lung bases. Surgical absence of the gallbladder. The liver, spleen, pancreas, adrenal glands, abdominal aorta, and  inferior vena cava are unremarkable. Subcentimeter lesions in both kidneys likely representing cysts. No hydronephrosis in either kidney. The stomach, small bowel, and colon are not abnormally distended. Fluid with small air-fluid levels throughout the colon consistent with liquid stool. Diffuse colonic diverticulosis. No wall thickening. No free air or free fluid in the abdomen. Abdominal wall musculature appears intact. Pelvis: The bladder wall is not abnormally distended. Uterus appears to be surgically absent. No abnormal pelvic mass or lymphadenopathy. No inflammatory changes in the colon. Appendix is not identified. Degenerative changes in the lumbar spine. Spondylolysis with mild spondylolisthesis at L4-5. No destructive bone lesions. IMPRESSION: No acute process demonstrated in the abdomen or pelvis. Diffusely fluid-filled colon consistent with liquid stool. No colonic wall thickening or inflammatory changes. Electronically Signed By: Lucienne Capers M.D. On: 05/14/2013 02:14   She is here today for follow up.  Since discharge from the hospital she reports significant fatigue and malaise. Her blood pressures date 100/60. She's not currently taking an aspirin but she is still taking her fish. He denies any melena or hematochezia. She denies abdominal pain or fevers. He denies any chest pain shortness  breath or dyspnea on exertion. She does complain of diarrhea which she attributed to the metformin and she is interested in other options for her diabetes. Past Medical History  Diagnosis Date  . Diabetes mellitus without complication   . Hypertension   . Hyperlipidemia   . Diverticulosis    Current Outpatient Prescriptions on File Prior to Visit  Medication Sig Dispense Refill  . beta carotene w/minerals (OCUVITE) tablet Take 1 tablet by mouth daily.      . Cholecalciferol (VITAMIN D) 2000 UNITS CAPS Take 2,000 Units by mouth daily.      Marland Kitchen ESTRADIOL VA Place 0.02 % vaginally every 7 (seven) days. 1 ml once a week (pt has this compounded at [facility]) [phone])      . fish oil-omega-3 fatty acids 1000 MG capsule Take 1,200 mg by mouth daily.      Marland Kitchen glucosamine-chondroitin 500-400 MG tablet Take 1 tablet by mouth daily.       . hydrochlorothiazide (MICROZIDE) 12.5 MG capsule TAKE 1 CAPSULE EVERY DAY  90 capsule  1  . hydrOXYzine (ATARAX/VISTARIL) 10 MG tablet Take 10 mg by mouth daily.      Marland Kitchen losartan (COZAAR) 100 MG tablet Take 100 mg by mouth daily.      . metFORMIN (GLUCOPHAGE) 1000 MG tablet Take 1,000 mg by mouth 2 (two) times daily with a meal.      . metFORMIN (GLUCOPHAGE) 1000 MG tablet TAKE 1 TABLET BY MOUTH TWICE A DAY WITH A MEAL  60 tablet  5  . Multiple Vitamin (MULTIVITAMIN) tablet Take 1 tablet by mouth daily.      Marland Kitchen NIACIN PO Take 400 mg by mouth daily.      Marland Kitchen omeprazole (PRILOSEC) 40 MG capsule Take 40 mg by mouth daily.      . pravastatin (PRAVACHOL) 40 MG tablet TAKE 1 TABLET BY MOUTH AT BEDTIME  90 tablet  1   No current facility-administered medications on file prior to visit.   Allergies  Allergen Reactions  . Flagyl [Metronidazole] Hives  . Lisinopril     hyperkalemia   Past Surgical History  Procedure Laterality Date  . Abdominal hysterectomy    . Cholecystectomy    . Bladder tack    . Colonoscopy N/A 05/18/2013    Procedure: COLONOSCOPY;   Surgeon: Wonda Horner, MD;  Location: [facility] ENDOSCOPY;  Service: Endoscopy;  Laterality: N/A;   History   Social History  . Marital Status: Married    Spouse Name: N/A    Number of Children: N/A  . Years of Education: N/A   Occupational History  . Not on file.   Social History Main Topics  . Smoking status: Never Smoker   . Smokeless tobacco: Never Used  . Alcohol Use: No  . Drug Use: No  . Sexual Activity: Yes   Other Topics Concern  . Not on file   Social History Narrative  . No narrative on file      Review of Systems  All other systems reviewed and are negative.      Objective:   Physical Exam  Vitals reviewed. Constitutional: She appears well-developed and well-nourished.  Neck: Neck supple. No thyromegaly present.  Cardiovascular: Normal rate, regular rhythm and normal heart sounds.  Exam reveals no gallop and no friction rub.   No murmur heard. Pulmonary/Chest: Effort normal and breath sounds normal. No respiratory distress. She has no wheezes. She has no rales.  Abdominal: Soft. Bowel sounds are normal. She exhibits no distension. There is no tenderness. There is no rebound and no guarding.  Lymphadenopathy:    She has no cervical adenopathy.          Assessment & Plan:  1. Hospital discharge follow-up I recommended the patient stay off aspirin and fish oil for an additional month. I recommended that she temporarily discontinue hydrochlorothiazide due to her low blood pressure and relatively elevated heart rate. I anticipate gradual improvement in her fatigue as she recovers from her hospitalization. I will check a CBC and CMP. This nature there is no evidence of further blood loss.  I recommended switching the patient from metformin to [place] if affordable.  She will check. - CBC with Differential - COMPLETE METABOLIC PANEL WITH GFR

## 2013-05-26 LAB — COMPLETE METABOLIC PANEL WITH GFR
ALBUMIN: 3.9 g/dL (ref 3.5–5.2)
ALT: 20 U/L (ref 0–35)
AST: 24 U/L (ref 0–37)
Alkaline Phosphatase: 50 U/L (ref 39–117)
BUN: 12 mg/dL (ref 6–23)
CALCIUM: 9.3 mg/dL (ref 8.4–10.5)
CO2: 25 mEq/L (ref 19–32)
Chloride: 105 mEq/L (ref 96–112)
Creat: 1.12 mg/dL — ABNORMAL HIGH (ref 0.50–1.10)
GFR, EST AFRICAN AMERICAN: 57 mL/min — AB
GFR, Est Non African American: 50 mL/min — ABNORMAL LOW
GLUCOSE: 190 mg/dL — AB (ref 70–99)
Potassium: 4.5 mEq/L (ref 3.5–5.3)
SODIUM: 140 meq/L (ref 135–145)
Total Bilirubin: 0.8 mg/dL (ref 0.2–1.2)
Total Protein: 5.9 g/dL — ABNORMAL LOW (ref 6.0–8.3)

## 2013-05-26 LAB — CBC WITH DIFFERENTIAL/PLATELET
BASOS ABS: 0 10*3/uL (ref 0.0–0.1)
BASOS PCT: 0 % (ref 0–1)
EOS ABS: 0.2 10*3/uL (ref 0.0–0.7)
Eosinophils Relative: 3 % (ref 0–5)
HCT: 28.3 % — ABNORMAL LOW (ref 36.0–46.0)
HEMOGLOBIN: 9.3 g/dL — AB (ref 12.0–15.0)
Lymphocytes Relative: 31 % (ref 12–46)
Lymphs Abs: 2.2 10*3/uL (ref 0.7–4.0)
MCH: 30.1 pg (ref 26.0–34.0)
MCHC: 32.9 g/dL (ref 30.0–36.0)
MCV: 91.6 fL (ref 78.0–100.0)
Monocytes Absolute: 0.5 10*3/uL (ref 0.1–1.0)
Monocytes Relative: 7 % (ref 3–12)
NEUTROS PCT: 59 % (ref 43–77)
Neutro Abs: 4.2 10*3/uL (ref 1.7–7.7)
PLATELETS: 443 10*3/uL — AB (ref 150–400)
RBC: 3.09 MIL/uL — ABNORMAL LOW (ref 3.87–5.11)
RDW: 14.8 % (ref 11.5–15.5)
WBC: 7.2 10*3/uL (ref 4.0–10.5)

## 2013-05-28 ENCOUNTER — Other Ambulatory Visit: Payer: Self-pay | Admitting: *Deleted

## 2013-05-28 ENCOUNTER — Telehealth: Payer: Self-pay | Admitting: *Deleted

## 2013-05-28 DIAGNOSIS — D649 Anemia, unspecified: Secondary | ICD-10-CM

## 2013-05-28 MED ORDER — FERROUS SULFATE 325 (65 FE) MG PO TABS
325.0000 mg | ORAL_TABLET | Freq: Two times a day (BID) | ORAL | Status: DC
Start: 2013-05-28 — End: 2013-11-30

## 2013-05-28 NOTE — Telephone Encounter (Signed)
Pt called stating she wants to change to glipizide as provider has mentioned.In provider notes you had mentioned januvia or metformin, is glipizide what you wanted?

## 2013-05-29 MED ORDER — SITAGLIPTIN PHOSPHATE 100 MG PO TABS: 100.0000 mg | ORAL_TABLET | Freq: Every day | ORAL | Status: DC

## 2013-05-29 NOTE — Telephone Encounter (Signed)
Pt aware of medication change 

## 2013-05-29 NOTE — Telephone Encounter (Signed)
I wanted her to stop metformin and start januvia 100 mg poqday if it was affordable.

## 2013-06-11 ENCOUNTER — Other Ambulatory Visit: Payer: Medicare Other

## 2013-06-11 DIAGNOSIS — D649 Anemia, unspecified: Secondary | ICD-10-CM

## 2013-06-11 LAB — CBC WITH DIFFERENTIAL/PLATELET
BASOS ABS: 0.1 10*3/uL (ref 0.0–0.1)
Basophils Relative: 1 % (ref 0–1)
EOS ABS: 0.3 10*3/uL (ref 0.0–0.7)
Eosinophils Relative: 5 % (ref 0–5)
HCT: 32.1 % — ABNORMAL LOW (ref 36.0–46.0)
Hemoglobin: 10.8 g/dL — ABNORMAL LOW (ref 12.0–15.0)
LYMPHS ABS: 1.5 10*3/uL (ref 0.7–4.0)
Lymphocytes Relative: 26 % (ref 12–46)
MCH: 30.3 pg (ref 26.0–34.0)
MCHC: 33.6 g/dL (ref 30.0–36.0)
MCV: 90.2 fL (ref 78.0–100.0)
Monocytes Absolute: 0.5 10*3/uL (ref 0.1–1.0)
Monocytes Relative: 8 % (ref 3–12)
NEUTROS PCT: 60 % (ref 43–77)
Neutro Abs: 3.5 10*3/uL (ref 1.7–7.7)
PLATELETS: 284 10*3/uL (ref 150–400)
RBC: 3.56 MIL/uL — AB (ref 3.87–5.11)
RDW: 14.8 % (ref 11.5–15.5)
WBC: 5.9 10*3/uL (ref 4.0–10.5)

## 2013-07-14 ENCOUNTER — Other Ambulatory Visit: Payer: Self-pay | Admitting: Family Medicine

## 2013-07-14 NOTE — Telephone Encounter (Signed)
HCTZ denied.  Per last visit provider told her to hold this.  I do not see where it has been resumed.

## 2013-07-14 NOTE — Telephone Encounter (Signed)
Patient was supposed to hold hydrochlorothiazide temporarily. I would like to resume the hydrochlorothiazide only if her blood pressure is elevated. How is her blood pressure running.

## 2013-07-15 NOTE — Telephone Encounter (Signed)
Her BP has been doing ok and she was told to continue to hold the HCTZ unless BP starts to rise again. Also pt wanted to let you know that her BS has been 125-130 on the Januvia and without it her BS runs 108-105-99 and was wondering why??

## 2013-07-16 NOTE — Telephone Encounter (Signed)
Continue to hold hctz if bp is okay.  Sugars should not be worse on januvia.  I would assume they would be even higher if she quit the med.

## 2013-07-29 ENCOUNTER — Ambulatory Visit (INDEPENDENT_AMBULATORY_CARE_PROVIDER_SITE_OTHER): Payer: Medicare Other | Admitting: Family Medicine

## 2013-07-29 ENCOUNTER — Encounter: Payer: Self-pay | Admitting: Family Medicine

## 2013-07-29 VITALS — BP 134/86 | HR 76 | Temp 98.5°F | Resp 14 | Ht 61.5 in | Wt 187.0 lb

## 2013-07-29 DIAGNOSIS — R42 Dizziness and giddiness: Secondary | ICD-10-CM

## 2013-07-29 DIAGNOSIS — D62 Acute posthemorrhagic anemia: Secondary | ICD-10-CM

## 2013-07-29 DIAGNOSIS — E785 Hyperlipidemia, unspecified: Secondary | ICD-10-CM

## 2013-07-29 DIAGNOSIS — I10 Essential (primary) hypertension: Secondary | ICD-10-CM

## 2013-07-29 DIAGNOSIS — E119 Type 2 diabetes mellitus without complications: Secondary | ICD-10-CM

## 2013-07-29 LAB — COMPREHENSIVE METABOLIC PANEL
ALT: 16 U/L (ref 0–35)
AST: 26 U/L (ref 0–37)
Albumin: 4.5 g/dL (ref 3.5–5.2)
Alkaline Phosphatase: 47 U/L (ref 39–117)
BILIRUBIN TOTAL: 0.5 mg/dL (ref 0.2–1.2)
BUN: 24 mg/dL — ABNORMAL HIGH (ref 6–23)
CO2: 20 mEq/L (ref 19–32)
Calcium: 10.6 mg/dL — ABNORMAL HIGH (ref 8.4–10.5)
Chloride: 99 mEq/L (ref 96–112)
Creat: 1.3 mg/dL — ABNORMAL HIGH (ref 0.50–1.10)
Glucose, Bld: 100 mg/dL — ABNORMAL HIGH (ref 70–99)
Potassium: 5.2 mEq/L (ref 3.5–5.3)
Sodium: 135 mEq/L (ref 135–145)
Total Protein: 7 g/dL (ref 6.0–8.3)

## 2013-07-29 LAB — LIPID PANEL
CHOL/HDL RATIO: 3.4 ratio
CHOLESTEROL: 138 mg/dL (ref 0–200)
HDL: 41 mg/dL (ref >39)
LDL CALC: 67 mg/dL (ref 0–99)
Triglycerides: 151 mg/dL — ABNORMAL HIGH (ref <150)
VLDL: 30 mg/dL (ref 0–40)

## 2013-07-29 LAB — IRON: Iron: 53 ug/dL (ref 42–145)

## 2013-07-29 NOTE — Patient Instructions (Signed)
Continue current medications We will call with lab results  F/U 4 months  

## 2013-07-29 NOTE — Progress Notes (Signed)
Patient ID: [name], female   DOB: 04/15/1940, 73 y.o.   MRN: [id number]   Subjective:    Patient ID: [name], female    DOB: [birth year], 73 y.o.   MRN: [id number]  Patient presents for Hospital F/U and Weak spell  patient here for hospital followup however she's not been admitted recently think this was an error. She was seen back in May after acute blood loss secondary to GI bleed. That time she was started on iron tablets after she had some transfusions in the hospital. She's due for repeat CBC. She did have a weak spell yesterday after taking hydrochlorothiazide I noted at her last visit decision was made to hold off on this because her blood pressure was running lower and she was on the losartan. She otherwise has no concerns. Diabetes mellitus she's due for A1 C. and lipid panel. She's tolerating Januvia without difficulties her fastings are between 120 and 130 no hypoglycemia. She does monitor her blood pressure at home after her spell yesterday her blood pressure was 123/83 but states it is typically in the 4:91 systolic and she feels well    Review Of Systems:  GEN- denies fatigue, fever, weight loss,weakness, recent illness HEENT- denies eye drainage, change in vision, nasal discharge, CVS- denies chest pain, palpitations RESP- denies SOB, cough, wheeze ABD- denies N/V, change in stools, abd pain GU- denies dysuria, hematuria, dribbling, incontinence MSK- denies joint pain, muscle aches, injury Neuro- denies headache, +dizziness, syncope, seizure activity       Objective:    BP 134/86  Pulse 76  Temp(Src) 98.5 F (36.9 C) (Oral)  Resp 14  Ht 5' 1.5" (1.562 m)  Wt 187 lb (84.823 kg)  BMI 34.77 kg/m2 GEN- NAD, alert and oriented x3 HEENT- PERRL, EOMI, non injected sclera, pink conjunctiva, MMM, oropharynx clear CVS- RRR, no murmur RESP-CTAB EXT- No edema Pulses- Radial, DP- 2+        Assessment & Plan:      Problem List Items Addressed This Visit   Hyperlipidemia   Relevant Orders      Lipid panel   Diabetes mellitus without complication - Primary   Relevant Orders      CBC with Differential      Comprehensive metabolic panel      Hemoglobin A1c   Acute blood loss anemia   Relevant Orders      Iron      Note: This dictation was prepared with Dragon dictation along with smaller phrase technology. Any transcriptional errors that result from this process are unintentional.

## 2013-07-29 NOTE — Assessment & Plan Note (Signed)
Her last A1c was 6%. She's currently on Januvia and tolerating this well. We'll repeat her A1c as well as her renal function

## 2013-07-29 NOTE — Assessment & Plan Note (Signed)
She's currently on iron sulfate her hemoglobin have been coming up since the acute blood loss with a GI bleed will recheck her CBC today

## 2013-07-29 NOTE — Assessment & Plan Note (Addendum)
Continue losartan, I think she can safely be less than 140/90 base on her age and her symptoms when her blood pressures lower

## 2013-07-29 NOTE — Assessment & Plan Note (Signed)
I think she is getting some transient hypotension which does take the hydrochlorothiazide we will discontinue this altogether. She will continue on her losartan

## 2013-07-30 ENCOUNTER — Other Ambulatory Visit: Payer: Medicare Other

## 2013-07-30 DIAGNOSIS — I10 Essential (primary) hypertension: Secondary | ICD-10-CM

## 2013-07-30 DIAGNOSIS — E1059 Type 1 diabetes mellitus with other circulatory complications: Secondary | ICD-10-CM

## 2013-07-30 LAB — CBC WITH DIFFERENTIAL/PLATELET
BASOS ABS: 0.1 10*3/uL (ref 0.0–0.1)
Basophils Relative: 1 % (ref 0–1)
EOS PCT: 3 % (ref 0–5)
Eosinophils Absolute: 0.2 10*3/uL (ref 0.0–0.7)
HCT: 38.6 % (ref 36.0–46.0)
Hemoglobin: 13.2 g/dL (ref 12.0–15.0)
LYMPHS PCT: 26 % (ref 12–46)
Lymphs Abs: 1.7 10*3/uL (ref 0.7–4.0)
MCH: 29.6 pg (ref 26.0–34.0)
MCHC: 34.2 g/dL (ref 30.0–36.0)
MCV: 86.5 fL (ref 78.0–100.0)
Monocytes Absolute: 0.4 10*3/uL (ref 0.1–1.0)
Monocytes Relative: 7 % (ref 3–12)
Neutro Abs: 4 10*3/uL (ref 1.7–7.7)
Neutrophils Relative %: 63 % (ref 43–77)
PLATELETS: 323 10*3/uL (ref 150–400)
RBC: 4.46 MIL/uL (ref 3.87–5.11)
RDW: 13.6 % (ref 11.5–15.5)
WBC: 6.4 10*3/uL (ref 4.0–10.5)

## 2013-07-30 LAB — HEMOGLOBIN A1C
HEMOGLOBIN A1C: 5.6 % (ref <5.7)
Mean Plasma Glucose: 114 mg/dL (ref <117)

## 2013-07-31 ENCOUNTER — Encounter: Payer: Self-pay | Admitting: *Deleted

## 2013-08-19 ENCOUNTER — Other Ambulatory Visit: Payer: Self-pay | Admitting: Family Medicine

## 2013-09-06 ENCOUNTER — Other Ambulatory Visit: Payer: Self-pay | Admitting: Family Medicine

## 2013-09-07 NOTE — Telephone Encounter (Signed)
Refill appropriate and filled per protocol. 

## 2013-10-02 ENCOUNTER — Encounter: Payer: Self-pay | Admitting: Family Medicine

## 2013-10-02 ENCOUNTER — Telehealth: Payer: Self-pay | Admitting: *Deleted

## 2013-10-02 ENCOUNTER — Ambulatory Visit (INDEPENDENT_AMBULATORY_CARE_PROVIDER_SITE_OTHER): Payer: Medicare Other | Admitting: Family Medicine

## 2013-10-02 VITALS — BP 122/68 | HR 100 | Temp 98.3°F | Resp 16 | Ht 61.5 in | Wt 186.0 lb

## 2013-10-02 DIAGNOSIS — K142 Median rhomboid glossitis: Secondary | ICD-10-CM

## 2013-10-02 DIAGNOSIS — L259 Unspecified contact dermatitis, unspecified cause: Secondary | ICD-10-CM

## 2013-10-02 MED ORDER — MOMETASONE FUROATE 0.1 % EX OINT: TOPICAL_OINTMENT | Freq: Every day | CUTANEOUS | Status: DC

## 2013-10-02 MED ORDER — FLUCONAZOLE 150 MG PO TABS: ORAL_TABLET | ORAL | Status: DC

## 2013-10-02 NOTE — Progress Notes (Signed)
Subjective:    Patient ID: CERISE LIEBER, female    DOB: [birth year], 73 y.o.   MRN: [id number]  HPI  Patient reports an itchy, burning patch on her upper left chest for 2 weeks. There is no visible rash. There is no apparent shingles. There is no rash on her back or in her armpit. There is no visible rash at all. The itching and tingling is mild. She also has a red patch on the tip of her tongue which is burning tender and sore and has been there for several months. She tried nystatin without relief. Past Medical History  Diagnosis Date  . Diabetes mellitus without complication   . Hypertension   . Hyperlipidemia   . Diverticulosis    Past Surgical History  Procedure Laterality Date  . Abdominal hysterectomy    . Cholecystectomy    . Bladder tack    . Colonoscopy N/A 05/18/2013    Procedure: COLONOSCOPY;  Surgeon: Wonda Horner, MD;  Location: [facility];  Service: Endoscopy;  Laterality: N/A;   Current Outpatient Prescriptions on File Prior to Visit  Medication Sig Dispense Refill  . beta carotene w/minerals (OCUVITE) tablet Take 1 tablet by mouth daily.      . Cholecalciferol (VITAMIN D) 2000 UNITS CAPS Take 2,000 Units by mouth daily.      Marland Kitchen ESTRADIOL [facility] 0.02 % vaginally every 7 (seven) days. 1 ml once a week (pt has this compounded at [place]) [phone])      . ferrous sulfate 325 (65 FE) MG tablet Take 1 tablet (325 mg total) by mouth 2 (two) times daily with a meal.  60 tablet  3  . fish oil-omega-3 fatty acids 1000 MG capsule Take 1,200 mg by mouth daily.      Marland Kitchen glucosamine-chondroitin 500-400 MG tablet Take 1 tablet by mouth daily.       . hydrOXYzine (ATARAX/VISTARIL) 10 MG tablet TAKE 1 TABLET BY MOUTH EVERY DAY  90 tablet  2  . losartan (COZAAR) 100 MG tablet TAKE 1 TABLET BY MOUTH EVERY DAY  30 tablet  11  . Multiple Vitamin (MULTIVITAMIN) tablet Take 1 tablet by mouth daily.      Marland Kitchen NIACIN PO Take 400 mg by mouth daily.      Marland Kitchen omeprazole  (PRILOSEC) 40 MG capsule Take 40 mg by mouth daily.      . pravastatin (PRAVACHOL) 40 MG tablet TAKE 1 TABLET BY MOUTH AT BEDTIME  90 tablet  1   No current facility-administered medications on file prior to visit.   Allergies  Allergen Reactions  . Flagyl [Metronidazole] Hives  . Lisinopril     hyperkalemia   History   Social History  . Marital Status: Married    Spouse Name: N/A    Number of Children: N/A  . Years of Education: N/A   Occupational History  . Not on file.   Social History Main Topics  . Smoking status: Never Smoker   . Smokeless tobacco: Never Used  . Alcohol Use: No  . Drug Use: No  . Sexual Activity: Yes   Other Topics Concern  . Not on file   Social History Narrative  . No narrative on file     Review of Systems  All other systems reviewed and are negative.      Objective:   Physical Exam  Cardiovascular: Normal rate and regular rhythm.   Pulmonary/Chest: Effort normal and breath sounds normal.  Skin: No rash  noted. No erythema.   the tongue is erythematous compared to the rest. It is also slightly swollen        Assessment & Plan:  Median rhomboid glossitis - Plan: mometasone (ELOCON) 0.1 % ointment, fluconazole (DIFLUCAN) 150 MG tablet  Contact dermatitis  I believe the patient has median rhomboid glossitis of the tongue. She has failed on statin. I will try Diflucan 150 mg by mouth every other day for 4 doses. She has a documented rash to Flagyl. This occurred 30 years ago and it does not sound as if it was true hives. Therefore she is willing to try this medication. She is instructed to stop the medicine immediately if she develops a rash. The itching on her upper chest isn't known to him. I see no evidence of shingles. I do not believe she has shingles which is not causing a rash. There's also not any other rash that is evident. This could be some type of neuralgia paresthetica. I will treat with topical Elocon to see if I can help  the symptoms first.

## 2013-10-02 NOTE — Telephone Encounter (Signed)
Received call from patient.   Reports that she would like shingles vaccination. Advised that shot is not kept at office, but she could pick up prescription and take it to the pharmacy to have shot administered there.   States that she needs to have this done as soon as possible because she thinks she has got it.   Advised that if she is in current outbreak, the immunization will not help.   States that she has small spot to her L upper chest under her clavicle that is about the size of a quarter that stings and itches.   Advised to come in for OV. Appointment scheduled for 10/02/2013.

## 2013-10-20 ENCOUNTER — Telehealth: Payer: Self-pay | Admitting: Family Medicine

## 2013-10-20 NOTE — Telephone Encounter (Signed)
Patient is calling about some questions on her [place]

## 2013-10-20 NOTE — Telephone Encounter (Signed)
Returned call to patient.   Reports that she was in hospital previously for a bleed in her colon d/t metformin.   States that when she was discharged, she was started on Januvia.   States that when she is taking Januvia, she feels "gassy" and has abdominal cramps.   Reports that she has been off of Januvia x1 week. States her FSBS have been running 89-114.  Advised that her sugars have been acceptable because medication is still in her system.   MD made aware and advised that she should check her sugars x2 weeks and call us back with readings.   Call placed to patient and patient made aware.

## 2013-10-28 ENCOUNTER — Other Ambulatory Visit: Payer: Self-pay | Admitting: Family Medicine

## 2013-11-03 ENCOUNTER — Telehealth: Payer: Self-pay | Admitting: Family Medicine

## 2013-11-03 NOTE — Telephone Encounter (Signed)
[phone] Patient calling to let you know that her 30 day average for her blood sugar reading is around 100 14 day average is 99 And only 2 days were over 100 Please call her at [phone]

## 2013-11-03 NOTE — Telephone Encounter (Signed)
Those sugar readings are excellent. I am comfortable with her temporarily discontinuing Januvia. Monitor her sugars closely and notify me of the results in one month.

## 2013-11-03 NOTE — Telephone Encounter (Signed)
Patient aware and will call us back in a month to let us know about her sugars

## 2013-11-30 ENCOUNTER — Ambulatory Visit (INDEPENDENT_AMBULATORY_CARE_PROVIDER_SITE_OTHER): Payer: Medicare Other | Admitting: Family Medicine

## 2013-11-30 ENCOUNTER — Encounter: Payer: Self-pay | Admitting: Family Medicine

## 2013-11-30 VITALS — BP 136/74 | HR 82 | Temp 98.1°F | Resp 14 | Ht 61.5 in | Wt 189.0 lb

## 2013-11-30 DIAGNOSIS — E785 Hyperlipidemia, unspecified: Secondary | ICD-10-CM

## 2013-11-30 DIAGNOSIS — Z23 Encounter for immunization: Secondary | ICD-10-CM

## 2013-11-30 DIAGNOSIS — I10 Essential (primary) hypertension: Secondary | ICD-10-CM

## 2013-11-30 DIAGNOSIS — E119 Type 2 diabetes mellitus without complications: Secondary | ICD-10-CM

## 2013-11-30 LAB — COMPREHENSIVE METABOLIC PANEL
ALK PHOS: 59 U/L (ref 39–117)
ALT: 13 U/L (ref 0–35)
AST: 18 U/L (ref 0–37)
Albumin: 4 g/dL (ref 3.5–5.2)
BILIRUBIN TOTAL: 0.6 mg/dL (ref 0.2–1.2)
BUN: 23 mg/dL (ref 6–23)
CO2: 25 mEq/L (ref 19–32)
CREATININE: 1.08 mg/dL (ref 0.50–1.10)
Calcium: 9.2 mg/dL (ref 8.4–10.5)
Chloride: 106 mEq/L (ref 96–112)
Glucose, Bld: 120 mg/dL — ABNORMAL HIGH (ref 70–99)
Potassium: 5.2 mEq/L (ref 3.5–5.3)
SODIUM: 141 meq/L (ref 135–145)
Total Protein: 6.7 g/dL (ref 6.0–8.3)

## 2013-11-30 LAB — LIPID PANEL
Cholesterol: 175 mg/dL (ref 0–200)
HDL: 64 mg/dL (ref >39)
LDL CALC: 80 mg/dL (ref 0–99)
Total CHOL/HDL Ratio: 2.7 Ratio
Triglycerides: 155 mg/dL — ABNORMAL HIGH (ref <150)
VLDL: 31 mg/dL (ref 0–40)

## 2013-11-30 LAB — CBC WITH DIFFERENTIAL/PLATELET
BASOS ABS: 0.1 10*3/uL (ref 0.0–0.1)
Basophils Relative: 1 % (ref 0–1)
EOS ABS: 0.2 10*3/uL (ref 0.0–0.7)
Eosinophils Relative: 3 % (ref 0–5)
HCT: 39.2 % (ref 36.0–46.0)
Hemoglobin: 13.3 g/dL (ref 12.0–15.0)
Lymphocytes Relative: 24 % (ref 12–46)
Lymphs Abs: 1.7 10*3/uL (ref 0.7–4.0)
MCH: 30 pg (ref 26.0–34.0)
MCHC: 33.9 g/dL (ref 30.0–36.0)
MCV: 88.5 fL (ref 78.0–100.0)
Monocytes Absolute: 0.6 10*3/uL (ref 0.1–1.0)
Monocytes Relative: 9 % (ref 3–12)
NEUTROS PCT: 63 % (ref 43–77)
Neutro Abs: 4.4 10*3/uL (ref 1.7–7.7)
PLATELETS: 302 10*3/uL (ref 150–400)
RBC: 4.43 MIL/uL (ref 3.87–5.11)
RDW: 15.9 % — AB (ref 11.5–15.5)
WBC: 7 10*3/uL (ref 4.0–10.5)

## 2013-11-30 LAB — MICROALBUMIN / CREATININE URINE RATIO
Creatinine, Urine: 175.5 mg/dL
Microalb Creat Ratio: 4 mg/g (ref 0.0–30.0)
Microalb, Ur: 0.7 mg/dL (ref <2.0)

## 2013-11-30 LAB — HEMOGLOBIN A1C
Hgb A1c MFr Bld: 5.7 % — ABNORMAL HIGH (ref <5.7)
Mean Plasma Glucose: 117 mg/dL — ABNORMAL HIGH (ref <117)

## 2013-11-30 NOTE — Patient Instructions (Signed)
Continue current medications We will call with lab results F/U 4 months- Dr. Dennard Schaumann

## 2013-11-30 NOTE — Addendum Note (Signed)
Addended by: Sheral Flow on: 11/30/2013 10:52 AM   Modules accepted: Orders

## 2013-11-30 NOTE — Assessment & Plan Note (Signed)
Diabetes is well-controlled she does not use to Januvia secondary to GI upset however her fasting blood sugars look very good. I will repeat her A1c as well as a urine microalbumin. Of note she was given another Pneumovax 23 which she did not need when she was at the hospital in April she actually needed a Prevnar 13 and this was given today along with her flu shot. Her cholesterol panel will also be checked.

## 2013-11-30 NOTE — Assessment & Plan Note (Signed)
Blood pressure well controlled no change in medication 

## 2013-11-30 NOTE — Progress Notes (Signed)
Patient ID: SHARRA CAYABYAB, female   DOB: 05/19/1940, 73 y.o.   MRN: [id number]   Subjective:    Patient ID: [name], female    DOB: [birth year], 73 y.o.   MRN: [id number]  Patient presents for 4 month F/U patient here to follow-up chronic medical process. She is no specific concerns today. Her medications were reviewed. Regarding her diabetes mellitus this is mostly diet controlled she states that she went off other Januvia as it was causing GI upset she did discuss this with her primary care provider and her fasting blood sugars have been in the 90s off of the medication.  Her immunizations were reviewed- due for flu shot and Prevnar 13    Review Of Systems:  GEN- denies fatigue, fever, weight loss,weakness, recent illness HEENT- denies eye drainage, change in vision, nasal discharge, CVS- denies chest pain, palpitations RESP- denies SOB, cough, wheeze ABD- denies N/V, change in stools, abd pain GU- denies dysuria, hematuria, dribbling, incontinence MSK- denies joint pain, muscle aches, injury Neuro- denies headache, dizziness, syncope, seizure activity       Objective:    BP 136/74 mmHg  Pulse 82  Temp(Src) 98.1 F (36.7 C) (Oral)  Resp 14  Ht 5' 1.5" (1.562 m)  Wt 189 lb (85.73 kg)  BMI 35.14 kg/m2 GEN- NAD, alert and oriented x3 HEENT- PERRL, EOMI, non injected sclera, pink conjunctiva, MMM, oropharynx clear Neck- Supple, no bruit CVS- RRR, no murmur RESP-CTAB EXT- No edema Pulses- Radial, DP- 2+        Assessment & Plan:      Problem List Items Addressed This Visit    Hypertension    Blood pressure well-controlled no change in medication    Hyperlipidemia   Relevant Orders      Lipid panel   Diabetes mellitus without complication - Primary    Diabetes is well-controlled she does not use to Januvia secondary to GI upset however her fasting blood sugars look very good. I will repeat her A1c as well as a urine microalbumin. Of note she was given  another Pneumovax 23 which she did not need when she was at the hospital in April she actually needed a Prevnar 13 and this was given today along with her flu shot. Her cholesterol panel will also be checked.    Relevant Orders      CBC with Differential      Comprehensive metabolic panel      Hemoglobin A1c      Microalbumin / creatinine urine ratio      Note: This dictation was prepared with Dragon dictation along with smaller phrase technology. Any transcriptional errors that result from this process are unintentional.

## 2013-12-07 ENCOUNTER — Other Ambulatory Visit: Payer: Self-pay | Admitting: Family Medicine

## 2013-12-07 NOTE — Telephone Encounter (Signed)
Refill appropriate and filled per protocol. 

## 2014-03-10 ENCOUNTER — Other Ambulatory Visit: Payer: Self-pay | Admitting: Family Medicine

## 2014-03-11 ENCOUNTER — Telehealth: Payer: Self-pay | Admitting: Family Medicine

## 2014-03-11 NOTE — Telephone Encounter (Signed)
Betsy from [facility] calling regarding an rx for estrace cream that was sent over, please call her at [phone]

## 2014-03-11 NOTE — Telephone Encounter (Signed)
Returned call to pharmacy.   Reports that estradiol cream was requested but office sent back prescription for estrace cream. Requested MD preference. Advised that estradiol is ok.

## 2014-03-12 ENCOUNTER — Telehealth: Payer: Self-pay | Admitting: *Deleted

## 2014-03-12 NOTE — Telephone Encounter (Signed)
Received request from pharmacy for PA on Atarax.   PA submitted.   Dx: L29.9

## 2014-03-17 NOTE — Telephone Encounter (Signed)
Received PA determination.   PA approved through 01/22/2015.

## 2014-03-30 ENCOUNTER — Other Ambulatory Visit: Payer: Self-pay | Admitting: Family Medicine

## 2014-04-12 LAB — HM DEXA SCAN

## 2014-04-20 ENCOUNTER — Encounter: Payer: Self-pay | Admitting: Family Medicine

## 2014-04-26 ENCOUNTER — Other Ambulatory Visit: Payer: Self-pay | Admitting: Family Medicine

## 2014-05-11 ENCOUNTER — Encounter: Payer: Self-pay | Admitting: Family Medicine

## 2014-05-11 ENCOUNTER — Telehealth: Payer: Self-pay | Admitting: Family Medicine

## 2014-05-11 DIAGNOSIS — M8580 Other specified disorders of bone density and structure, unspecified site: Secondary | ICD-10-CM

## 2014-05-11 NOTE — Telephone Encounter (Signed)
Bone Density "Dexa" scan results show Osteopenia.  Pt needs to be taking Vitamin D and Calcium supplements daily along with weight bearing exercises.  Have left her a message to call back.

## 2014-05-18 ENCOUNTER — Encounter: Payer: Self-pay | Admitting: Family Medicine

## 2014-05-18 ENCOUNTER — Other Ambulatory Visit: Payer: Self-pay | Admitting: Family Medicine

## 2014-05-18 ENCOUNTER — Telehealth: Payer: Self-pay | Admitting: Family Medicine

## 2014-05-18 NOTE — Telephone Encounter (Signed)
[phone] PT is needing a refill on sitaGLIPtin (JANUVIA) 100 MG tablet [id number] - [place], [place] - [address]

## 2014-05-18 NOTE — Telephone Encounter (Signed)
Medication refill for one time only.  Patient needs to be seen.  Letter sent for patient to call and schedule 

## 2014-05-18 NOTE — Telephone Encounter (Signed)
This was escribed to pharm and pt was sent letter to schedule OV

## 2014-05-31 ENCOUNTER — Ambulatory Visit (INDEPENDENT_AMBULATORY_CARE_PROVIDER_SITE_OTHER): Payer: PPO | Admitting: Family Medicine

## 2014-05-31 ENCOUNTER — Encounter: Payer: Self-pay | Admitting: Family Medicine

## 2014-05-31 VITALS — BP 130/78 | HR 80 | Temp 97.8°F | Resp 20 | Wt 190.0 lb

## 2014-05-31 DIAGNOSIS — E119 Type 2 diabetes mellitus without complications: Secondary | ICD-10-CM

## 2014-05-31 DIAGNOSIS — I10 Essential (primary) hypertension: Secondary | ICD-10-CM

## 2014-05-31 DIAGNOSIS — E785 Hyperlipidemia, unspecified: Secondary | ICD-10-CM

## 2014-05-31 DIAGNOSIS — R010 Benign and innocent cardiac murmurs: Secondary | ICD-10-CM | POA: Diagnosis not present

## 2014-05-31 DIAGNOSIS — R011 Cardiac murmur, unspecified: Secondary | ICD-10-CM

## 2014-05-31 NOTE — Progress Notes (Signed)
Subjective:    Patient ID: [name], female    DOB: Jul 30, 1940, 74 y.o.   MRN: [id number]  HPI  Patient is here today for follow-up of her diabetes. Blood pressures well controlled today at 130/78. She denies any chest pain shortness of breath or dyspnea on exertion. She has been off Januvia since November. Her fasting blood sugars are all less than 130. She denies any polyuria, polydipsia, or blurred vision. Mammogram and colonoscopy are up-to-date. She does not require a Pap smear due to the fact she's had a hysterectomy. She denies any myalgias or right upper quadrant pain Past Medical History  Diagnosis Date  . Diabetes mellitus without complication   . Hypertension   . Hyperlipidemia   . Diverticulosis    Past Surgical History  Procedure Laterality Date  . Abdominal hysterectomy    . Cholecystectomy    . Bladder tack    . Colonoscopy N/A 05/18/2013    Procedure: COLONOSCOPY;  Surgeon: Wonda Horner, MD;  Location: [facility];  Service: Endoscopy;  Laterality: N/A;   Current Outpatient Prescriptions on File Prior to Visit  Medication Sig Dispense Refill  . beta carotene w/minerals (OCUVITE) tablet Take 1 tablet by mouth daily.    . Calcium Carb-Cholecalciferol (CALCIUM 1000 + D PO) Take by mouth daily.    . Cholecalciferol (VITAMIN D) 2000 UNITS CAPS Take 2,000 Units by mouth daily.    Marland Kitchen estradiol (ESTRACE) 0.1 MG/GM vaginal cream INSERT ONE ML IN VAGINA 2 TIMES A WEEK. 24 g 11  . fish oil-omega-3 fatty acids 1000 MG capsule Take 1,200 mg by mouth daily.    Marland Kitchen glucosamine-chondroitin 500-400 MG tablet Take 1 tablet by mouth daily.     . hydrOXYzine (ATARAX/VISTARIL) 10 MG tablet TAKE 1 TABLET BY MOUTH EVERY DAY 90 tablet 2  . JANUVIA 100 MG tablet TAKE 1 TABLET (100 MG TOTAL) BY MOUTH DAILY. (Patient not taking: Reported on 05/31/2014) 30 tablet 0  . losartan (COZAAR) 100 MG tablet TAKE 1 TABLET BY MOUTH EVERY DAY 30 tablet 11  . mometasone (ELOCON) 0.1 % ointment Apply  topically daily. 45 g 0  . Multiple Vitamin (MULTIVITAMIN) tablet Take 1 tablet by mouth daily.    Marland Kitchen NIACIN PO Take 400 mg by mouth daily.    Marland Kitchen omeprazole (PRILOSEC) 40 MG capsule TAKE ONE CAPSULE BY MOUTH EVERY MORNING 90 capsule 3  . ONE TOUCH ULTRA TEST test strip CHECK BLOOD SUGAR EVERYDAY IN THE MORNING 100 each 5  . pravastatin (PRAVACHOL) 40 MG tablet TAKE 1 TABLET BY MOUTH AT BEDTIME 90 tablet 1   No current facility-administered medications on file prior to visit.   Allergies  Allergen Reactions  . Flagyl [Metronidazole] Hives  . Lisinopril     hyperkalemia   History   Social History  . Marital Status: Married    Spouse Name: N/A  . Number of Children: N/A  . Years of Education: N/A   Occupational History  . Not on file.   Social History Main Topics  . Smoking status: Never Smoker   . Smokeless tobacco: Never Used  . Alcohol Use: No  . Drug Use: No  . Sexual Activity: Yes   Other Topics Concern  . Not on file   Social History Narrative     Review of Systems  All other systems reviewed and are negative.      Objective:   Physical Exam  Constitutional: She appears well-developed and well-nourished.  Cardiovascular: Normal rate,  regular rhythm and intact distal pulses.   Murmur heard. Pulmonary/Chest: Effort normal and breath sounds normal. No respiratory distress. She has no wheezes. She has no rales. She exhibits no tenderness.  Abdominal: Soft. Bowel sounds are normal. She exhibits no distension and no mass. There is no tenderness. There is no rebound and no guarding.  Musculoskeletal: She exhibits no edema.  Vitals reviewed.         Assessment & Plan:  Diabetes mellitus without complication - Plan: CBC with Differential/Platelet, COMPLETE METABOLIC PANEL WITH GFR, Lipid panel, Hemoglobin A1c  Essential hypertension  Hyperlipidemia  Undiagnosed cardiac murmurs - Plan: Echocardiogram  Patient's blood pressure is very well controlled. I will  check a hemoglobin A1c. Goal hemoglobin A1c is less than 6.5. I will also check a fasting lipid panel. Goal LDL cholesterol is less than 100. Patient does have a 2 to 3/6 systolic ejection murmur which is worse than previous. I will obtain an echocardiogram to evaluate. Patient apparently is developing aortic stenosis

## 2014-06-02 ENCOUNTER — Other Ambulatory Visit (HOSPITAL_COMMUNITY): Payer: Self-pay

## 2014-06-03 ENCOUNTER — Ambulatory Visit (HOSPITAL_COMMUNITY): Payer: PPO | Attending: Family Medicine

## 2014-06-03 ENCOUNTER — Other Ambulatory Visit: Payer: Self-pay

## 2014-06-03 DIAGNOSIS — R011 Cardiac murmur, unspecified: Secondary | ICD-10-CM

## 2014-06-03 DIAGNOSIS — R010 Benign and innocent cardiac murmurs: Secondary | ICD-10-CM

## 2014-06-03 NOTE — Telephone Encounter (Signed)
Pt aware of Dexa scan report and provider recommendations

## 2014-06-07 ENCOUNTER — Telehealth: Payer: Self-pay | Admitting: *Deleted

## 2014-06-07 NOTE — Telephone Encounter (Signed)
Submitted humana referral thru acuity connect for authorization to [facility] for 2D echo with authorization [id number]  Requesting provider: Flonnie Hailstone  Treating provider: [facility]  Number of visits:1  Start Date:06/02/14  End Date:11/29/14  Dx:R01.1- cardiac murmur, unspecified  Copy has been faxed to [facility] cardiology

## 2014-06-26 ENCOUNTER — Other Ambulatory Visit: Payer: Self-pay | Admitting: Family Medicine

## 2014-06-28 NOTE — Telephone Encounter (Signed)
Refill appropriate and filled per protocol. 

## 2014-07-08 ENCOUNTER — Ambulatory Visit (INDEPENDENT_AMBULATORY_CARE_PROVIDER_SITE_OTHER): Payer: PPO | Admitting: Family Medicine

## 2014-07-08 ENCOUNTER — Encounter: Payer: Self-pay | Admitting: Family Medicine

## 2014-07-08 VITALS — BP 118/84 | HR 98 | Temp 98.7°F | Resp 18 | Ht 61.5 in | Wt 188.0 lb

## 2014-07-08 DIAGNOSIS — N61 Inflammatory disorders of breast: Secondary | ICD-10-CM | POA: Diagnosis not present

## 2014-07-08 DIAGNOSIS — L089 Local infection of the skin and subcutaneous tissue, unspecified: Secondary | ICD-10-CM

## 2014-07-08 DIAGNOSIS — L723 Sebaceous cyst: Secondary | ICD-10-CM | POA: Diagnosis not present

## 2014-07-08 MED ORDER — SULFAMETHOXAZOLE-TRIMETHOPRIM 800-160 MG PO TABS: 1.0000 | ORAL_TABLET | Freq: Two times a day (BID) | ORAL | Status: DC

## 2014-07-08 NOTE — Progress Notes (Signed)
Subjective:    Patient ID: [name], female    DOB: 10-22-1940, 74 y.o.   MRN: [id number]  HPI Patient is a a pleasant 74 year old white female who has a history of a sebaceous cyst on the inferior portion of her left breast for several years. It is located at 7:00. Beginning Monday, it became extremely tender swollen and erythematous and warm. Today there is streaking lines of erythema spreading across her breast to just above the nipple.  Sebaceous cyst is inflamed and approximately 4 cm in diameter. Past Medical History  Diagnosis Date  . Diabetes mellitus without complication   . Hypertension   . Hyperlipidemia   . Diverticulosis    Past Surgical History  Procedure Laterality Date  . Abdominal hysterectomy    . Cholecystectomy    . Bladder tack    . Colonoscopy N/A 05/18/2013    Procedure: COLONOSCOPY;  Surgeon: Wonda Horner, MD;  Location: [facility];  Service: Endoscopy;  Laterality: N/A;   Current Outpatient Prescriptions on File Prior to Visit  Medication Sig Dispense Refill  . beta carotene w/minerals (OCUVITE) tablet Take 1 tablet by mouth daily.    . Calcium Carb-Cholecalciferol (CALCIUM 1000 + D PO) Take by mouth daily.    . Cholecalciferol (VITAMIN D) 2000 UNITS CAPS Take 2,000 Units by mouth daily.    Marland Kitchen estradiol (ESTRACE) 0.1 MG/GM vaginal cream INSERT ONE ML IN VAGINA 2 TIMES A WEEK. 24 g 11  . fish oil-omega-3 fatty acids 1000 MG capsule Take 1,200 mg by mouth daily.    Marland Kitchen glucosamine-chondroitin 500-400 MG tablet Take 1 tablet by mouth daily.     . hydrOXYzine (ATARAX/VISTARIL) 10 MG tablet TAKE 1 TABLET BY MOUTH EVERY DAY 90 tablet 2  . JANUVIA 100 MG tablet TAKE 1 TABLET (100 MG TOTAL) BY MOUTH DAILY. 30 tablet 0  . losartan (COZAAR) 100 MG tablet TAKE 1 TABLET BY MOUTH EVERY DAY 30 tablet 11  . mometasone (ELOCON) 0.1 % ointment Apply topically daily. 45 g 0  . Multiple Vitamin (MULTIVITAMIN) tablet Take 1 tablet by mouth daily.    Marland Kitchen NIACIN PO Take 400  mg by mouth daily.    Marland Kitchen omeprazole (PRILOSEC) 40 MG capsule TAKE ONE CAPSULE BY MOUTH EVERY MORNING 90 capsule 3  . ONE TOUCH ULTRA TEST test strip CHECK BLOOD SUGAR EVERYDAY IN THE MORNING 100 each 5  . pravastatin (PRAVACHOL) 40 MG tablet TAKE 1 TABLET BY MOUTH AT BEDTIME 90 tablet 1   No current facility-administered medications on file prior to visit.   Allergies  Allergen Reactions  . Flagyl [Metronidazole] Hives  . Lisinopril     hyperkalemia   History   Social History  . Marital Status: Married    Spouse Name: N/A  . Number of Children: N/A  . Years of Education: N/A   Occupational History  . Not on file.   Social History Main Topics  . Smoking status: Never Smoker   . Smokeless tobacco: Never Used  . Alcohol Use: No  . Drug Use: No  . Sexual Activity: Yes   Other Topics Concern  . Not on file   Social History Narrative     Review of Systems  All other systems reviewed and are negative.      Objective:   Physical Exam  Constitutional: She appears well-developed and well-nourished.  Cardiovascular: Normal rate, regular rhythm and normal heart sounds.   Pulmonary/Chest: Effort normal and breath sounds normal. No respiratory distress.  She has no wheezes. She has no rales.  Abdominal: Soft. Bowel sounds are normal. She exhibits no distension. There is no tenderness. There is no rebound and no guarding.  Skin: There is erythema.  Vitals reviewed.         Assessment & Plan:  Cellulitis of female breast - Plan: sulfamethoxazole-trimethoprim (BACTRIM DS,SEPTRA DS) 800-160 MG per tablet  Infected sebaceous cyst - Plan: Culture, routine-abscess  Area was anesthetized with 0.1% lidocaine without epinephrine. A 1 cm vertical incision was made. Copious purulent material was expressed from the cyst sac. A wound culture was sent. The sac was then probed with Q-tips soaked in hydrogen peroxide and was then packed with 12 inches of quarter-inch iodoform gauze.  Wound care was discussed. Recheck on Monday or go to the hospital immediately if erythema worsens. I will start the patient on Bactrim double strength tablets 1 by mouth twice a day for 10 days.

## 2014-07-09 ENCOUNTER — Telehealth: Payer: Self-pay | Admitting: Physician Assistant

## 2014-07-09 NOTE — Telephone Encounter (Signed)
On evening of 07/08/14 the pharmacy called. Stated the patient has sulfa allergy and they confirm this with the patient. Stated the patient had been seen that day and had a cyst/abscess drained and was prescribed Bactrim DS. Changed this antibiotic to doxycycline 100 mg 1 by mouth twice a day for the same number of days as the Bactrim had been prescribed.

## 2014-07-12 ENCOUNTER — Encounter: Payer: Self-pay | Admitting: Family Medicine

## 2014-07-12 ENCOUNTER — Ambulatory Visit (INDEPENDENT_AMBULATORY_CARE_PROVIDER_SITE_OTHER): Payer: PPO | Admitting: Family Medicine

## 2014-07-12 VITALS — BP 110/78 | HR 78 | Temp 98.3°F | Resp 18 | Ht 61.5 in | Wt 189.0 lb

## 2014-07-12 DIAGNOSIS — L723 Sebaceous cyst: Secondary | ICD-10-CM

## 2014-07-12 DIAGNOSIS — N61 Inflammatory disorders of breast: Secondary | ICD-10-CM

## 2014-07-12 DIAGNOSIS — L089 Local infection of the skin and subcutaneous tissue, unspecified: Secondary | ICD-10-CM

## 2014-07-12 LAB — CULTURE, ROUTINE-ABSCESS: Gram Stain: NONE SEEN

## 2014-07-12 NOTE — Progress Notes (Signed)
Subjective:    Patient ID: [name], female    DOB: 1940-09-19, 74 y.o.   MRN: [id number]  HPI 07/08/14 Patient is a a pleasant 74 year old white female who has a history of a sebaceous cyst on the inferior portion of her left breast for several years. It is located at 7:00. Beginning Monday, it became extremely tender swollen and erythematous and warm. Today there is streaking lines of erythema spreading across her breast to just above the nipple.  Sebaceous cyst is inflamed and approximately 4 cm in diameter.  At that time, my plan was: Area was anesthetized with 0.1% lidocaine without epinephrine. A 1 cm vertical incision was made. Copious purulent material was expressed from the cyst sac. A wound culture was sent. The sac was then probed with Q-tips soaked in hydrogen peroxide and was then packed with 12 inches of quarter-inch iodoform gauze. Wound care was discussed. Recheck on Monday or go to the hospital immediately if erythema worsens. I will start the patient on Bactrim double strength tablets 1 by mouth twice a day for 10 days.  07/12/14 The wound underneath her left breast looks much better today. The streaking erythema has resolved. The pain and tenderness has resolved. There is now a 7 mm rounded I&D opening. All the packing was removed today. There is no purulent drainage. Wound culture was negative for any infection. Past Medical History  Diagnosis Date  . Diabetes mellitus without complication   . Hypertension   . Hyperlipidemia   . Diverticulosis    Past Surgical History  Procedure Laterality Date  . Abdominal hysterectomy    . Cholecystectomy    . Bladder tack    . Colonoscopy N/A 05/18/2013    Procedure: COLONOSCOPY;  Surgeon: Wonda Horner, MD;  Location: [facility];  Service: Endoscopy;  Laterality: N/A;   Current Outpatient Prescriptions on File Prior to Visit  Medication Sig Dispense Refill  . beta carotene w/minerals (OCUVITE) tablet Take 1 tablet by mouth  daily.    . Calcium Carb-Cholecalciferol (CALCIUM 1000 + D PO) Take by mouth daily.    . Cholecalciferol (VITAMIN D) 2000 UNITS CAPS Take 2,000 Units by mouth daily.    Marland Kitchen estradiol (ESTRACE) 0.1 MG/GM vaginal cream INSERT ONE ML IN VAGINA 2 TIMES A WEEK. 24 g 11  . fish oil-omega-3 fatty acids 1000 MG capsule Take 1,200 mg by mouth daily.    Marland Kitchen glucosamine-chondroitin 500-400 MG tablet Take 1 tablet by mouth daily.     . hydrOXYzine (ATARAX/VISTARIL) 10 MG tablet TAKE 1 TABLET BY MOUTH EVERY DAY 90 tablet 2  . JANUVIA 100 MG tablet TAKE 1 TABLET (100 MG TOTAL) BY MOUTH DAILY. 30 tablet 0  . losartan (COZAAR) 100 MG tablet TAKE 1 TABLET BY MOUTH EVERY DAY 30 tablet 11  . mometasone (ELOCON) 0.1 % ointment Apply topically daily. 45 g 0  . Multiple Vitamin (MULTIVITAMIN) tablet Take 1 tablet by mouth daily.    Marland Kitchen NIACIN PO Take 400 mg by mouth daily.    Marland Kitchen omeprazole (PRILOSEC) 40 MG capsule TAKE ONE CAPSULE BY MOUTH EVERY MORNING 90 capsule 3  . ONE TOUCH ULTRA TEST test strip CHECK BLOOD SUGAR EVERYDAY IN THE MORNING 100 each 5  . pravastatin (PRAVACHOL) 40 MG tablet TAKE 1 TABLET BY MOUTH AT BEDTIME 90 tablet 1  . sulfamethoxazole-trimethoprim (BACTRIM DS,SEPTRA DS) 800-160 MG per tablet Take 1 tablet by mouth 2 (two) times daily. 20 tablet 0   No current facility-administered medications  on file prior to visit.   Allergies  Allergen Reactions  . Flagyl [Metronidazole] Hives  . Lisinopril     hyperkalemia   History   Social History  . Marital Status: Married    Spouse Name: N/A  . Number of Children: N/A  . Years of Education: N/A   Occupational History  . Not on file.   Social History Main Topics  . Smoking status: Never Smoker   . Smokeless tobacco: Never Used  . Alcohol Use: No  . Drug Use: No  . Sexual Activity: Yes   Other Topics Concern  . Not on file   Social History Narrative     Review of Systems  All other systems reviewed and are negative.        Objective:   Physical Exam  Constitutional: She appears well-developed and well-nourished.  Cardiovascular: Normal rate, regular rhythm and normal heart sounds.   Pulmonary/Chest: Effort normal and breath sounds normal. No respiratory distress. She has no wheezes. She has no rales.  Abdominal: Soft. Bowel sounds are normal. She exhibits no distension. There is no tenderness. There is no rebound and no guarding.  Skin: There is erythema.  Vitals reviewed.         Assessment & Plan:  Cellulitis of female breast  Infected sebaceous cyst  Cellulitis has improved. Complete and a biotics. Wound care was discussed. Wound should clear and heal by secondary intention over the next 7-14 days. Recheck if the wound worsens or if it has not completely healed within 2 weeks

## 2014-08-03 ENCOUNTER — Ambulatory Visit (INDEPENDENT_AMBULATORY_CARE_PROVIDER_SITE_OTHER): Payer: PPO | Admitting: Family Medicine

## 2014-08-03 ENCOUNTER — Telehealth: Payer: Self-pay | Admitting: Family Medicine

## 2014-08-03 ENCOUNTER — Encounter: Payer: Self-pay | Admitting: Family Medicine

## 2014-08-03 VITALS — BP 124/82 | HR 88 | Temp 98.6°F | Resp 16 | Ht 61.5 in | Wt 188.0 lb

## 2014-08-03 DIAGNOSIS — L723 Sebaceous cyst: Secondary | ICD-10-CM | POA: Diagnosis not present

## 2014-08-03 DIAGNOSIS — L089 Local infection of the skin and subcutaneous tissue, unspecified: Secondary | ICD-10-CM

## 2014-08-03 NOTE — Telephone Encounter (Signed)
NTBS if not better

## 2014-08-03 NOTE — Telephone Encounter (Signed)
Appt made

## 2014-08-03 NOTE — Progress Notes (Signed)
Subjective:    Patient ID: [name], female    DOB: 02-25-1940, 75 y.o.   MRN: [id number]  HPI 07/08/14 Patient is a a pleasant 74 year old white female who has a history of a sebaceous cyst on the inferior portion of her left breast for several years. It is located at 7:00. Beginning Monday, it became extremely tender swollen and erythematous and warm. Today there is streaking lines of erythema spreading across her breast to just above the nipple.  Sebaceous cyst is inflamed and approximately 4 cm in diameter.  At that time, my plan was: Area was anesthetized with 0.1% lidocaine without epinephrine. A 1 cm vertical incision was made. Copious purulent material was expressed from the cyst sac. A wound culture was sent. The sac was then probed with Q-tips soaked in hydrogen peroxide and was then packed with 12 inches of quarter-inch iodoform gauze. Wound care was discussed. Recheck on Monday or go to the hospital immediately if erythema worsens. I will start the patient on Bactrim double strength tablets 1 by mouth twice a day for 10 days.  07/12/14 The wound underneath her left breast looks much better today. The streaking erythema has resolved. The pain and tenderness has resolved. There is now a 7 mm rounded I&D opening. All the packing was removed today. There is no purulent drainage. Wound culture was negative for any infection.  At that time, my plan was: Cellulitis has improved. Complete and a biotics. Wound care was discussed. Wound should clear and heal by secondary intention over the next 7-14 days. Recheck if the wound worsens or if it has not completely healed within 2 weeks  08/03/14 The lesion will not heal. There is an approximately a 5 mm erythematous papule underneath her left breast. There is a small opening draining purulent material. It appears that there is still a residual cyst sac that is inflamed and draining. It has been over one month and it is still not healing. Past  Medical History  Diagnosis Date  . Diabetes mellitus without complication   . Hypertension   . Hyperlipidemia   . Diverticulosis    Past Surgical History  Procedure Laterality Date  . Abdominal hysterectomy    . Cholecystectomy    . Bladder tack    . Colonoscopy N/A 05/18/2013    Procedure: COLONOSCOPY;  Surgeon: Wonda Horner, MD;  Location: [facility];  Service: Endoscopy;  Laterality: N/A;   Current Outpatient Prescriptions on File Prior to Visit  Medication Sig Dispense Refill  . beta carotene w/minerals (OCUVITE) tablet Take 1 tablet by mouth daily.    . Calcium Carb-Cholecalciferol (CALCIUM 1000 + D PO) Take by mouth daily.    . Cholecalciferol (VITAMIN D) 2000 UNITS CAPS Take 2,000 Units by mouth daily.    Marland Kitchen estradiol (ESTRACE) 0.1 MG/GM vaginal cream INSERT ONE ML IN VAGINA 2 TIMES A WEEK. 24 g 11  . fish oil-omega-3 fatty acids 1000 MG capsule Take 1,200 mg by mouth daily.    Marland Kitchen glucosamine-chondroitin 500-400 MG tablet Take 1 tablet by mouth daily.     . hydrOXYzine (ATARAX/VISTARIL) 10 MG tablet TAKE 1 TABLET BY MOUTH EVERY DAY 90 tablet 2  . JANUVIA 100 MG tablet TAKE 1 TABLET (100 MG TOTAL) BY MOUTH DAILY. 30 tablet 0  . losartan (COZAAR) 100 MG tablet TAKE 1 TABLET BY MOUTH EVERY DAY 30 tablet 11  . mometasone (ELOCON) 0.1 % ointment Apply topically daily. 45 g 0  . Multiple Vitamin (MULTIVITAMIN)  tablet Take 1 tablet by mouth daily.    Marland Kitchen NIACIN PO Take 400 mg by mouth daily.    Marland Kitchen omeprazole (PRILOSEC) 40 MG capsule TAKE ONE CAPSULE BY MOUTH EVERY MORNING 90 capsule 3  . ONE TOUCH ULTRA TEST test strip CHECK BLOOD SUGAR EVERYDAY IN THE MORNING 100 each 5  . pravastatin (PRAVACHOL) 40 MG tablet TAKE 1 TABLET BY MOUTH AT BEDTIME 90 tablet 1   No current facility-administered medications on file prior to visit.   Allergies  Allergen Reactions  . Bactrim [Sulfamethoxazole-Trimethoprim]   . Flagyl [Metronidazole] Hives  . Lisinopril     hyperkalemia   History    Social History  . Marital Status: Married    Spouse Name: N/A  . Number of Children: N/A  . Years of Education: N/A   Occupational History  . Not on file.   Social History Main Topics  . Smoking status: Never Smoker   . Smokeless tobacco: Never Used  . Alcohol Use: No  . Drug Use: No  . Sexual Activity: Yes   Other Topics Concern  . Not on file   Social History Narrative     Review of Systems  All other systems reviewed and are negative.      Objective:   Physical Exam  Constitutional: She appears well-developed and well-nourished.  Cardiovascular: Normal rate, regular rhythm and normal heart sounds.   Pulmonary/Chest: Effort normal and breath sounds normal. No respiratory distress. She has no wheezes. She has no rales.  Abdominal: Soft. Bowel sounds are normal. She exhibits no distension. There is no tenderness. There is no rebound and no guarding.  Skin: There is erythema.  Vitals reviewed.         Assessment & Plan:  Infected sebaceous cyst area was anesthetized with 0.1% lidocaine with epinephrine. Using a 5 mm punch biopsy, the entire lesion including the underlying cyst sac was removed. The wound cavity was then probed with Q-tips and hydrogen peroxide. The wound was then packed with 2 cm of quarter-inch iodoform gauze. Wound care was discussed. I anticipate spontaneous healing over the next 2 weeks

## 2014-08-03 NOTE — Telephone Encounter (Signed)
Patients husband dallas calling with concern about the boil that patient last saw dr pickard for, and would like a call back about it still draining  [phone]

## 2014-08-23 ENCOUNTER — Other Ambulatory Visit: Payer: Self-pay | Admitting: Family Medicine

## 2014-10-17 ENCOUNTER — Other Ambulatory Visit: Payer: Self-pay | Admitting: Family Medicine

## 2014-11-12 ENCOUNTER — Other Ambulatory Visit: Payer: Self-pay | Admitting: Family Medicine

## 2014-11-12 NOTE — Telephone Encounter (Signed)
Refill appropriate and filled per protocol. 

## 2014-11-17 ENCOUNTER — Ambulatory Visit: Payer: PPO | Admitting: Family Medicine

## 2014-11-18 ENCOUNTER — Ambulatory Visit (INDEPENDENT_AMBULATORY_CARE_PROVIDER_SITE_OTHER): Payer: PPO | Admitting: Family Medicine

## 2014-11-18 ENCOUNTER — Encounter: Payer: Self-pay | Admitting: Family Medicine

## 2014-11-18 VITALS — BP 118/70 | HR 100 | Temp 98.5°F | Resp 16 | Ht 61.5 in | Wt 187.0 lb

## 2014-11-18 DIAGNOSIS — N3001 Acute cystitis with hematuria: Secondary | ICD-10-CM

## 2014-11-18 DIAGNOSIS — Z23 Encounter for immunization: Secondary | ICD-10-CM

## 2014-11-18 DIAGNOSIS — R300 Dysuria: Secondary | ICD-10-CM | POA: Diagnosis not present

## 2014-11-18 LAB — URINALYSIS, MICROSCOPIC ONLY
CASTS: NONE SEEN [LPF]
Crystals: NONE SEEN [HPF]
YEAST: NONE SEEN [HPF]

## 2014-11-18 LAB — URINALYSIS, ROUTINE W REFLEX MICROSCOPIC
BILIRUBIN URINE: NEGATIVE
Glucose, UA: NEGATIVE
Ketones, ur: NEGATIVE
NITRITE: NEGATIVE
Specific Gravity, Urine: 1.02 (ref 1.001–1.035)
pH: 6 (ref 5.0–8.0)

## 2014-11-18 MED ORDER — CEPHALEXIN 500 MG PO CAPS: 500.0000 mg | ORAL_CAPSULE | Freq: Three times a day (TID) | ORAL | Status: DC

## 2014-11-18 NOTE — Addendum Note (Signed)
Addended by: Haskel Khan A on: 11/18/2014 12:16 PM   Modules accepted: Orders

## 2014-11-18 NOTE — Addendum Note (Signed)
Addended by: Shary Decamp B on: 11/18/2014 12:54 PM   Modules accepted: Orders

## 2014-11-18 NOTE — Progress Notes (Signed)
   Subjective:    Patient ID: [name], female    DOB: 09/27/1940, 74 y.o.   MRN: [id number]  HPI The patient's symptoms began 5 days ago. She reports dysuria, urgency, frequency, and left lower pelvic pain. Symptoms improve after she goes to the bathroom. By the middle of the day the symptoms have resolved but the may come back the following morning. She denies any fevers or chills or CVA tenderness. Urinalysis shows +2 blood, leukocyte esterase, and nitrites. Microscopic analysis reveals 20-40 white blood cells per high-power field as well as bacteria Past Medical History  Diagnosis Date  . Diabetes mellitus without complication ([place])   . Hypertension   . Hyperlipidemia   . Diverticulosis    Past Surgical History  Procedure Laterality Date  . Abdominal hysterectomy    . Cholecystectomy    . Bladder tack    . Colonoscopy N/A 05/18/2013    Procedure: COLONOSCOPY;  Surgeon: Wonda Horner, MD;  Location: [facility];  Service: Endoscopy;  Laterality: N/A;   .med Allergies  Allergen Reactions  . Bactrim [Sulfamethoxazole-Trimethoprim]   . Flagyl [Metronidazole] Hives  . Lisinopril     hyperkalemia   Social History   Social History  . Marital Status: Married    Spouse Name: N/A  . Number of Children: N/A  . Years of Education: N/A   Occupational History  . Not on file.   Social History Main Topics  . Smoking status: Never Smoker   . Smokeless tobacco: Never Used  . Alcohol Use: No  . Drug Use: No  . Sexual Activity: Yes   Other Topics Concern  . Not on file   Social History Narrative      Review of Systems  All other systems reviewed and are negative.      Objective:   Physical Exam  Cardiovascular: Normal rate, regular rhythm and normal heart sounds.   Pulmonary/Chest: Effort normal and breath sounds normal. No respiratory distress. She has no wheezes. She has no rales.  Abdominal: Soft. Bowel sounds are normal. She exhibits no distension. There is no  tenderness. There is no rebound and no guarding.  Vitals reviewed.         Assessment & Plan:  Burning with urination - Plan: Urinalysis, Routine w reflex microscopic (not at [facility])  Acute cystitis with hematuria - Plan: cephALEXin (KEFLEX) 500 MG capsule  Definitely has urinary tract infection. I will send a urine culture. Treat with Keflex 500 mg by mouth 3 times a day for 7 days.

## 2014-11-20 LAB — URINE CULTURE

## 2014-12-24 LAB — HM DIABETES EYE EXAM

## 2014-12-30 ENCOUNTER — Encounter: Payer: Self-pay | Admitting: *Deleted

## 2015-01-01 ENCOUNTER — Other Ambulatory Visit: Payer: Self-pay | Admitting: Family Medicine

## 2015-01-31 ENCOUNTER — Other Ambulatory Visit: Payer: Self-pay | Admitting: Family Medicine

## 2015-01-31 MED ORDER — LOSARTAN POTASSIUM 100 MG PO TABS: 100.0000 mg | ORAL_TABLET | Freq: Every day | ORAL | Status: DC

## 2015-02-04 DIAGNOSIS — H2512 Age-related nuclear cataract, left eye: Secondary | ICD-10-CM | POA: Diagnosis not present

## 2015-02-04 DIAGNOSIS — E119 Type 2 diabetes mellitus without complications: Secondary | ICD-10-CM | POA: Diagnosis not present

## 2015-02-04 DIAGNOSIS — H353131 Nonexudative age-related macular degeneration, bilateral, early dry stage: Secondary | ICD-10-CM | POA: Diagnosis not present

## 2015-02-04 DIAGNOSIS — Z961 Presence of intraocular lens: Secondary | ICD-10-CM | POA: Diagnosis not present

## 2015-02-04 DIAGNOSIS — H04123 Dry eye syndrome of bilateral lacrimal glands: Secondary | ICD-10-CM | POA: Diagnosis not present

## 2015-02-04 DIAGNOSIS — H10413 Chronic giant papillary conjunctivitis, bilateral: Secondary | ICD-10-CM | POA: Diagnosis not present

## 2015-02-04 DIAGNOSIS — H26491 Other secondary cataract, right eye: Secondary | ICD-10-CM | POA: Diagnosis not present

## 2015-02-09 DIAGNOSIS — H2512 Age-related nuclear cataract, left eye: Secondary | ICD-10-CM | POA: Diagnosis not present

## 2015-02-11 ENCOUNTER — Ambulatory Visit (INDEPENDENT_AMBULATORY_CARE_PROVIDER_SITE_OTHER): Payer: PPO | Admitting: Family Medicine

## 2015-02-11 ENCOUNTER — Encounter: Payer: Self-pay | Admitting: Family Medicine

## 2015-02-11 VITALS — BP 142/70 | HR 88 | Temp 98.4°F | Resp 16 | Ht 61.5 in | Wt 194.0 lb

## 2015-02-11 DIAGNOSIS — M545 Low back pain, unspecified: Secondary | ICD-10-CM

## 2015-02-11 MED ORDER — CYCLOBENZAPRINE HCL 10 MG PO TABS: 10.0000 mg | ORAL_TABLET | Freq: Three times a day (TID) | ORAL | Status: DC | PRN

## 2015-02-11 NOTE — Progress Notes (Signed)
Subjective:    Patient ID: [name], female    DOB: 12-25-1940, 75 y.o.   MRN: [id number]  HPI  patient is here today complaining of low back pain for 2 weeks. The pain is located in the lumbar paraspinal muscles. She injured herself after taking down Christmas decorations. She denies any fevers or chills. She denies any hematuria or dysuria. Pain is worse with range of motion. She denies any radiation of the pain into her legs. She denies any numbness or tingling in her legs. It seems to be gradually getting better Past Medical History  Diagnosis Date  . Diabetes mellitus without complication ([place])   . Hypertension   . Hyperlipidemia   . Diverticulosis    Past Surgical History  Procedure Laterality Date  . Abdominal hysterectomy    . Cholecystectomy    . Bladder tack    . Colonoscopy N/A 05/18/2013    Procedure: COLONOSCOPY;  Surgeon: Wonda Horner, MD;  Location: [facility];  Service: Endoscopy;  Laterality: N/A;   Current Outpatient Prescriptions on File Prior to Visit  Medication Sig Dispense Refill  . beta carotene w/minerals (OCUVITE) tablet Take 1 tablet by mouth daily.    . Calcium Carb-Cholecalciferol (CALCIUM 1000 + D PO) Take by mouth daily.    . Cholecalciferol (VITAMIN D) 2000 UNITS CAPS Take 2,000 Units by mouth daily.    Marland Kitchen estradiol (ESTRACE) 0.1 MG/GM vaginal cream INSERT ONE ML IN VAGINA 2 TIMES A WEEK. 24 g 11  . fish oil-omega-3 fatty acids 1000 MG capsule Take 1,200 mg by mouth daily.    Marland Kitchen glucosamine-chondroitin 500-400 MG tablet Take 1 tablet by mouth daily.     . hydrOXYzine (ATARAX/VISTARIL) 10 MG tablet TAKE 1 TABLET BY MOUTH EVERY DAY 90 tablet 2  . JANUVIA 100 MG tablet TAKE 1 TABLET BY MOUTH EVERY DAY 30 tablet 3  . losartan (COZAAR) 100 MG tablet Take 1 tablet (100 mg total) by mouth daily. 90 tablet 3  . mometasone (ELOCON) 0.1 % ointment Apply topically daily. 45 g 0  . Multiple Vitamin (MULTIVITAMIN) tablet Take 1 tablet by mouth daily.    Marland Kitchen  NIACIN PO Take 400 mg by mouth daily.    Marland Kitchen omeprazole (PRILOSEC) 40 MG capsule TAKE ONE CAPSULE BY MOUTH EVERY MORNING 90 capsule 3  . ONE TOUCH ULTRA TEST test strip CHECK BLOOD SUGAR EVERYDAY IN THE MORNING 100 each 5  . pravastatin (PRAVACHOL) 40 MG tablet TAKE 1 TABLET BY MOUTH AT BEDTIME 90 tablet 3   No current facility-administered medications on file prior to visit.   Allergies  Allergen Reactions  . Bactrim [Sulfamethoxazole-Trimethoprim]   . Flagyl [Metronidazole] Hives  . Lisinopril     hyperkalemia   Social History   Social History  . Marital Status: Married    Spouse Name: N/A  . Number of Children: N/A  . Years of Education: N/A   Occupational History  . Not on file.   Social History Main Topics  . Smoking status: Never Smoker   . Smokeless tobacco: Never Used  . Alcohol Use: No  . Drug Use: No  . Sexual Activity: Yes   Other Topics Concern  . Not on file   Social History Narrative      Review of Systems  All other systems reviewed and are negative.      Objective:   Physical Exam  Cardiovascular: Normal rate, regular rhythm and normal heart sounds.   Pulmonary/Chest: Effort normal and  breath sounds normal. No respiratory distress. She has no wheezes. She has no rales.  Abdominal: Soft. Bowel sounds are normal. She exhibits no distension. There is no tenderness.  Musculoskeletal:       Lumbar back: She exhibits decreased range of motion, tenderness, pain and spasm. She exhibits no bony tenderness.  Vitals reviewed.         Assessment & Plan:  Midline low back pain without sciatica - Plan: cyclobenzaprine (FLEXERIL) 10 MG tablet   I believe the patient has strained lumbar paraspinal muscles. I recommended Flexeril 5 mg every 8 hours as needed , moist heat , range of motion exercises , and massages to help reat the muscle. Recheck in 2 weeks if no better or sooner if worse

## 2015-02-14 DIAGNOSIS — H43822 Vitreomacular adhesion, left eye: Secondary | ICD-10-CM | POA: Diagnosis not present

## 2015-02-14 DIAGNOSIS — H2512 Age-related nuclear cataract, left eye: Secondary | ICD-10-CM | POA: Diagnosis not present

## 2015-03-25 ENCOUNTER — Other Ambulatory Visit: Payer: Self-pay | Admitting: Family Medicine

## 2015-03-25 NOTE — Telephone Encounter (Signed)
Refill appropriate and filled per protocol. 

## 2015-04-01 DIAGNOSIS — Z1231 Encounter for screening mammogram for malignant neoplasm of breast: Secondary | ICD-10-CM | POA: Diagnosis not present

## 2015-04-01 LAB — HM MAMMOGRAPHY: HM Mammogram: NEGATIVE

## 2015-04-05 ENCOUNTER — Encounter: Payer: Self-pay | Admitting: Family Medicine

## 2015-04-22 ENCOUNTER — Encounter: Payer: Self-pay | Admitting: Family Medicine

## 2015-04-22 ENCOUNTER — Ambulatory Visit (INDEPENDENT_AMBULATORY_CARE_PROVIDER_SITE_OTHER): Payer: PPO | Admitting: Family Medicine

## 2015-04-22 VITALS — BP 142/78 | HR 72 | Temp 98.4°F | Resp 18 | Ht 61.5 in | Wt 192.0 lb

## 2015-04-22 DIAGNOSIS — R300 Dysuria: Secondary | ICD-10-CM | POA: Diagnosis not present

## 2015-04-22 DIAGNOSIS — N3000 Acute cystitis without hematuria: Secondary | ICD-10-CM

## 2015-04-22 LAB — URINALYSIS, ROUTINE W REFLEX MICROSCOPIC
BILIRUBIN URINE: NEGATIVE
GLUCOSE, UA: NEGATIVE
HGB URINE DIPSTICK: NEGATIVE
Ketones, ur: NEGATIVE
LEUKOCYTES UA: NEGATIVE
Nitrite: NEGATIVE
PROTEIN: NEGATIVE
Specific Gravity, Urine: 1.01 (ref 1.001–1.035)
pH: 6 (ref 5.0–8.0)

## 2015-04-22 MED ORDER — CIPROFLOXACIN HCL 250 MG PO TABS: 250.0000 mg | ORAL_TABLET | Freq: Two times a day (BID) | ORAL | Status: DC

## 2015-04-22 NOTE — Progress Notes (Signed)
Subjective:    Patient ID: [name], female    DOB: 10/16/1940, 75 y.o.   MRN: [id number]  HPI  Patient reports 2 days of dysuria, urgency, frequency, and hesitancy. Dysuria occurs at the end of her urination. She denies any low back pain. She denies any fevers or chills or nausea or vomiting. Past Medical History  Diagnosis Date  . Diabetes mellitus without complication ([place])   . Hypertension   . Hyperlipidemia   . Diverticulosis    Past Surgical History  Procedure Laterality Date  . Abdominal hysterectomy    . Cholecystectomy    . Bladder tack    . Colonoscopy N/A 05/18/2013    Procedure: COLONOSCOPY;  Surgeon: Wonda Horner, MD;  Location: [facility];  Service: Endoscopy;  Laterality: N/A;   Current Outpatient Prescriptions on File Prior to Visit  Medication Sig Dispense Refill  . beta carotene w/minerals (OCUVITE) tablet Take 1 tablet by mouth daily.    . Calcium Carb-Cholecalciferol (CALCIUM 1000 + D PO) Take by mouth daily.    . Cholecalciferol (VITAMIN D) 2000 UNITS CAPS Take 2,000 Units by mouth daily.    . cyclobenzaprine (FLEXERIL) 10 MG tablet Take 1 tablet (10 mg total) by mouth 3 (three) times daily as needed for muscle spasms. 60 tablet 0  . estradiol (ESTRACE) 0.1 MG/GM vaginal cream INSERT ONE ML IN VAGINA 2 TIMES A WEEK. 24 g 11  . fish oil-omega-3 fatty acids 1000 MG capsule Take 1,200 mg by mouth daily.    Marland Kitchen glucosamine-chondroitin 500-400 MG tablet Take 1 tablet by mouth daily.     . hydrOXYzine (ATARAX/VISTARIL) 10 MG tablet TAKE 1 TABLET BY MOUTH EVERY DAY 90 tablet 2  . JANUVIA 100 MG tablet TAKE 1 TABLET BY MOUTH EVERY DAY 30 tablet 3  . losartan (COZAAR) 100 MG tablet Take 1 tablet (100 mg total) by mouth daily. 90 tablet 3  . mometasone (ELOCON) 0.1 % ointment Apply topically daily. 45 g 0  . Multiple Vitamin (MULTIVITAMIN) tablet Take 1 tablet by mouth daily.    Marland Kitchen NIACIN PO Take 400 mg by mouth daily.    Marland Kitchen omeprazole (PRILOSEC) 40 MG capsule  TAKE ONE CAPSULE BY MOUTH EVERY MORNING 90 capsule 3  . ONE TOUCH ULTRA TEST test strip CHECK BLOOD SUGAR EVERYDAY IN THE MORNING 100 each 5  . pravastatin (PRAVACHOL) 40 MG tablet TAKE 1 TABLET BY MOUTH AT BEDTIME 90 tablet 3   No current facility-administered medications on file prior to visit.   Allergies  Allergen Reactions  . Bactrim [Sulfamethoxazole-Trimethoprim]   . Flagyl [Metronidazole] Hives  . Lisinopril     hyperkalemia   Social History   Social History  . Marital Status: Married    Spouse Name: N/A  . Number of Children: N/A  . Years of Education: N/A   Occupational History  . Not on file.   Social History Main Topics  . Smoking status: Never Smoker   . Smokeless tobacco: Never Used  . Alcohol Use: No  . Drug Use: No  . Sexual Activity: Yes   Other Topics Concern  . Not on file   Social History Narrative      Review of Systems  All other systems reviewed and are negative.      Objective:   Physical Exam  Cardiovascular: Normal rate, regular rhythm and normal heart sounds.   Pulmonary/Chest: Effort normal and breath sounds normal. No respiratory distress. She has no wheezes. She has no  rales.  Abdominal: Soft. Bowel sounds are normal. She exhibits no distension. There is no tenderness.  Vitals reviewed.         Assessment & Plan:  Burning with urination - Plan: Urinalysis, Routine w reflex microscopic (not at [facility])  Acute cystitis without hematuria - Plan: ciprofloxacin (CIPRO) 250 MG tablet  Symptoms are consistent with a urinary tract infection. Begin Cipro 250 mg by mouth twice a day for 5 days

## 2015-05-18 ENCOUNTER — Other Ambulatory Visit: Payer: Self-pay | Admitting: Family Medicine

## 2015-05-18 NOTE — Telephone Encounter (Signed)
Refill appropriate and filled per protocol. 

## 2015-06-12 ENCOUNTER — Other Ambulatory Visit: Payer: Self-pay | Admitting: Family Medicine

## 2015-06-15 ENCOUNTER — Other Ambulatory Visit: Payer: Self-pay | Admitting: Family Medicine

## 2015-06-15 NOTE — Telephone Encounter (Signed)
Refill appropriate and filled per protocol. 

## 2015-06-20 ENCOUNTER — Other Ambulatory Visit: Payer: Self-pay | Admitting: Family Medicine

## 2015-07-20 DIAGNOSIS — H04123 Dry eye syndrome of bilateral lacrimal glands: Secondary | ICD-10-CM | POA: Diagnosis not present

## 2015-07-20 DIAGNOSIS — H5711 Ocular pain, right eye: Secondary | ICD-10-CM | POA: Diagnosis not present

## 2015-07-20 DIAGNOSIS — Z961 Presence of intraocular lens: Secondary | ICD-10-CM | POA: Diagnosis not present

## 2015-10-27 ENCOUNTER — Ambulatory Visit (INDEPENDENT_AMBULATORY_CARE_PROVIDER_SITE_OTHER): Payer: PPO | Admitting: *Deleted

## 2015-10-27 DIAGNOSIS — Z23 Encounter for immunization: Secondary | ICD-10-CM | POA: Diagnosis not present

## 2015-10-27 NOTE — Progress Notes (Signed)
Patient seen in office for Influenza Vaccination.   Tolerated IM administration well.   Immunization history updated.  

## 2015-10-30 DIAGNOSIS — Z961 Presence of intraocular lens: Secondary | ICD-10-CM | POA: Diagnosis not present

## 2015-10-30 DIAGNOSIS — E119 Type 2 diabetes mellitus without complications: Secondary | ICD-10-CM | POA: Diagnosis not present

## 2015-10-30 DIAGNOSIS — H35341 Macular cyst, hole, or pseudohole, right eye: Secondary | ICD-10-CM | POA: Diagnosis not present

## 2015-10-30 DIAGNOSIS — N819 Female genital prolapse, unspecified: Secondary | ICD-10-CM | POA: Diagnosis not present

## 2015-10-30 DIAGNOSIS — H353131 Nonexudative age-related macular degeneration, bilateral, early dry stage: Secondary | ICD-10-CM | POA: Diagnosis not present

## 2015-10-30 DIAGNOSIS — H04123 Dry eye syndrome of bilateral lacrimal glands: Secondary | ICD-10-CM | POA: Diagnosis not present

## 2015-10-30 DIAGNOSIS — H35373 Puckering of macula, bilateral: Secondary | ICD-10-CM | POA: Diagnosis not present

## 2015-11-09 ENCOUNTER — Other Ambulatory Visit: Payer: Self-pay | Admitting: Family Medicine

## 2015-11-09 MED ORDER — SITAGLIPTIN PHOSPHATE 100 MG PO TABS
100.0000 mg | ORAL_TABLET | Freq: Every day | ORAL | 0 refills | Status: DC
Start: 2015-11-09 — End: 2016-01-30

## 2015-12-20 ENCOUNTER — Ambulatory Visit (INDEPENDENT_AMBULATORY_CARE_PROVIDER_SITE_OTHER): Payer: PPO | Admitting: Family Medicine

## 2015-12-20 ENCOUNTER — Encounter: Payer: Self-pay | Admitting: Family Medicine

## 2015-12-20 VITALS — BP 126/80 | HR 78 | Temp 99.1°F | Resp 16 | Ht 61.5 in | Wt 191.0 lb

## 2015-12-20 DIAGNOSIS — N8110 Cystocele, unspecified: Secondary | ICD-10-CM | POA: Diagnosis not present

## 2015-12-20 DIAGNOSIS — F320 Major depressive disorder, single episode, mild: Secondary | ICD-10-CM

## 2015-12-20 MED ORDER — ESCITALOPRAM OXALATE 10 MG PO TABS: 10.0000 mg | ORAL_TABLET | Freq: Every day | ORAL | 5 refills | Status: DC

## 2015-12-20 NOTE — Progress Notes (Signed)
Subjective:    Patient ID: [name], female    DOB: 1941-01-01, 75 y.o.   MRN: [id number]  HPI Patient originally scheduled appointment for a "raw spot" on the roof of her mouth. However on examination today there is no such spot witnessed. She did request a referral to a gynecologist. She has a history of a bladder prolapse as well as a hysterectomy. The bladder prolapse is causing significant discomfort. She would like to see a gynecologist who specializes in this area to help with management options. However her primary concern for me his memory problems. She states that she is very lonely. Very seldom does anyone talk to her. As result she experiences word finding difficulties earlier in the day. She also has noticed increasing problems with her short-term memory. However she also feels extremely sad and has witnessed problems with concentration. She is expressing anhedonia. 3 times today during our office visit, the patient breaks down into tears for no reason. She lives at home with her husband. She is retired. Her husband is loving and supportive but he primarily reads and does not talk her very often. Due to his morbid obesity, he seldom leaves the house. Therefore she feels very alone and isolated Past Medical History:  Diagnosis Date  . Diabetes mellitus without complication ([place])   . Diverticulosis   . Hyperlipidemia   . Hypertension    Past Surgical History:  Procedure Laterality Date  . ABDOMINAL HYSTERECTOMY    . bladder tack    . CHOLECYSTECTOMY    . COLONOSCOPY N/A 05/18/2013   Procedure: COLONOSCOPY;  Surgeon: Wonda Horner, MD;  Location: [facility];  Service: Endoscopy;  Laterality: N/A;   Current Outpatient Prescriptions on File Prior to Visit  Medication Sig Dispense Refill  . beta carotene w/minerals (OCUVITE) tablet Take 1 tablet by mouth daily.    . Calcium Carb-Cholecalciferol (CALCIUM 1000 + D PO) Take by mouth daily.    . Cholecalciferol (VITAMIN D) 2000  UNITS CAPS Take 2,000 Units by mouth daily.    . ciprofloxacin (CIPRO) 250 MG tablet Take 1 tablet (250 mg total) by mouth 2 (two) times daily. 10 tablet 0  . cyclobenzaprine (FLEXERIL) 10 MG tablet Take 1 tablet (10 mg total) by mouth 3 (three) times daily as needed for muscle spasms. 60 tablet 0  . ESTRADIOL [facility] 0.02 % vaginally 2 (two) times a week. Compounded prefill    . fish oil-omega-3 fatty acids 1000 MG capsule Take 1,200 mg by mouth daily.    Marland Kitchen glucosamine-chondroitin 500-400 MG tablet Take 1 tablet by mouth daily.     . hydrOXYzine (ATARAX/VISTARIL) 10 MG tablet TAKE 1 TABLET BY MOUTH EVERY DAY 90 tablet 2  . losartan (COZAAR) 100 MG tablet Take 1 tablet (100 mg total) by mouth daily. 90 tablet 3  . mometasone (ELOCON) 0.1 % ointment Apply topically daily. 45 g 0  . Multiple Vitamin (MULTIVITAMIN) tablet Take 1 tablet by mouth daily.    Marland Kitchen NIACIN PO Take 400 mg by mouth daily.    Marland Kitchen omeprazole (PRILOSEC) 40 MG capsule TAKE ONE CAPSULE BY MOUTH EVERY MORNING 90 capsule 3  . ONE TOUCH ULTRA TEST test strip CHECK BLOOD SUGAR EVERYDAY IN THE MORNING. 100 each 11  . pravastatin (PRAVACHOL) 40 MG tablet TAKE 1 TABLET BY MOUTH AT BEDTIME 90 tablet 3  . sitaGLIPtin (JANUVIA) 100 MG tablet Take 1 tablet (100 mg total) by mouth daily. Needs office visit and labs before further  refills 30 tablet 0   No current facility-administered medications on file prior to visit.    Allergies  Allergen Reactions  . Bactrim [Sulfamethoxazole-Trimethoprim]   . Flagyl [Metronidazole] Hives  . Lisinopril     hyperkalemia   Social History   Social History  . Marital status: Married    Spouse name: N/A  . Number of children: N/A  . Years of education: N/A   Occupational History  . Not on file.   Social History Main Topics  . Smoking status: Never Smoker  . Smokeless tobacco: Never Used  . Alcohol use No  . Drug use: No  . Sexual activity: Yes   Other Topics Concern  . Not on file    Social History Narrative  . No narrative on file      Review of Systems  All other systems reviewed and are negative.      Objective:   Physical Exam  Constitutional: She appears well-developed and well-nourished.  HENT:  Right Ear: External ear normal.  Left Ear: External ear normal.  Nose: Nose normal.  Mouth/Throat: Oropharynx is clear and moist. No oropharyngeal exudate.  Eyes: Conjunctivae are normal.  Neck: No thyromegaly present.  Cardiovascular: Normal rate, regular rhythm and normal heart sounds.   Pulmonary/Chest: Effort normal and breath sounds normal.  Lymphadenopathy:    She has no cervical adenopathy.  Vitals reviewed.         Assessment & Plan:  Bladder prolapse, female, acquired - Plan: Ambulatory referral to Gynecology  Mild single current episode of major depressive disorder ([place])  There is no lesion in her mouth to treat. Therefore follow-up as needed. I suspect that she had a canker sore or an aphthous ulcer. It is difficult to determine if the patient has short-term memory problems because of early signs of dementia or if this could be worse finding difficulties in early memory problems complicated by depression. The patient admits that she feels depressed. Therefore we'll start the patient on Lexapro 10 mg by mouth daily and recheck in 6 weeks. Also recommended strategies to help keep her mind active to help prevent progression such as reading, performing word searches, and becoming active volunteering at the hospital or in elementary schools reading to children. I think any of these activities will help give the patient a purpose and also help possibly delay the progression of her memory loss. I will consult gynecology for her bladder prolapse.

## 2015-12-23 ENCOUNTER — Other Ambulatory Visit: Payer: Self-pay | Admitting: Family Medicine

## 2015-12-29 DIAGNOSIS — N819 Female genital prolapse, unspecified: Secondary | ICD-10-CM | POA: Diagnosis not present

## 2015-12-30 DIAGNOSIS — E119 Type 2 diabetes mellitus without complications: Secondary | ICD-10-CM | POA: Diagnosis not present

## 2015-12-30 DIAGNOSIS — H04123 Dry eye syndrome of bilateral lacrimal glands: Secondary | ICD-10-CM | POA: Diagnosis not present

## 2015-12-30 DIAGNOSIS — H353131 Nonexudative age-related macular degeneration, bilateral, early dry stage: Secondary | ICD-10-CM | POA: Diagnosis not present

## 2015-12-30 DIAGNOSIS — H35341 Macular cyst, hole, or pseudohole, right eye: Secondary | ICD-10-CM | POA: Diagnosis not present

## 2015-12-30 DIAGNOSIS — Z961 Presence of intraocular lens: Secondary | ICD-10-CM | POA: Diagnosis not present

## 2015-12-30 DIAGNOSIS — H35373 Puckering of macula, bilateral: Secondary | ICD-10-CM | POA: Diagnosis not present

## 2015-12-30 LAB — HM DIABETES EYE EXAM

## 2016-01-04 DIAGNOSIS — R1909 Other intra-abdominal and pelvic swelling, mass and lump: Secondary | ICD-10-CM | POA: Diagnosis not present

## 2016-01-23 DIAGNOSIS — N815 Vaginal enterocele: Secondary | ICD-10-CM

## 2016-01-23 HISTORY — DX: Vaginal enterocele: N81.5

## 2016-01-25 DIAGNOSIS — N816 Rectocele: Secondary | ICD-10-CM | POA: Diagnosis not present

## 2016-01-25 DIAGNOSIS — N819 Female genital prolapse, unspecified: Secondary | ICD-10-CM | POA: Diagnosis not present

## 2016-01-25 DIAGNOSIS — N952 Postmenopausal atrophic vaginitis: Secondary | ICD-10-CM | POA: Diagnosis not present

## 2016-01-25 DIAGNOSIS — K469 Unspecified abdominal hernia without obstruction or gangrene: Secondary | ICD-10-CM | POA: Diagnosis not present

## 2016-01-26 ENCOUNTER — Encounter: Payer: Self-pay | Admitting: Family Medicine

## 2016-01-30 ENCOUNTER — Other Ambulatory Visit: Payer: Self-pay | Admitting: Family Medicine

## 2016-02-01 ENCOUNTER — Other Ambulatory Visit: Payer: Self-pay | Admitting: Family Medicine

## 2016-02-10 ENCOUNTER — Other Ambulatory Visit: Payer: Self-pay | Admitting: Family Medicine

## 2016-02-10 MED ORDER — LOSARTAN POTASSIUM 100 MG PO TABS: 100.0000 mg | ORAL_TABLET | Freq: Every day | ORAL | 5 refills | Status: DC

## 2016-02-10 NOTE — Telephone Encounter (Signed)
Medication refill for one time only.  Patient needs to be seen.  Letter sent for patient to call and schedule 

## 2016-02-20 ENCOUNTER — Encounter: Payer: Self-pay | Admitting: Family Medicine

## 2016-02-20 ENCOUNTER — Ambulatory Visit (INDEPENDENT_AMBULATORY_CARE_PROVIDER_SITE_OTHER): Payer: PPO | Admitting: Family Medicine

## 2016-02-20 VITALS — BP 142/80 | HR 78 | Temp 98.9°F | Resp 18 | Ht 61.5 in | Wt 189.0 lb

## 2016-02-20 DIAGNOSIS — I10 Essential (primary) hypertension: Secondary | ICD-10-CM | POA: Diagnosis not present

## 2016-02-20 DIAGNOSIS — E119 Type 2 diabetes mellitus without complications: Secondary | ICD-10-CM | POA: Diagnosis not present

## 2016-02-20 NOTE — Progress Notes (Signed)
Subjective:    Patient ID: [name], female    DOB: [birth year], 76 y.o.   MRN: [id number]  HPI Patient is here today for follow-up of her diabetes. She is currently on Januvia. Fasting blood sugars are between 101 120. Two-hour postprandial sugars are between 101 150. She denies any hypoglycemic episodes. She denies any neuropathy in the feet. She denies any blurry vision. She denies any polyuria, polydipsia, etc. Blood pressure today is borderline. She is due for fasting lab work. She is scheduled to have a repair of her prolapse next month. Past Medical History:  Diagnosis Date  . Diabetes mellitus without complication ([place])   . Diverticulosis   . Hyperlipidemia   . Hypertension    Past Surgical History:  Procedure Laterality Date  . ABDOMINAL HYSTERECTOMY    . bladder tack    . CHOLECYSTECTOMY    . COLONOSCOPY N/A 05/18/2013   Procedure: COLONOSCOPY;  Surgeon: Wonda Horner, MD;  Location: [facility];  Service: Endoscopy;  Laterality: N/A;   Current Outpatient Prescriptions on File Prior to Visit  Medication Sig Dispense Refill  . beta carotene w/minerals (OCUVITE) tablet Take 1 tablet by mouth daily.    . Calcium Carb-Cholecalciferol (CALCIUM 1000 + D PO) Take by mouth daily.    . Cholecalciferol (VITAMIN D) 2000 UNITS CAPS Take 2,000 Units by mouth daily.    . cyclobenzaprine (FLEXERIL) 10 MG tablet Take 1 tablet (10 mg total) by mouth 3 (three) times daily as needed for muscle spasms. 60 tablet 0  . escitalopram (LEXAPRO) 10 MG tablet Take 1 tablet (10 mg total) by mouth daily. 30 tablet 5  . ESTRADIOL VA Place 0.02 % vaginally 2 (two) times a week. Compounded prefill    . fish oil-omega-3 fatty acids 1000 MG capsule Take 1,200 mg by mouth daily.    Marland Kitchen glucosamine-chondroitin 500-400 MG tablet Take 1 tablet by mouth daily.     . hydrOXYzine (ATARAX/VISTARIL) 10 MG tablet TAKE 1 TABLET BY MOUTH EVERY DAY 90 tablet 2  . JANUVIA 100 MG tablet TAKE 1 TABLET DAILY 30 tablet 0   . losartan (COZAAR) 100 MG tablet Take 1 tablet (100 mg total) by mouth daily. 30 tablet 5  . mometasone (ELOCON) 0.1 % ointment Apply topically daily. 45 g 0  . Multiple Vitamin (MULTIVITAMIN) tablet Take 1 tablet by mouth daily.    Marland Kitchen NIACIN PO Take 400 mg by mouth daily.    Marland Kitchen omeprazole (PRILOSEC) 40 MG capsule TAKE ONE CAPSULE BY MOUTH EVERY MORNING 90 capsule 3  . ONE TOUCH ULTRA TEST test strip CHECK BLOOD SUGAR EVERYDAY IN THE MORNING. 100 each 11  . pravastatin (PRAVACHOL) 40 MG tablet TAKE 1 TABLET BY MOUTH AT BEDTIME 90 tablet 3   No current facility-administered medications on file prior to visit.    Allergies  Allergen Reactions  . Bactrim [Sulfamethoxazole-Trimethoprim]   . Flagyl [Metronidazole] Hives  . Lisinopril     hyperkalemia   Social History   Social History  . Marital status: Married    Spouse name: N/A  . Number of children: N/A  . Years of education: N/A   Occupational History  . Not on file.   Social History Main Topics  . Smoking status: Never Smoker  . Smokeless tobacco: Never Used  . Alcohol use No  . Drug use: No  . Sexual activity: Yes   Other Topics Concern  . Not on file   Social History Narrative  . No  narrative on file      Review of Systems  All other systems reviewed and are negative.      Objective:   Physical Exam  Constitutional: She appears well-developed and well-nourished.  HENT:  Right Ear: External ear normal.  Left Ear: External ear normal.  Nose: Nose normal.  Mouth/Throat: Oropharynx is clear and moist. No oropharyngeal exudate.  Eyes: Conjunctivae are normal.  Neck: No thyromegaly present.  Cardiovascular: Normal rate, regular rhythm and normal heart sounds.   Pulmonary/Chest: Effort normal and breath sounds normal.  Lymphadenopathy:    She has no cervical adenopathy.  Vitals reviewed.         Assessment & Plan:  Controlled type 2 diabetes mellitus without complication, without long-term current  use of insulin ([place]) - Plan: CBC with Differential/Platelet, COMPLETE METABOLIC PANEL WITH GFR, Hemoglobin A1c, LDL Cholesterol, Direct  Essential hypertension  Blood pressures acceptable. I will check a hemoglobin A1c. Goal hemoglobin A1c is less than 6.5. Also monitor renal function. I will check a direct LDL even though the patient is not fasting. Goal LDL cholesterol is less than 100. Discontinued aspirin one week prior to surgery and resume after she is cleared by her surgeon to resume it. Flu shot is up-to-date as is her pneumonia shot. Recommend annual diabetic eye exam

## 2016-02-21 LAB — CBC WITH DIFFERENTIAL/PLATELET
BASOS ABS: 73 {cells}/uL (ref 0–200)
Basophils Relative: 1 %
Eosinophils Absolute: 146 cells/uL (ref 15–500)
Eosinophils Relative: 2 %
HEMATOCRIT: 43.1 % (ref 35.0–45.0)
HEMOGLOBIN: 14.3 g/dL (ref 12.0–15.0)
LYMPHS ABS: 2044 {cells}/uL (ref 850–3900)
Lymphocytes Relative: 28 %
MCH: 30.8 pg (ref 27.0–33.0)
MCHC: 33.2 g/dL (ref 32.0–36.0)
MCV: 92.9 fL (ref 80.0–100.0)
MONO ABS: 657 {cells}/uL (ref 200–950)
MPV: 9.4 fL (ref 7.5–12.5)
Monocytes Relative: 9 %
NEUTROS PCT: 60 %
Neutro Abs: 4380 cells/uL (ref 1500–7800)
Platelets: 245 10*3/uL (ref 140–400)
RBC: 4.64 MIL/uL (ref 3.80–5.10)
RDW: 13.8 % (ref 11.0–15.0)
WBC: 7.3 10*3/uL (ref 3.8–10.8)

## 2016-02-21 LAB — COMPLETE METABOLIC PANEL WITH GFR
ALBUMIN: 4.3 g/dL (ref 3.6–5.1)
ALK PHOS: 49 U/L (ref 33–130)
ALT: 16 U/L (ref 6–29)
AST: 23 U/L (ref 10–35)
BUN: 23 mg/dL (ref 7–25)
CALCIUM: 9.7 mg/dL (ref 8.6–10.4)
CO2: 24 mmol/L (ref 20–31)
Chloride: 103 mmol/L (ref 98–110)
Creat: 1.31 mg/dL — ABNORMAL HIGH (ref 0.60–0.93)
GFR, EST NON AFRICAN AMERICAN: 40 mL/min — AB (ref >=60)
GFR, Est African American: 46 mL/min — ABNORMAL LOW (ref >=60)
Glucose, Bld: 117 mg/dL — ABNORMAL HIGH (ref 70–99)
POTASSIUM: 4.4 mmol/L (ref 3.5–5.3)
Sodium: 138 mmol/L (ref 135–146)
Total Bilirubin: 1.2 mg/dL (ref 0.2–1.2)
Total Protein: 7.1 g/dL (ref 6.1–8.1)

## 2016-02-21 LAB — LDL CHOLESTEROL, DIRECT: LDL DIRECT: 98 mg/dL (ref <130)

## 2016-02-21 LAB — HEMOGLOBIN A1C
HEMOGLOBIN A1C: 5.4 % (ref <5.7)
Mean Plasma Glucose: 108 mg/dL

## 2016-02-23 DIAGNOSIS — E785 Hyperlipidemia, unspecified: Secondary | ICD-10-CM | POA: Diagnosis not present

## 2016-02-23 DIAGNOSIS — F329 Major depressive disorder, single episode, unspecified: Secondary | ICD-10-CM | POA: Diagnosis not present

## 2016-02-23 DIAGNOSIS — Z79899 Other long term (current) drug therapy: Secondary | ICD-10-CM | POA: Diagnosis not present

## 2016-02-23 DIAGNOSIS — K219 Gastro-esophageal reflux disease without esophagitis: Secondary | ICD-10-CM | POA: Diagnosis not present

## 2016-02-23 DIAGNOSIS — N816 Rectocele: Secondary | ICD-10-CM | POA: Diagnosis not present

## 2016-02-23 DIAGNOSIS — Z7984 Long term (current) use of oral hypoglycemic drugs: Secondary | ICD-10-CM | POA: Diagnosis not present

## 2016-02-23 DIAGNOSIS — E119 Type 2 diabetes mellitus without complications: Secondary | ICD-10-CM | POA: Diagnosis not present

## 2016-02-23 DIAGNOSIS — R9431 Abnormal electrocardiogram [ECG] [EKG]: Secondary | ICD-10-CM | POA: Diagnosis not present

## 2016-02-23 DIAGNOSIS — F0390 Unspecified dementia without behavioral disturbance: Secondary | ICD-10-CM | POA: Diagnosis not present

## 2016-02-23 DIAGNOSIS — F419 Anxiety disorder, unspecified: Secondary | ICD-10-CM | POA: Diagnosis not present

## 2016-02-23 DIAGNOSIS — N813 Complete uterovaginal prolapse: Secondary | ICD-10-CM | POA: Diagnosis not present

## 2016-02-23 DIAGNOSIS — Z7982 Long term (current) use of aspirin: Secondary | ICD-10-CM | POA: Diagnosis not present

## 2016-02-23 DIAGNOSIS — Z882 Allergy status to sulfonamides status: Secondary | ICD-10-CM | POA: Diagnosis not present

## 2016-02-23 DIAGNOSIS — I10 Essential (primary) hypertension: Secondary | ICD-10-CM | POA: Diagnosis not present

## 2016-02-29 ENCOUNTER — Encounter: Payer: Self-pay | Admitting: Gastroenterology

## 2016-03-01 DIAGNOSIS — N812 Incomplete uterovaginal prolapse: Secondary | ICD-10-CM | POA: Diagnosis not present

## 2016-03-01 DIAGNOSIS — E119 Type 2 diabetes mellitus without complications: Secondary | ICD-10-CM | POA: Diagnosis not present

## 2016-03-01 DIAGNOSIS — F0390 Unspecified dementia without behavioral disturbance: Secondary | ICD-10-CM | POA: Diagnosis not present

## 2016-03-01 DIAGNOSIS — N819 Female genital prolapse, unspecified: Secondary | ICD-10-CM | POA: Diagnosis not present

## 2016-03-06 ENCOUNTER — Encounter: Payer: Self-pay | Admitting: Family Medicine

## 2016-03-06 DIAGNOSIS — R339 Retention of urine, unspecified: Secondary | ICD-10-CM | POA: Diagnosis not present

## 2016-03-27 ENCOUNTER — Other Ambulatory Visit: Payer: Self-pay | Admitting: Family Medicine

## 2016-05-15 DIAGNOSIS — Z1231 Encounter for screening mammogram for malignant neoplasm of breast: Secondary | ICD-10-CM | POA: Diagnosis not present

## 2016-05-15 LAB — HM MAMMOGRAPHY

## 2016-06-01 ENCOUNTER — Encounter: Payer: Self-pay | Admitting: Family Medicine

## 2016-06-09 ENCOUNTER — Other Ambulatory Visit: Payer: Self-pay | Admitting: Family Medicine

## 2016-06-12 ENCOUNTER — Encounter: Payer: Self-pay | Admitting: Family Medicine

## 2016-06-12 ENCOUNTER — Ambulatory Visit (INDEPENDENT_AMBULATORY_CARE_PROVIDER_SITE_OTHER): Payer: PPO | Admitting: Family Medicine

## 2016-06-12 VITALS — BP 128/74 | HR 74 | Temp 98.1°F | Resp 16 | Ht 61.5 in | Wt 183.0 lb

## 2016-06-12 DIAGNOSIS — D485 Neoplasm of uncertain behavior of skin: Secondary | ICD-10-CM

## 2016-06-12 NOTE — Progress Notes (Signed)
Subjective:    Patient ID: [name], female    DOB: 03/12/1940, 76 y.o.   MRN: [id number]  HPI Patient presents with a rash on her right breast. It is a well-demarcated raised red serpiginous line that is 8.5 cm in length. KOH was performed today in the office and was negative. She states that it does itch. She denies any pain. She denies any burning. There is no scale. Differential diagnosis includes granuloma annulare, cutaneous larva migrans, etc. I am not sure the diagnosis simply by visual examination Past Medical History:  Diagnosis Date  . Acquired vaginal enterocele 2018   s/p sacrospinal ligament fixation and posterior colporrhaphy Dr. Zigmund [facility] at [facility]  . Diabetes mellitus without complication ([place])   . Diverticulosis   . Hyperlipidemia   . Hypertension    Past Surgical History:  Procedure Laterality Date  . ABDOMINAL HYSTERECTOMY    . bladder tack    . CHOLECYSTECTOMY    . COLONOSCOPY N/A 05/18/2013   Procedure: COLONOSCOPY;  Surgeon: Wonda Horner, MD;  Location: [facility];  Service: Endoscopy;  Laterality: N/A;   Current Outpatient Prescriptions on File Prior to Visit  Medication Sig Dispense Refill  . aspirin 81 MG chewable tablet Chew 81 mg by mouth.    . beta carotene w/minerals (OCUVITE) tablet Take 1 tablet by mouth daily.    . Calcium Carb-Cholecalciferol (CALCIUM 1000 + D PO) Take by mouth daily.    . Cholecalciferol (VITAMIN D) 2000 UNITS CAPS Take 2,000 Units by mouth daily.    . cyclobenzaprine (FLEXERIL) 10 MG tablet Take 1 tablet (10 mg total) by mouth 3 (three) times daily as needed for muscle spasms. 60 tablet 0  . escitalopram (LEXAPRO) 10 MG tablet Take 1 tablet (10 mg total) by mouth daily. 30 tablet 5  . ESTRADIOL [facility] 0.02 % vaginally 2 (two) times a week. Compounded prefill    . fish oil-omega-3 fatty acids 1000 MG capsule Take 1,200 mg by mouth daily.    Marland Kitchen glucosamine-chondroitin 500-400 MG tablet Take 1 tablet by mouth daily.     .  hydrOXYzine (ATARAX/VISTARIL) 10 MG tablet TAKE 1 TABLET BY MOUTH EVERY DAY 90 tablet 2  . JANUVIA 100 MG tablet TAKE 1 TABLET DAILY 30 tablet 0  . losartan (COZAAR) 100 MG tablet Take 1 tablet (100 mg total) by mouth daily. 30 tablet 5  . mometasone (ELOCON) 0.1 % ointment Apply topically daily. 45 g 0  . Multiple Vitamin (MULTIVITAMIN) tablet Take 1 tablet by mouth daily.    Marland Kitchen NIACIN PO Take 400 mg by mouth daily.    Marland Kitchen omeprazole (PRILOSEC) 40 MG capsule TAKE ONE CAPSULE BY MOUTH EVERY MORNING 90 capsule 3  . ONE TOUCH ULTRA TEST test strip CHECK BLOOD SUGAR EVERYDAY IN THE MORNING. 100 each 11  . pravastatin (PRAVACHOL) 40 MG tablet TAKE 1 TABLET BY MOUTH AT BEDTIME 90 tablet 3   No current facility-administered medications on file prior to visit.    Allergies  Allergen Reactions  . Bactrim [Sulfamethoxazole-Trimethoprim]   . Flagyl [Metronidazole] Hives  . Lisinopril     hyperkalemia   Social History   Social History  . Marital status: Married    Spouse name: N/A  . Number of children: N/A  . Years of education: N/A   Occupational History  . Not on file.   Social History Main Topics  . Smoking status: Never Smoker  . Smokeless tobacco: Never Used  . Alcohol use No  .  Drug use: No  . Sexual activity: Yes   Other Topics Concern  . Not on file   Social History Narrative  . No narrative on file      Review of Systems  All other systems reviewed and are negative.      Objective:   Physical Exam  Cardiovascular: Normal rate, regular rhythm and normal heart sounds.   No murmur heard. Pulmonary/Chest: Effort normal and breath sounds normal.  Skin: Rash noted. There is erythema.  Vitals reviewed.         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 The superior portion of the rash was anesthetized 0.1% lidocaine with epinephrine. A 4 mm punch biopsy was performed. Hemostasis was achieved with  Drysol. The lesion was sent to pathology in a labeled container.

## 2016-06-14 ENCOUNTER — Other Ambulatory Visit: Payer: Self-pay | Admitting: Family Medicine

## 2016-06-14 MED ORDER — ESCITALOPRAM OXALATE 10 MG PO TABS: 10.0000 mg | ORAL_TABLET | Freq: Every day | ORAL | 3 refills | Status: DC

## 2016-06-19 LAB — PATHOLOGY

## 2016-06-21 ENCOUNTER — Ambulatory Visit (INDEPENDENT_AMBULATORY_CARE_PROVIDER_SITE_OTHER): Payer: PPO | Admitting: Family Medicine

## 2016-06-21 ENCOUNTER — Encounter: Payer: Self-pay | Admitting: Family Medicine

## 2016-06-21 ENCOUNTER — Other Ambulatory Visit: Payer: Self-pay | Admitting: Family Medicine

## 2016-06-21 VITALS — BP 128/74 | HR 76 | Temp 99.1°F | Resp 16 | Ht 61.5 in | Wt 184.0 lb

## 2016-06-21 DIAGNOSIS — R413 Other amnesia: Secondary | ICD-10-CM

## 2016-06-21 MED ORDER — DONEPEZIL HCL 5 MG PO TABS: 5.0000 mg | ORAL_TABLET | Freq: Every day | ORAL | 5 refills | Status: DC

## 2016-06-22 ENCOUNTER — Encounter: Payer: Self-pay | Admitting: Family Medicine

## 2016-06-22 LAB — COMPLETE METABOLIC PANEL WITH GFR
ALBUMIN: 4.1 g/dL (ref 3.6–5.1)
ALK PHOS: 59 U/L (ref 33–130)
ALT: 13 U/L (ref 6–29)
AST: 18 U/L (ref 10–35)
BILIRUBIN TOTAL: 0.8 mg/dL (ref 0.2–1.2)
BUN: 26 mg/dL — ABNORMAL HIGH (ref 7–25)
CO2: 22 mmol/L (ref 20–31)
Calcium: 9.2 mg/dL (ref 8.6–10.4)
Chloride: 103 mmol/L (ref 98–110)
Creat: 1.21 mg/dL — ABNORMAL HIGH (ref 0.60–0.93)
GFR, EST AFRICAN AMERICAN: 51 mL/min — AB (ref >=60)
GFR, EST NON AFRICAN AMERICAN: 44 mL/min — AB (ref >=60)
GLUCOSE: 98 mg/dL (ref 70–99)
POTASSIUM: 4 mmol/L (ref 3.5–5.3)
SODIUM: 137 mmol/L (ref 135–146)
TOTAL PROTEIN: 6.8 g/dL (ref 6.1–8.1)

## 2016-06-22 LAB — CBC WITH DIFFERENTIAL/PLATELET
BASOS ABS: 0 {cells}/uL (ref 0–200)
Basophils Relative: 0 %
EOS ABS: 154 {cells}/uL (ref 15–500)
EOS PCT: 2 %
HCT: 41.8 % (ref 35.0–45.0)
HEMOGLOBIN: 13.7 g/dL (ref 12.0–15.0)
Lymphocytes Relative: 27 %
Lymphs Abs: 2079 cells/uL (ref 850–3900)
MCH: 30.6 pg (ref 27.0–33.0)
MCHC: 32.8 g/dL (ref 32.0–36.0)
MCV: 93.3 fL (ref 80.0–100.0)
MONOS PCT: 8 %
MPV: 9 fL (ref 7.5–12.5)
Monocytes Absolute: 616 cells/uL (ref 200–950)
NEUTROS PCT: 63 %
Neutro Abs: 4851 cells/uL (ref 1500–7800)
PLATELETS: 245 10*3/uL (ref 140–400)
RBC: 4.48 MIL/uL (ref 3.80–5.10)
RDW: 13.4 % (ref 11.0–15.0)
WBC: 7.7 10*3/uL (ref 3.8–10.8)

## 2016-06-22 LAB — TSH: TSH: 1.68 mIU/L

## 2016-06-22 LAB — VITAMIN B12: Vitamin B-12: 428 pg/mL (ref 200–1100)

## 2016-06-22 NOTE — Progress Notes (Signed)
Subjective:    Patient ID: [name], female    DOB: [birth year], 76 y.o.   MRN: [id number]  HPI Patient is here today to discuss memory loss. Has been suffering from mild memory impairment over the past 2 years. However recently symptoms have worsened. She is now having a difficult time finding words. She has a hard time remembering names. She frequently forgets what she intended to do when she walks into a room, etc.  Mini-Mental status exam is performed today. Patient is able to tell me the date and location without difficulty. However it takes her for attempts to remember 3 words. After 3 minutes, she can't remember 0/3 objects.  She is unable to perform serial sevens. She, so he has to be reminded what number to subtract from the next number. She was able to answer 1 number correctly. Clock drawing, she never draws a circle.  She writes the numbers in somewhat of a circular pattern however when I asked her to draw a clock showing 9:30, she writes ":30"next to the 9 and does not draw a short hand or long hand. Past Medical History:  Diagnosis Date  . Acquired vaginal enterocele 2018   s/p sacrospinal ligament fixation and posterior colporrhaphy [name] at [facility]  . Diabetes mellitus without complication ([place])   . Diverticulosis   . Hyperlipidemia   . Hypertension    Past Surgical History:  Procedure Laterality Date  . ABDOMINAL HYSTERECTOMY    . bladder tack    . CHOLECYSTECTOMY    . COLONOSCOPY N/A 05/18/2013   Procedure: COLONOSCOPY;  Surgeon: Wonda Horner, MD;  Location: [facility];  Service: Endoscopy;  Laterality: N/A;   Current Outpatient Prescriptions on File Prior to Visit  Medication Sig Dispense Refill  . aspirin 81 MG chewable tablet Chew 81 mg by mouth.    . beta carotene w/minerals (OCUVITE) tablet Take 1 tablet by mouth daily.    . Calcium Carb-Cholecalciferol (CALCIUM 1000 + D PO) Take by mouth daily.    . Cholecalciferol (VITAMIN D) 2000 UNITS CAPS Take  2,000 Units by mouth daily.    . cyclobenzaprine (FLEXERIL) 10 MG tablet Take 1 tablet (10 mg total) by mouth 3 (three) times daily as needed for muscle spasms. 60 tablet 0  . escitalopram (LEXAPRO) 10 MG tablet Take 1 tablet (10 mg total) by mouth daily. 90 tablet 3  . ESTRADIOL [facility] Place 0.02 % vaginally 2 (two) times a week. Compounded prefill    . fish oil-omega-3 fatty acids 1000 MG capsule Take 1,200 mg by mouth daily.    Marland Kitchen glucosamine-chondroitin 500-400 MG tablet Take 1 tablet by mouth daily.     . hydrOXYzine (ATARAX/VISTARIL) 10 MG tablet TAKE 1 TABLET BY MOUTH EVERY DAY 90 tablet 2  . losartan (COZAAR) 100 MG tablet Take 1 tablet (100 mg total) by mouth daily. 30 tablet 5  . mometasone (ELOCON) 0.1 % ointment Apply topically daily. 45 g 0  . Multiple Vitamin (MULTIVITAMIN) tablet Take 1 tablet by mouth daily.    Marland Kitchen NIACIN PO Take 400 mg by mouth daily.    Marland Kitchen omeprazole (PRILOSEC) 40 MG capsule TAKE ONE CAPSULE BY MOUTH EVERY MORNING 90 capsule 3  . ONE TOUCH ULTRA TEST test strip CHECK BLOOD SUGAR EVERYDAY IN THE MORNING. 100 each 11  . pravastatin (PRAVACHOL) 40 MG tablet TAKE 1 TABLET BY MOUTH AT BEDTIME 90 tablet 3   No current facility-administered medications on file prior to visit.  Allergies  Allergen Reactions  . Bactrim [Sulfamethoxazole-Trimethoprim]   . Flagyl [Metronidazole] Hives  . Lisinopril     hyperkalemia   Social History   Social History  . Marital status: Married    Spouse name: N/A  . Number of children: N/A  . Years of education: N/A   Occupational History  . Not on file.   Social History Main Topics  . Smoking status: Never Smoker  . Smokeless tobacco: Never Used  . Alcohol use No  . Drug use: No  . Sexual activity: Yes   Other Topics Concern  . Not on file   Social History Narrative  . No narrative on file      Review of Systems  All other systems reviewed and are negative.      Objective:   Physical Exam  Constitutional: She  is oriented to person, place, and time. She appears well-developed and well-nourished.  Cardiovascular: Normal rate, regular rhythm and normal heart sounds.   Pulmonary/Chest: Effort normal and breath sounds normal. No respiratory distress. She has no wheezes. She has no rales.  Abdominal: Soft. Bowel sounds are normal.  Neurological: She is alert and oriented to person, place, and time. She has normal reflexes. She displays normal reflexes. No cranial nerve deficit. She exhibits normal muscle tone. Coordination normal.  Psychiatric: She has a normal mood and affect. Her behavior is normal. Judgment and thought content normal.  Vitals reviewed.         Assessment & Plan:  Memory loss - Plan: CBC with Differential/Platelet, COMPLETE METABOLIC PANEL WITH GFR, TSH, Vitamin B12  Patient has had mild memory loss over the last year or so. She also gets easily confused with medication. However I believe now that the memory loss represents the development of underlying dementia. Mini-Mental Status exam today scores 23 out of 30. Begin Aricept 5 mg a day and increased to 10 mg a day in one month if tolerated. Recheck in 3 months to reassess patient's status. If progressive, add Namenda. More than 25 minutes was spent in direct face-to-face evaluation and consultation with the patient and her husband.

## 2016-06-27 ENCOUNTER — Ambulatory Visit (INDEPENDENT_AMBULATORY_CARE_PROVIDER_SITE_OTHER): Payer: PPO | Admitting: Family Medicine

## 2016-06-27 ENCOUNTER — Encounter: Payer: Self-pay | Admitting: Family Medicine

## 2016-06-27 VITALS — BP 132/70 | HR 88 | Temp 98.2°F | Resp 14 | Ht 61.5 in | Wt 185.0 lb

## 2016-06-27 DIAGNOSIS — T7840XA Allergy, unspecified, initial encounter: Secondary | ICD-10-CM

## 2016-06-27 MED ORDER — PREDNISONE 10 MG PO TABS: ORAL_TABLET | ORAL | 0 refills | Status: DC

## 2016-06-27 MED ORDER — METHYLPREDNISOLONE ACETATE 80 MG/ML IJ SUSP
80.0000 mg | Freq: Once | INTRAMUSCULAR | Status: AC
  Administered 2016-06-27: 80 mg via INTRAMUSCULAR

## 2016-06-27 NOTE — Patient Instructions (Signed)
Take the prednisone as prescribed stop the aricept F/U 2 weeks with Dr. Dennard Schaumann

## 2016-06-27 NOTE — Progress Notes (Signed)
   Subjective:    Patient ID: [name], female    DOB: 1940-05-14, 76 y.o.   MRN: [id number]  Patient presents for Rash (redness and swelling to face- started Aricept on 6/1- noted redness on back x2 days)  Patient here with rash on right side of  her face it is spreading to her neck. It started a couple days after she started Aricept for her memory. She's not had any difficulty breathing no lip swelling or tongue swelling no wheezing. She's not change any other feeds her medications nothing new topically to her skin. He is not taking any medication today.   Review Of Systems:  GEN- denies fatigue, fever, weight loss,weakness, recent illness HEENT- denies eye drainage, change in vision, nasal discharge, CVS- denies chest pain, palpitations RESP- denies SOB, cough, wheeze ABD- denies N/V, change in stools, abd pain Neuro- denies headache, dizziness, syncope, seizure activity       Objective:    BP 132/70   Pulse 88   Temp 98.2 F (36.8 C) (Oral)   Resp 14   Ht 5' 1.5" (1.562 m)   Wt 185 lb (83.9 kg)   SpO2 98%   BMI 34.39 kg/m  GEN- NAD, alert and oriented x3 HEENT- PERRL, EOMI, non injected sclera, pink conjunctiva, MMM, oropharynx clear,uvula midline Neck- Supple, no LAD Skin- erythema with swelling on right side of forehead, scattered hives on chin extending to right neck, mild swelling right upper lid with erythema, CVS- RRR, no murmur RESP-CTAB EXT- No edema Pulses- Radial  2+        Assessment & Plan:      Problem List Items Addressed This Visit    None    Visit Diagnoses    Allergic reaction to drug, initial encounter    -  Primary   D/C aricept, Depo Medrol 80mg  given in office, start prednisone taper,no respiratory compromise.      Note: This dictation was prepared with Dragon dictation along with smaller phrase technology. Any transcriptional errors that result from this process are unintentional.

## 2016-06-27 NOTE — Addendum Note (Signed)
Addended by: Sheral Flow on: 06/27/2016 11:37 AM   Modules accepted: Orders

## 2016-07-02 ENCOUNTER — Ambulatory Visit (INDEPENDENT_AMBULATORY_CARE_PROVIDER_SITE_OTHER): Payer: PPO | Admitting: Family Medicine

## 2016-07-02 ENCOUNTER — Encounter: Payer: Self-pay | Admitting: Family Medicine

## 2016-07-02 VITALS — BP 130/64 | HR 78 | Temp 98.5°F | Resp 14 | Ht 61.5 in | Wt 185.0 lb

## 2016-07-02 DIAGNOSIS — R300 Dysuria: Secondary | ICD-10-CM | POA: Diagnosis not present

## 2016-07-02 DIAGNOSIS — N390 Urinary tract infection, site not specified: Secondary | ICD-10-CM | POA: Diagnosis not present

## 2016-07-02 DIAGNOSIS — R21 Rash and other nonspecific skin eruption: Secondary | ICD-10-CM

## 2016-07-02 DIAGNOSIS — R319 Hematuria, unspecified: Secondary | ICD-10-CM

## 2016-07-02 LAB — URINALYSIS, MICROSCOPIC ONLY
Casts: NONE SEEN [LPF]
Crystals: NONE SEEN [HPF]
Squamous Epithelial / HPF: NONE SEEN [HPF] (ref <=5)
YEAST: NONE SEEN [HPF]

## 2016-07-02 LAB — URINALYSIS, ROUTINE W REFLEX MICROSCOPIC
BILIRUBIN URINE: NEGATIVE
GLUCOSE, UA: NEGATIVE
Nitrite: POSITIVE — AB
Specific Gravity, Urine: 1.025 (ref 1.001–1.035)
pH: 6 (ref 5.0–8.0)

## 2016-07-02 MED ORDER — CIPROFLOXACIN HCL 250 MG PO TABS: 250.0000 mg | ORAL_TABLET | Freq: Two times a day (BID) | ORAL | 0 refills | Status: DC

## 2016-07-02 MED ORDER — CLOTRIMAZOLE-BETAMETHASONE 1-0.05 % EX CREA: 1.0000 "application " | TOPICAL_CREAM | Freq: Two times a day (BID) | CUTANEOUS | 0 refills | Status: DC

## 2016-07-02 MED ORDER — CIPROFLOXACIN HCL 500 MG PO TABS: 500.0000 mg | ORAL_TABLET | Freq: Two times a day (BID) | ORAL | 0 refills | Status: DC

## 2016-07-02 NOTE — Progress Notes (Signed)
    Subjective:    Patient ID: [name], female    DOB: 31-Jan-1940, 76 y.o.   MRN: [id number]  Patient presents for Dysuria (x2 dasy- urinary burning & frequency)   Yesterday started, burning, little drops/ lower abdomen pain, suprpubic region, ,redish color to urine.    No AZO taken  No vaginal discharge  Blood sugars have been good.   She was seen due to allergic reaction to prednisone, she accidentally took 40mg  daily for 5 days instead of the taper, only has 2 pills left       Review Of Systems:  GEN- denies fatigue, fever, weight loss,weakness, recent illness HEENT- denies eye drainage, change in vision, nasal discharge, CVS- denies chest pain, palpitations RESP- denies SOB, cough, wheeze ABD- denies N/V, change in stools, abd pain GU- +dysuria, hematuria, dribbling, incontinence MSK- denies joint pain, muscle aches, injury Neuro- denies headache, dizziness, syncope, seizure activity       Objective:    BP 130/64   Pulse 78   Temp 98.5 F (36.9 C) (Oral)   Resp 14   Ht 5' 1.5" (1.562 m)   Wt 185 lb (83.9 kg)   SpO2 99%   BMI 34.39 kg/m  GEN- NAD, alert and oriented x3 HEENT- PERRL, EOMI, non injected sclera, pink conjunctiva, MMM, oropharynx clear CVS- RRR, no murmur RESP-CTAB ABD-NABS,soft,mild TTP suprapubic region,no CVA tenderness  Skin in tact, no rash or neck, still has some redness in fold of skin right side  Pulses- Radial 2+   Sample- with hematuria, small amount given      Assessment & Plan:      Problem List Items Addressed This Visit    None    Visit Diagnoses    Urinary tract infection with hematuria, site unspecified    -  Primary   Start Cipro, BID try to simplify regimen  culture sent   Relevant Medications   clotrimazole-betamethasone (LOTRISONE) cream   Other Relevant Orders   Urinalysis, Routine w reflex microscopic (Completed)   Urine Culture   Rash and nonspecific skin eruption       now resolved on face  D/C the  prednisone,blood sugars have been stable,residual per pt in fold, but possibily more intertigo, given lotrisone      Note: This dictation was prepared with Dragon dictation along with smaller Company secretary. Any transcriptional errors that result from this process are unintentional.

## 2016-07-02 NOTE — Patient Instructions (Addendum)
Take the Cipro twice a day for 7 days  Use the cream on  Your side  STOP THE PREDNISONE F/U as previous

## 2016-07-04 LAB — URINE CULTURE

## 2016-07-06 ENCOUNTER — Encounter: Payer: Self-pay | Admitting: Family Medicine

## 2016-07-06 ENCOUNTER — Ambulatory Visit (INDEPENDENT_AMBULATORY_CARE_PROVIDER_SITE_OTHER): Payer: PPO | Admitting: Family Medicine

## 2016-07-06 VITALS — BP 136/78 | HR 80 | Temp 98.0°F | Resp 16 | Ht 61.5 in | Wt 181.0 lb

## 2016-07-06 DIAGNOSIS — R21 Rash and other nonspecific skin eruption: Secondary | ICD-10-CM

## 2016-07-06 MED ORDER — VALACYCLOVIR HCL 1 G PO TABS: 1000.0000 mg | ORAL_TABLET | Freq: Three times a day (TID) | ORAL | 0 refills | Status: DC

## 2016-07-06 MED ORDER — MOMETASONE FUROATE 0.1 % EX OINT: TOPICAL_OINTMENT | Freq: Every day | CUTANEOUS | 0 refills | Status: DC

## 2016-07-06 NOTE — Progress Notes (Signed)
Subjective:    Patient ID: [name], female    DOB: 11-Jun-1940, 76 y.o.   MRN: [id number]  HPI This is a challenge.  Patient recently saw my partner and was diagnosed with a rash similar to intertrigo and was treated with Lotrisone cream twice daily. The rash is in the skin folds exactly where the patient's waistband is located. I felt the same thing when I initially saw the rash. However it also has a very sharp dermatomal component. It radiates from the lower thoracic spine and an erythematous sharp well-demarcated linear patch around the umbilicus and then stops. What can found smear, is there is a similar rash on the left-hand side although nowhere near as prominent. Patient states that the rash itches significantly. There is no other rash anywhere else on the body. Prior to seeing the rash on the left-hand side, I felt certain that could be shingles. However Lotrisone has given the patient no relief. Past Medical History:  Diagnosis Date  . Acquired vaginal enterocele 2018   s/p sacrospinal ligament fixation and posterior colporrhaphy Dr. Zigmund Daniel at [facility]  . Dementia    mild mmse 05/2016 (could not remember 3 objects, unable to perform serial 7's)  . Diabetes mellitus without complication ([place])   . Diverticulosis   . Hyperlipidemia   . Hypertension    Past Surgical History:  Procedure Laterality Date  . ABDOMINAL HYSTERECTOMY    . bladder tack    . CHOLECYSTECTOMY    . COLONOSCOPY N/A 05/18/2013   Procedure: COLONOSCOPY;  Surgeon: Wonda Horner, MD;  Location: [facility];  Service: Endoscopy;  Laterality: N/A;   Current Outpatient Prescriptions on File Prior to Visit  Medication Sig Dispense Refill  . aspirin 81 MG chewable tablet Chew 81 mg by mouth.    . beta carotene w/minerals (OCUVITE) tablet Take 1 tablet by mouth daily.    . Calcium Carb-Cholecalciferol (CALCIUM 1000 + D PO) Take by mouth daily.    . Cholecalciferol (VITAMIN D) 2000 UNITS CAPS Take 2,000 Units by  mouth daily.    . ciprofloxacin (CIPRO) 250 MG tablet Take 1 tablet (250 mg total) by mouth 2 (two) times daily. 14 tablet 0  . clotrimazole-betamethasone (LOTRISONE) cream Apply 1 application topically 2 (two) times daily. 30 g 0  . cyclobenzaprine (FLEXERIL) 10 MG tablet Take 1 tablet (10 mg total) by mouth 3 (three) times daily as needed for muscle spasms. 60 tablet 0  . escitalopram (LEXAPRO) 10 MG tablet Take 1 tablet (10 mg total) by mouth daily. 90 tablet 3  . ESTRADIOL VA Place 0.02 % vaginally 2 (two) times a week. Compounded prefill    . fish oil-omega-3 fatty acids 1000 MG capsule Take 1,200 mg by mouth daily.    Marland Kitchen glucosamine-chondroitin 500-400 MG tablet Take 1 tablet by mouth daily.     . hydrOXYzine (ATARAX/VISTARIL) 10 MG tablet TAKE 1 TABLET BY MOUTH EVERY DAY 90 tablet 2  . JANUVIA 100 MG tablet TAKE 1 TABLET DAILY 30 tablet 0  . losartan (COZAAR) 100 MG tablet Take 1 tablet (100 mg total) by mouth daily. 30 tablet 5  . mometasone (ELOCON) 0.1 % ointment Apply topically daily. 45 g 0  . Multiple Vitamin (MULTIVITAMIN) tablet Take 1 tablet by mouth daily.    Marland Kitchen NIACIN PO Take 400 mg by mouth daily.    Marland Kitchen omeprazole (PRILOSEC) 40 MG capsule TAKE ONE CAPSULE BY MOUTH EVERY MORNING 90 capsule 3  . ONE TOUCH ULTRA TEST  test strip CHECK BLOOD SUGAR EVERYDAY IN THE MORNING. 100 each 11  . pravastatin (PRAVACHOL) 40 MG tablet TAKE 1 TABLET BY MOUTH AT BEDTIME 90 tablet 3   No current facility-administered medications on file prior to visit.    Allergies  Allergen Reactions  . Aricept [Donepezil Hcl]   . Bactrim [Sulfamethoxazole-Trimethoprim]   . Flagyl [Metronidazole] Hives  . Lisinopril     hyperkalemia   Social History   Social History  . Marital status: Married    Spouse name: N/A  . Number of children: N/A  . Years of education: N/A   Occupational History  . Not on file.   Social History Main Topics  . Smoking status: Never Smoker  . Smokeless tobacco: Never Used   . Alcohol use No  . Drug use: No  . Sexual activity: Yes   Other Topics Concern  . Not on file   Social History Narrative  . No narrative on file      Review of Systems  All other systems reviewed and are negative.      Objective:   Physical Exam  Constitutional: She appears well-developed and well-nourished.  Cardiovascular: Normal rate, regular rhythm and normal heart sounds.   Pulmonary/Chest: Effort normal and breath sounds normal.      Abdominal:    Vitals reviewed.  Rash is described in the history present illness. It is outlined on the diagram above. It is a linear grouping of erythematous patches and papules in a dermatomal fashion. However is also in an intertriginous skinfold. If fragile diagnosis includes intertrigo failing to respond to Lotrisone versus bilateral shingles which would be unlikely.       Assessment & Plan:  Rash and nonspecific skin eruption  I am not sure. Patient is under tremendous stress as her daughter is dying from malignant melanoma. I will treat the patient for possible shingles with Valtrex 1 g by mouth 3 times a day for 7 days. I will also cover for contact dermatitis secondary to latex in a waistband, etc. with Elocon ointment applied once daily at night. Reassess in one week. Even if it is not shingles, I do not believe the Valtrex will cause any harm.

## 2016-07-16 DIAGNOSIS — H353131 Nonexudative age-related macular degeneration, bilateral, early dry stage: Secondary | ICD-10-CM | POA: Diagnosis not present

## 2016-07-16 DIAGNOSIS — E119 Type 2 diabetes mellitus without complications: Secondary | ICD-10-CM | POA: Diagnosis not present

## 2016-07-16 DIAGNOSIS — H43822 Vitreomacular adhesion, left eye: Secondary | ICD-10-CM | POA: Diagnosis not present

## 2016-07-16 DIAGNOSIS — H35341 Macular cyst, hole, or pseudohole, right eye: Secondary | ICD-10-CM | POA: Diagnosis not present

## 2016-07-16 DIAGNOSIS — H04123 Dry eye syndrome of bilateral lacrimal glands: Secondary | ICD-10-CM | POA: Diagnosis not present

## 2016-07-16 DIAGNOSIS — H35373 Puckering of macula, bilateral: Secondary | ICD-10-CM | POA: Diagnosis not present

## 2016-07-17 ENCOUNTER — Other Ambulatory Visit: Payer: Self-pay | Admitting: Family Medicine

## 2016-07-19 ENCOUNTER — Other Ambulatory Visit: Payer: Self-pay | Admitting: Family Medicine

## 2016-07-19 MED ORDER — GLUCOSE BLOOD VI STRP: ORAL_STRIP | 3 refills | Status: DC

## 2016-07-23 ENCOUNTER — Other Ambulatory Visit: Payer: Self-pay | Admitting: Family Medicine

## 2016-07-24 ENCOUNTER — Other Ambulatory Visit: Payer: Self-pay | Admitting: Family Medicine

## 2016-07-24 MED ORDER — MEMANTINE HCL 10 MG PO TABS: 10.0000 mg | ORAL_TABLET | Freq: Two times a day (BID) | ORAL | 3 refills | Status: DC

## 2016-08-01 ENCOUNTER — Other Ambulatory Visit: Payer: Self-pay | Admitting: Family Medicine

## 2016-08-02 ENCOUNTER — Ambulatory Visit (INDEPENDENT_AMBULATORY_CARE_PROVIDER_SITE_OTHER): Payer: PPO | Admitting: Family Medicine

## 2016-08-02 ENCOUNTER — Encounter: Payer: Self-pay | Admitting: Family Medicine

## 2016-08-02 VITALS — BP 126/82 | HR 92 | Temp 99.3°F | Resp 18 | Ht 61.5 in | Wt 180.0 lb

## 2016-08-02 DIAGNOSIS — H9201 Otalgia, right ear: Secondary | ICD-10-CM | POA: Diagnosis not present

## 2016-08-02 MED ORDER — AMOXICILLIN 875 MG PO TABS: 875.0000 mg | ORAL_TABLET | Freq: Two times a day (BID) | ORAL | 0 refills | Status: DC

## 2016-08-02 NOTE — Progress Notes (Signed)
Subjective:    Patient ID: [name], female    DOB: 1941/01/13, 76 y.o.   MRN: [id number]  HPI Symptoms began 4-5 days ago with a cough that was nonproductive and a sore throat. She now has significant pain in the right ear, decreased hearing in the right ear and tinnitus in the right ear. On examination, the right tympanic membrane is retracted. It is dull. It is erythematous. There is a middle ear effusion. It is clearly infected. Past Medical History:  Diagnosis Date  . Acquired vaginal enterocele 2018   s/p sacrospinal ligament fixation and posterior colporrhaphy Dr. Zigmund Daniel at [facility]  . Dementia    mild mmse 05/2016 (could not remember 3 objects, unable to perform serial 7's)  . Diabetes mellitus without complication ([place])   . Diverticulosis   . Hyperlipidemia   . Hypertension    Past Surgical History:  Procedure Laterality Date  . ABDOMINAL HYSTERECTOMY    . bladder tack    . CHOLECYSTECTOMY    . COLONOSCOPY N/A 05/18/2013   Procedure: COLONOSCOPY;  Surgeon: Wonda Horner, MD;  Location: [facility];  Service: Endoscopy;  Laterality: N/A;   Current Outpatient Prescriptions on File Prior to Visit  Medication Sig Dispense Refill  . aspirin 81 MG chewable tablet Chew 81 mg by mouth.    . beta carotene w/minerals (OCUVITE) tablet Take 1 tablet by mouth daily.    . Calcium Carb-Cholecalciferol (CALCIUM 1000 + D PO) Take by mouth daily.    . Cholecalciferol (VITAMIN D) 2000 UNITS CAPS Take 2,000 Units by mouth daily.    . ciprofloxacin (CIPRO) 250 MG tablet Take 1 tablet (250 mg total) by mouth 2 (two) times daily. 14 tablet 0  . clotrimazole-betamethasone (LOTRISONE) cream Apply 1 application topically 2 (two) times daily. 30 g 0  . cyclobenzaprine (FLEXERIL) 10 MG tablet Take 1 tablet (10 mg total) by mouth 3 (three) times daily as needed for muscle spasms. 60 tablet 0  . escitalopram (LEXAPRO) 10 MG tablet Take 1 tablet (10 mg total) by mouth daily. 90 tablet 3  .  ESTRADIOL VA Place 0.02 % vaginally 2 (two) times a week. Compounded prefill    . fish oil-omega-3 fatty acids 1000 MG capsule Take 1,200 mg by mouth daily.    Marland Kitchen glucosamine-chondroitin 500-400 MG tablet Take 1 tablet by mouth daily.     Marland Kitchen glucose blood (ONE TOUCH ULTRA TEST) test strip CHECK BLOOD SUGAR EVERYDAY IN THE MORNING DX - E11.9 100 each 3  . hydrOXYzine (ATARAX/VISTARIL) 10 MG tablet TAKE 1 TABLET BY MOUTH EVERY DAY 90 tablet 2  . JANUVIA 100 MG tablet TAKE 1 TABLET BY MOUTH EVERY DAY 30 tablet 0  . losartan (COZAAR) 100 MG tablet Take 1 tablet (100 mg total) by mouth daily. 30 tablet 5  . memantine (NAMENDA) 10 MG tablet Take 1 tablet (10 mg total) by mouth 2 (two) times daily. 60 tablet 3  . mometasone (ELOCON) 0.1 % ointment Apply topically daily. 45 g 0  . mometasone (ELOCON) 0.1 % ointment Apply topically daily. 45 g 0  . Multiple Vitamin (MULTIVITAMIN) tablet Take 1 tablet by mouth daily.    Marland Kitchen NIACIN PO Take 400 mg by mouth daily.    Marland Kitchen omeprazole (PRILOSEC) 40 MG capsule TAKE ONE CAPSULE BY MOUTH EVERY MORNING 90 capsule 3  . pravastatin (PRAVACHOL) 40 MG tablet TAKE 1 TABLET BY MOUTH AT BEDTIME 90 tablet 3  . valACYclovir (VALTREX) 1000 MG tablet Take 1  tablet (1,000 mg total) by mouth 3 (three) times daily. 21 tablet 0   No current facility-administered medications on file prior to visit.    Allergies  Allergen Reactions  . Aricept [Donepezil Hcl]   . Bactrim [Sulfamethoxazole-Trimethoprim]   . Flagyl [Metronidazole] Hives  . Lisinopril     hyperkalemia   Social History   Social History  . Marital status: Married    Spouse name: N/A  . Number of children: N/A  . Years of education: N/A   Occupational History  . Not on file.   Social History Main Topics  . Smoking status: Never Smoker  . Smokeless tobacco: Never Used  . Alcohol use No  . Drug use: No  . Sexual activity: Yes   Other Topics Concern  . Not on file   Social History Narrative  . No  narrative on file      Review of Systems  All other systems reviewed and are negative.      Objective:   Physical Exam  Constitutional: She is oriented to person, place, and time. She appears well-developed and well-nourished.  HENT:  Right Ear: Tympanic membrane is erythematous and retracted. A middle ear effusion is present. Decreased hearing is noted.  Left Ear: Tympanic membrane normal.  Cardiovascular: Normal rate, regular rhythm and normal heart sounds.   Pulmonary/Chest: Effort normal and breath sounds normal. No respiratory distress. She has no wheezes. She has no rales.  Abdominal: Soft. Bowel sounds are normal.  Neurological: She is alert and oriented to person, place, and time. She has normal reflexes. No cranial nerve deficit. She exhibits normal muscle tone. Coordination normal.  Psychiatric: She has a normal mood and affect. Her behavior is normal. Judgment and thought content normal.  Vitals reviewed.         Assessment & Plan:  Otalgia of right ear - Plan: amoxicillin (AMOXIL) 875 MG tablet  I believe the patient has a viral upper respiratory infection/cold. However I believe she's developed a secondary ear infection from this. Begin amoxicillin 875 mg by mouth twice a day for 10 days and reassess next week or sooner if worse

## 2016-08-24 ENCOUNTER — Telehealth: Payer: Self-pay

## 2016-08-24 DIAGNOSIS — R300 Dysuria: Secondary | ICD-10-CM | POA: Diagnosis not present

## 2016-08-24 DIAGNOSIS — N3000 Acute cystitis without hematuria: Secondary | ICD-10-CM | POA: Diagnosis not present

## 2016-08-24 NOTE — Telephone Encounter (Signed)
Patient called and stated she felt she had cystitis she has had  burning with urination,no abdominal pain, no blood in urine. No otc  treatment has been used such as AZO which is what the patient can use to find relief  I offered to make an appointment for 8/6 patient stated she needed to be seen today I then recommended pt go to an urgent care to be evaluated and treated

## 2016-08-27 ENCOUNTER — Other Ambulatory Visit: Payer: Self-pay | Admitting: Family Medicine

## 2016-08-27 MED ORDER — ESCITALOPRAM OXALATE 10 MG PO TABS: 10.0000 mg | ORAL_TABLET | Freq: Every day | ORAL | 3 refills | Status: DC

## 2016-08-28 ENCOUNTER — Other Ambulatory Visit: Payer: Self-pay | Admitting: Family Medicine

## 2016-08-28 MED ORDER — ESCITALOPRAM OXALATE 10 MG PO TABS: 10.0000 mg | ORAL_TABLET | Freq: Every day | ORAL | 3 refills | Status: DC

## 2016-09-19 ENCOUNTER — Other Ambulatory Visit: Payer: Self-pay | Admitting: Family Medicine

## 2016-09-28 ENCOUNTER — Ambulatory Visit (INDEPENDENT_AMBULATORY_CARE_PROVIDER_SITE_OTHER): Payer: PPO | Admitting: Family Medicine

## 2016-09-28 ENCOUNTER — Encounter: Payer: Self-pay | Admitting: Family Medicine

## 2016-09-28 VITALS — BP 127/88 | HR 84 | Temp 98.2°F | Resp 16 | Ht 61.5 in | Wt 177.0 lb

## 2016-09-28 DIAGNOSIS — G309 Alzheimer's disease, unspecified: Secondary | ICD-10-CM

## 2016-09-28 DIAGNOSIS — F0280 Dementia in other diseases classified elsewhere without behavioral disturbance: Secondary | ICD-10-CM

## 2016-09-28 DIAGNOSIS — L304 Erythema intertrigo: Secondary | ICD-10-CM

## 2016-09-28 MED ORDER — CLOTRIMAZOLE-BETAMETHASONE 1-0.05 % EX CREA: 1.0000 "application " | TOPICAL_CREAM | Freq: Two times a day (BID) | CUTANEOUS | 3 refills | Status: DC

## 2016-09-28 NOTE — Progress Notes (Signed)
Subjective:    Patient ID: [name], female    DOB: Jul 30, 1940, 76 y.o.   MRN: [id number]  HPI  06/21/16 Patient is here today to discuss memory loss. Has been suffering from mild memory impairment over the past 2 years. However recently symptoms have worsened. She is now having a difficult time finding words. She has a hard time remembering names. She frequently forgets what she intended to do when she walks into a room, etc.  Mini-Mental status exam is performed today. Patient is able to tell me the date and location without difficulty. However it takes her for attempts to remember 3 words. After 3 minutes, she can't remember 0/3 objects.  She is unable to perform serial sevens. She, so he has to be reminded what number to subtract from the next number. She was able to answer 1 number correctly. Clock drawing, she never draws a circle.  She writes the numbers in somewhat of a circular pattern however when I asked her to draw a clock showing 9:30, she writes ":30"next to the 9 and does not draw a short hand or long hand.  At that time, my plan was:  Patient has had mild memory loss over the last year or so. She also gets easily confused with medication. However I believe now that the memory loss represents the development of underlying dementia. Mini-Mental Status exam today scores 23 out of 30. Begin Aricept 5 mg a day and increased to 10 mg a day in one month if tolerated. Recheck in 3 months to reassess patient's status. If progressive, add Namenda. More than 25 minutes was spent in direct face-to-face evaluation and consultation with the patient and her husband.  09/28/16 Patient had a severe reaction to Aricept and it was discontinued. We have started her on Namenda the patient continues to show cognitive decline. Husband is requesting a neurology consultation for second opinion. She also has a rash under both breasts, between both breasts, and underneath her abdominal pannus consistent with  Candida intertrigo Past Medical History:  Diagnosis Date  . Acquired vaginal enterocele 2018   s/p sacrospinal ligament fixation and posterior colporrhaphy Dr. Zigmund Daniel at [facility]  . Dementia    mild mmse 05/2016 (could not remember 3 objects, unable to perform serial 7's)  . Diabetes mellitus without complication ([place])   . Diverticulosis   . Hyperlipidemia   . Hypertension    Past Surgical History:  Procedure Laterality Date  . ABDOMINAL HYSTERECTOMY    . bladder tack    . CHOLECYSTECTOMY    . COLONOSCOPY N/A 05/18/2013   Procedure: COLONOSCOPY;  Surgeon: Wonda Horner, MD;  Location: [facility];  Service: Endoscopy;  Laterality: N/A;   Current Outpatient Prescriptions on File Prior to Visit  Medication Sig Dispense Refill  . aspirin 81 MG chewable tablet Chew 81 mg by mouth.    . beta carotene w/minerals (OCUVITE) tablet Take 1 tablet by mouth daily.    . Calcium Carb-Cholecalciferol (CALCIUM 1000 + D PO) Take by mouth daily.    . Cholecalciferol (VITAMIN D) 2000 UNITS CAPS Take 2,000 Units by mouth daily.    Marland Kitchen escitalopram (LEXAPRO) 10 MG tablet Take 1 tablet (10 mg total) by mouth daily. 90 tablet 3  . fish oil-omega-3 fatty acids 1000 MG capsule Take 1,200 mg by mouth daily.    Marland Kitchen glucosamine-chondroitin 500-400 MG tablet Take 1 tablet by mouth daily.     Marland Kitchen glucose blood (ONE TOUCH ULTRA TEST) test strip  CHECK BLOOD SUGAR EVERYDAY IN THE MORNING DX - E11.9 100 each 3  . hydrOXYzine (ATARAX/VISTARIL) 10 MG tablet TAKE 1 TABLET BY MOUTH EVERY DAY 90 tablet 2  . JANUVIA 100 MG tablet TAKE 1 TABLET BY MOUTH EVERY DAY 30 tablet 0  . losartan (COZAAR) 100 MG tablet Take 1 tablet (100 mg total) by mouth daily. 30 tablet 5  . memantine (NAMENDA) 10 MG tablet Take 1 tablet (10 mg total) by mouth 2 (two) times daily. 60 tablet 3  . Multiple Vitamin (MULTIVITAMIN) tablet Take 1 tablet by mouth daily.    Marland Kitchen NIACIN PO Take 400 mg by mouth daily.    Marland Kitchen omeprazole (PRILOSEC) 40 MG capsule TAKE  ONE CAPSULE BY MOUTH EVERY MORNING 90 capsule 3  . pravastatin (PRAVACHOL) 40 MG tablet TAKE 1 TABLET BY MOUTH AT BEDTIME 90 tablet 3   No current facility-administered medications on file prior to visit.    Allergies  Allergen Reactions  . Aricept [Donepezil Hcl]   . Bactrim [Sulfamethoxazole-Trimethoprim]   . Flagyl [Metronidazole] Hives  . Lisinopril     hyperkalemia   Social History   Social History  . Marital status: Married    Spouse name: N/A  . Number of children: N/A  . Years of education: N/A   Occupational History  . Not on file.   Social History Main Topics  . Smoking status: Never Smoker  . Smokeless tobacco: Never Used  . Alcohol use No  . Drug use: No  . Sexual activity: Yes   Other Topics Concern  . Not on file   Social History Narrative  . No narrative on file      Review of Systems  Skin: Positive for rash.  All other systems reviewed and are negative.      Objective:   Physical Exam  Constitutional: She is oriented to person, place, and time. She appears well-developed and well-nourished.  Cardiovascular: Normal rate, regular rhythm and normal heart sounds.   Pulmonary/Chest: Effort normal and breath sounds normal. No respiratory distress. She has no wheezes. She has no rales.  Abdominal: Soft. Bowel sounds are normal.  Neurological: She is alert and oriented to person, place, and time. She has normal reflexes. No cranial nerve deficit. She exhibits normal muscle tone. Coordination normal.  Psychiatric: She has a normal mood and affect. Her behavior is normal. Judgment and thought content normal.  Vitals reviewed.         Assessment & Plan:  Intertrigo - Plan: clotrimazole-betamethasone (LOTRISONE) cream  Treatment intertrigo with Lotrisone cream applied twice daily for 2 weeks. Keep the skin dry and clean. I will gladly consult neurology regarding her memory loss/dementia for a second opinion.

## 2016-10-03 ENCOUNTER — Encounter: Payer: Self-pay | Admitting: Neurology

## 2016-10-09 ENCOUNTER — Ambulatory Visit (INDEPENDENT_AMBULATORY_CARE_PROVIDER_SITE_OTHER): Payer: PPO | Admitting: Neurology

## 2016-10-09 VITALS — BP 132/76 | HR 79 | Ht 62.0 in | Wt 177.0 lb

## 2016-10-09 DIAGNOSIS — G309 Alzheimer's disease, unspecified: Secondary | ICD-10-CM | POA: Diagnosis not present

## 2016-10-09 DIAGNOSIS — F03A0 Unspecified dementia, mild, without behavioral disturbance, psychotic disturbance, mood disturbance, and anxiety: Secondary | ICD-10-CM

## 2016-10-09 DIAGNOSIS — F4321 Adjustment disorder with depressed mood: Secondary | ICD-10-CM

## 2016-10-09 DIAGNOSIS — F0390 Unspecified dementia without behavioral disturbance: Secondary | ICD-10-CM

## 2016-10-09 NOTE — Patient Instructions (Addendum)
1. Schedule MRI brain without contrast  We have sent a referral to [facility] for your MRI and they will call you directly to schedule your appt. They are located at [place]. If you need to contact them directly please call [phone].  2. Refer to Behavioral Medicine for therapy  We have sent a referral to [facility].  Please call [phone] to schedule your first appointment.   3. Continue Memantine 10mg  twice a day 4. Control of blood pressure, cholesterol, as well as physical exercise and brain stimulation exercises are important for brain health 5. Follow-up in 6 months, call for any changes

## 2016-10-09 NOTE — Progress Notes (Signed)
NEUROLOGY CONSULTATION NOTE  SONNI BARSE MRN: [id number] DOB: 09-Jan-1941  Referring provider: Dr. Jenna Luo Primary care provider: Dr. Jenna Luo  Reason for consult:  Second opinion dementia  Dear Dr Dennard Schaumann:  Thank you for your kind referral of AMERAH PULEO for consultation of the above symptoms. Although her history is well known to you, please allow me to reiterate it for the purpose of our medical record. The patient was accompanied to the clinic by her husband who also provides collateral information. Records and images were personally reviewed where available.  HISTORY OF PRESENT ILLNESS: This is a 76 year old right-handed woman with a history of hypertension, hyperlipidemia, diabetes, presenting for evaluation of worsening memory. She feels her memory is pretty good. She denies getting lost driving. She denies misplacing things, which her husband disagrees with and reminds her about losing her glasses frequently. She denies missing medications. Her husband checks after her and agrees. Her husband is in charge of finances. He started noticing memory changes around 1.5-2 years ago, initially forgetting names, but more recently short-term memory has worsened where she cannot recall what she had for lunch. Long-term memory is good. He reports that when she is not feeling stressed or has to give the answer right away, she does pretty good. Otherwise she would lock up. She takes care of herself well, no difficulties with dressing and bathing. No personality changes or hallucinations. Her husband reports that their daughter has been in and out of the hospital for the past 1.5-2 years, causing a lot of stress on her. She feels her mood is "okay, I guess." She was initially started on Aricept which caused a rash. She is now on Namenda 10mg  BID without side effects, her husband reports they have not noticed any difference on the medication.  She denies any headaches, dizziness,  diplopia, dysarthria/dysphagia, neck/back pain, bowel dysfunction, anosmia, or tremors. She has occasional urinary incontinence. No falls. Her mother had memory problems. She denies any head injuries, she drinks alcohol on occasion.    Laboratory Data: Lab Results  Component Value Date   TSH 1.68 06/21/2016   Lab Results  Component Value Date   [id number] 643 06/21/2016     PAST MEDICAL HISTORY: Past Medical History:  Diagnosis Date  . Acquired vaginal enterocele 2018   s/p sacrospinal ligament fixation and posterior colporrhaphy Dr. Zigmund Daniel at [facility]  . Dementia    mild mmse 05/2016 (could not remember 3 objects, unable to perform serial 7's)  . Diabetes mellitus without complication ([place])   . Diverticulosis   . Hyperlipidemia   . Hypertension     PAST SURGICAL HISTORY: Past Surgical History:  Procedure Laterality Date  . ABDOMINAL HYSTERECTOMY    . bladder tack    . CHOLECYSTECTOMY    . COLONOSCOPY N/A 05/18/2013   Procedure: COLONOSCOPY;  Surgeon: Wonda Horner, MD;  Location: [facility];  Service: Endoscopy;  Laterality: N/A;    MEDICATIONS: Current Outpatient Prescriptions on File Prior to Visit  Medication Sig Dispense Refill  . aspirin 81 MG chewable tablet Chew 81 mg by mouth.    . beta carotene w/minerals (OCUVITE) tablet Take 1 tablet by mouth daily.    . Calcium Carb-Cholecalciferol (CALCIUM 1000 + D PO) Take by mouth daily.    . Cholecalciferol (VITAMIN D) 2000 UNITS CAPS Take 2,000 Units by mouth daily.    . clotrimazole-betamethasone (LOTRISONE) cream Apply 1 application topically 2 (two) times daily. 30 g 3  . escitalopram (  LEXAPRO) 10 MG tablet Take 1 tablet (10 mg total) by mouth daily. 90 tablet 3  . fish oil-omega-3 fatty acids 1000 MG capsule Take 1,200 mg by mouth daily.    Marland Kitchen glucosamine-chondroitin 500-400 MG tablet Take 1 tablet by mouth daily.     Marland Kitchen glucose blood (ONE TOUCH ULTRA TEST) test strip CHECK BLOOD SUGAR EVERYDAY IN THE MORNING DX -  E11.9 100 each 3  . hydrOXYzine (ATARAX/VISTARIL) 10 MG tablet TAKE 1 TABLET BY MOUTH EVERY DAY 90 tablet 2  . JANUVIA 100 MG tablet TAKE 1 TABLET BY MOUTH EVERY DAY 30 tablet 0  . losartan (COZAAR) 100 MG tablet Take 1 tablet (100 mg total) by mouth daily. 30 tablet 5  . memantine (NAMENDA) 10 MG tablet Take 1 tablet (10 mg total) by mouth 2 (two) times daily. 60 tablet 3  . Multiple Vitamin (MULTIVITAMIN) tablet Take 1 tablet by mouth daily.    Marland Kitchen NIACIN PO Take 400 mg by mouth daily.    Marland Kitchen omeprazole (PRILOSEC) 40 MG capsule TAKE ONE CAPSULE BY MOUTH EVERY MORNING 90 capsule 3  . pravastatin (PRAVACHOL) 40 MG tablet TAKE 1 TABLET BY MOUTH AT BEDTIME 90 tablet 3   No current facility-administered medications on file prior to visit.     ALLERGIES: Allergies  Allergen Reactions  . Aricept [Donepezil Hcl]   . Bactrim [Sulfamethoxazole-Trimethoprim]   . Flagyl [Metronidazole] Hives  . Lisinopril     hyperkalemia    FAMILY HISTORY: Family History  Problem Relation Age of Onset  . CAD Mother   . Heart attack Father     SOCIAL HISTORY: Social History   Social History  . Marital status: Married    Spouse name: N/A  . Number of children: N/A  . Years of education: N/A   Occupational History  . Not on file.   Social History Main Topics  . Smoking status: Never Smoker  . Smokeless tobacco: Never Used  . Alcohol use No  . Drug use: No  . Sexual activity: Yes   Other Topics Concern  . Not on file   Social History Narrative  . No narrative on file    REVIEW OF SYSTEMS: Constitutional: No fevers, chills, or sweats, no generalized fatigue, change in appetite Eyes: No visual changes, double vision, eye pain Ear, nose and throat: No hearing loss, ear pain, nasal congestion, sore throat Cardiovascular: No chest pain, palpitations Respiratory:  No shortness of breath at rest or with exertion, wheezes GastrointestinaI: No nausea, vomiting, diarrhea, abdominal pain, fecal  incontinence Genitourinary:  No dysuria, urinary retention or frequency Musculoskeletal:  No neck pain, back pain Integumentary: No rash, pruritus, skin lesions Neurological: as above Psychiatric: No depression, insomnia, anxiety Endocrine: No palpitations, fatigue, diaphoresis, mood swings, change in appetite, change in weight, increased thirst Hematologic/Lymphatic:  No anemia, purpura, petechiae. Allergic/Immunologic: no itchy/runny eyes, nasal congestion, recent allergic reactions, rashes  PHYSICAL EXAM: Vitals:   10/09/16 1025  BP: 132/76  Pulse: 79  SpO2: 95%   General: No acute distress Head:  Normocephalic/atraumatic Eyes: Fundoscopic exam shows bilateral sharp discs, no vessel changes, exudates, or hemorrhages Neck: supple, no paraspinal tenderness, full range of motion Back: No paraspinal tenderness Heart: regular rate and rhythm Lungs: Clear to auscultation bilaterally. Vascular: No carotid bruits. Skin/Extremities: No rash, no edema Neurological Exam: Mental status: alert and oriented to person, place, and month/year, no dysarthria or aphasia, Fund of knowledge is appropriate.  Remote memory intact.  Attention and concentration are normal.  Able to name objects and repeat phrases.  Montreal Cognitive Assessment  10/09/2016  Visuospatial/ Executive (0/5) 3  Naming (0/3) 2  Attention: Read list of digits (0/2) 2  Attention: Read list of letters (0/1) 1  Attention: Serial 7 subtraction starting at 100 (0/3) 1  Language: Repeat phrase (0/2) 1  Language : Fluency (0/1) 0  Abstraction (0/2) 2  Delayed Recall (0/5) 0  Orientation (0/6) 4  Total 16   Cranial nerves: CN I: not tested CN II: pupils equal, round and reactive to light, visual fields intact, fundi unremarkable. CN III, IV, VI:  full range of motion, no nystagmus, no ptosis CN V: facial sensation intact CN VII: upper and lower face symmetric CN VIII: hearing intact to finger rub CN IX, X: gag intact,  uvula midline CN XI: sternocleidomastoid and trapezius muscles intact CN XII: tongue midline Bulk & Tone: normal, no fasciculations. Motor: 5/5 throughout with no pronator drift. Sensation: intact to light touch, cold, pin, vibration and joint position sense.  No extinction to double simultaneous stimulation.  Romberg test negative Deep Tendon Reflexes: +2 throughout, no ankle clonus Plantar responses: downgoing bilaterally Cerebellar: no incoordination on finger to nose, heel to shin. No dysdiadochokinesia Gait: narrow-based and steady, difficulty with tandem walk  Tremor: none  IMPRESSION: This is a 76 year old right-handed woman with vascular risk factors including hypertension, hyperlipidemia, diabetes, presenting for second opinion on worsening memory. Her neurological exam is non-focal, MOCA score 16/39, indicating mild dementia. MRI brain without contrast will be ordered to assess for underlying structural abnormality. We discussed different causes of memory loss, including mood. It appears memory changes started around the time of increased stress with her daughter's medical condition. She agrees to see a therapist. Continue Namenda 10mg  BID, we discussed expectations from the medication, as well as natural progression and prognosis of dementia. Continue to monitor driving. We discussed the importance of control of vascular risk factors, physical exercise, and brain stimulation exercises for brain health. She will follow-up in 6 months and knows to call for any changes.   Thank you for allowing me to participate in the care of this patient. Please do not hesitate to call for any questions or concerns.   Ellouise Newer, M.D.  CC: Dr. Dennard Schaumann

## 2016-10-11 ENCOUNTER — Encounter: Payer: Self-pay | Admitting: Neurology

## 2016-10-11 DIAGNOSIS — F0390 Unspecified dementia without behavioral disturbance: Secondary | ICD-10-CM | POA: Insufficient documentation

## 2016-10-11 DIAGNOSIS — F4321 Adjustment disorder with depressed mood: Secondary | ICD-10-CM | POA: Insufficient documentation

## 2016-10-30 ENCOUNTER — Inpatient Hospital Stay: Admission: RE | Admit: 2016-10-30 | Payer: PPO | Source: Ambulatory Visit

## 2016-11-06 ENCOUNTER — Ambulatory Visit (INDEPENDENT_AMBULATORY_CARE_PROVIDER_SITE_OTHER): Payer: PPO | Admitting: Family Medicine

## 2016-11-06 DIAGNOSIS — Z23 Encounter for immunization: Secondary | ICD-10-CM | POA: Diagnosis not present

## 2016-11-12 ENCOUNTER — Other Ambulatory Visit: Payer: Self-pay | Admitting: Family Medicine

## 2016-11-16 ENCOUNTER — Other Ambulatory Visit: Payer: PPO

## 2016-11-16 ENCOUNTER — Ambulatory Visit (HOSPITAL_COMMUNITY): Payer: PPO | Admitting: Psychiatry

## 2016-11-25 ENCOUNTER — Other Ambulatory Visit: Payer: Self-pay | Admitting: Family Medicine

## 2016-12-03 ENCOUNTER — Ambulatory Visit
Admission: RE | Admit: 2016-12-03 | Discharge: 2016-12-03 | Disposition: A | Discharge status: Home / Self Care | Payer: PPO | Source: Ambulatory Visit | Attending: Neurology | Admitting: Neurology

## 2016-12-03 DIAGNOSIS — F03A0 Unspecified dementia, mild, without behavioral disturbance, psychotic disturbance, mood disturbance, and anxiety: Secondary | ICD-10-CM

## 2016-12-03 DIAGNOSIS — R413 Other amnesia: Secondary | ICD-10-CM | POA: Diagnosis not present

## 2016-12-03 DIAGNOSIS — F0390 Unspecified dementia without behavioral disturbance: Secondary | ICD-10-CM

## 2016-12-03 DIAGNOSIS — F4321 Adjustment disorder with depressed mood: Secondary | ICD-10-CM

## 2016-12-03 DIAGNOSIS — G309 Alzheimer's disease, unspecified: Secondary | ICD-10-CM

## 2016-12-04 ENCOUNTER — Telehealth: Payer: Self-pay

## 2016-12-04 NOTE — Telephone Encounter (Signed)
LMOM relaying message below.  

## 2016-12-04 NOTE — Telephone Encounter (Signed)
-----   Message from Alda Berthold, DO sent at 12/03/2016 12:06 PM EST ----- Please inform patient that there is age-related volume loss on imaging.  No signs of stroke, tumor, or bleed.

## 2016-12-18 ENCOUNTER — Ambulatory Visit (INDEPENDENT_AMBULATORY_CARE_PROVIDER_SITE_OTHER): Payer: PPO | Admitting: Psychiatry

## 2016-12-18 ENCOUNTER — Encounter (HOSPITAL_COMMUNITY): Payer: Self-pay | Admitting: Psychiatry

## 2016-12-18 VITALS — BP 122/70 | HR 78 | Ht 62.0 in | Wt 178.0 lb

## 2016-12-18 DIAGNOSIS — Z818 Family history of other mental and behavioral disorders: Secondary | ICD-10-CM

## 2016-12-18 DIAGNOSIS — F419 Anxiety disorder, unspecified: Secondary | ICD-10-CM

## 2016-12-18 DIAGNOSIS — F0390 Unspecified dementia without behavioral disturbance: Secondary | ICD-10-CM

## 2016-12-18 DIAGNOSIS — F330 Major depressive disorder, recurrent, mild: Secondary | ICD-10-CM

## 2016-12-18 DIAGNOSIS — Z79899 Other long term (current) drug therapy: Secondary | ICD-10-CM

## 2016-12-18 NOTE — Progress Notes (Signed)
Psychiatric Initial Adult Assessment   Patient Identification: [name] MRN:  [id number] Date of Evaluation:  12/18/2016 Referral Source: Primary care physician and neurologist Chief Complaint:  I do not know why I am here.  Visit Diagnosis:    ICD-10-CM   1. MDD (major depressive disorder), recurrent episode, mild (HCC) F33.0     History of Present Illness: Patient is 76 year old Caucasian, retired very pleasant female who came for her initial evaluation.  Patient is referred from primary care physician and neurologist.  Patient accompanied today with her husband who is concerned about patient memory and having depression.  Most of the information was obtained from the husband and electronic medical record.  Patient reported that she is not sure why she is seeing psychiatrist but she agreed because her husband felt she needed to be seen.  Patient denies any anhedonia, hopelessness, suicidal thoughts, mood swings, paranoia or any hallucination.  However she admitted sometimes get frustrated, irritable, confused and worried about her daughter.  Her husband mentioned that patient is been forgetful for the past 2 years.  He also noticed patient has lack of motivation, sometimes crying, irritable, very sensitive and lately self-neglect in diet.  Husband believe that patient is stressed due to her 75 year old daughter who is suffering from melanoma with metastasis and she has been in and out from the hospital many times in the past 2 years.  Husband also reported that lately she has been not social and active in daily activities.  Her primary care physician is started her on Lexapro years ago and has been endorsed much improvement in her depression and irritability.  Patient only concern is memory as she is sometimes forgets.  Patient is able to take care of herself and she has no difficulty in her ADLs.  She is able to drive but usually she goes with the husband.  Her husband does grocery shopping.   Patient denies any aggression, self abusive behavior, nightmares, flashbacks, anxiety attack, panic attacks, OCD symptoms.  Patient also denies any headaches, dizziness, seizures, back pain.  She is tolerating Lexapro 10 mg daily without any side effects.  She is not seeing any therapist.  Patient denies drinking alcohol or using any illegal substances.  Patient lives with her husband who is very supportive.  Together they have been married for 46 years.  Patient has 2 sons who lives in [place].  Patient visit her daughter and son very frequently.   Associated Signs/Symptoms: Depression Symptoms:  difficulty concentrating, impaired memory, anxiety, loss of energy/fatigue, (Hypo) Manic Symptoms:  No manic symptoms Anxiety Symptoms:  Excessive Worry, Psychotic Symptoms:  No psychotic symptoms PTSD Symptoms: Negative  Past Psychiatric History: Patient denies any history of psychiatric inpatient treatment, paranoia, hallucination, suicidal attempt.  She is prescribed Lexapro from her primary care physician.  Previous Psychotropic Medications: Yes   Substance Abuse History in the last 12 months:  No.  Consequences of Substance Abuse: Negative  Past Medical History:  Past Medical History:  Diagnosis Date  . Acquired vaginal enterocele 2018   s/p sacrospinal ligament fixation and posterior colporrhaphy Dr. Zigmund Daniel at [facility]  . Dementia    mild mmse 05/2016 (could not remember 3 objects, unable to perform serial 7's)  . Diabetes mellitus without complication ([place])   . Diverticulosis   . Hyperlipidemia   . Hypertension     Past Surgical History:  Procedure Laterality Date  . ABDOMINAL HYSTERECTOMY    . bladder tack    . CHOLECYSTECTOMY    .  COLONOSCOPY N/A 05/18/2013   Procedure: COLONOSCOPY;  Surgeon: Wonda Horner, MD;  Location: [facility];  Service: Endoscopy;  Laterality: N/A;    Family Psychiatric History: Mother has depression  Family History:  Family History   Problem Relation Age of Onset  . CAD Mother   . Heart attack Father     Social History:   Social History   Socioeconomic History  . Marital status: Married    Spouse name: None  . Number of children: None  . Years of education: None  . Highest education level: None  Social Needs  . Financial resource strain: None  . Food insecurity - worry: None  . Food insecurity - inability: None  . Transportation needs - medical: None  . Transportation needs - non-medical: None  Occupational History  . None  Tobacco Use  . Smoking status: Never Smoker  . Smokeless tobacco: Never Used  Substance and Sexual Activity  . Alcohol use: Yes    Alcohol/week: 1.2 oz    Types: 2 Shots of liquor per week    Comment: 2 drinks of gin a day  . Drug use: No  . Sexual activity: Yes  Other Topics Concern  . None  Social History Narrative  . None    Additional Social History: Patient born and raised in [place].  She had worked as a Network engineer in the past.  She lives with her husband who is very supportive and cooperative.  Together they have 3 children.  Her 69 year old daughter has metastatic melanoma and she has been in and out from the hospital in past 2 years.  Allergies:   Allergies  Allergen Reactions  . Aricept [Donepezil Hcl]   . Bactrim [Sulfamethoxazole-Trimethoprim]   . Flagyl [Metronidazole] Hives  . Lisinopril     hyperkalemia    Metabolic Disorder Labs: Lab Results  Component Value Date   HGBA1C 5.4 02/20/2016   MPG 108 02/20/2016   MPG 117 (H) 11/30/2013   No results found for: PROLACTIN Lab Results  Component Value Date   CHOL 175 11/30/2013   TRIG 155 (H) 11/30/2013   HDL 64 11/30/2013   CHOLHDL 2.7 11/30/2013   VLDL 31 11/30/2013   LDLCALC 80 11/30/2013   LDLCALC 67 07/29/2013     Current Medications: Current Outpatient Medications  Medication Sig Dispense Refill  . aspirin 81 MG chewable tablet Chew 81 mg by mouth.    . beta carotene w/minerals  (OCUVITE) tablet Take 1 tablet by mouth daily.    . Calcium Carb-Cholecalciferol (CALCIUM 1000 + D PO) Take by mouth daily.    . Cholecalciferol (VITAMIN D) 2000 UNITS CAPS Take 2,000 Units by mouth daily.    . clotrimazole-betamethasone (LOTRISONE) cream Apply 1 application topically 2 (two) times daily. 30 g 3  . escitalopram (LEXAPRO) 10 MG tablet Take 1 tablet (10 mg total) by mouth daily. 90 tablet 3  . fish oil-omega-3 fatty acids 1000 MG capsule Take 1,200 mg by mouth daily.    Marland Kitchen glucosamine-chondroitin 500-400 MG tablet Take 1 tablet by mouth daily.     Marland Kitchen glucose blood (ONE TOUCH ULTRA TEST) test strip CHECK BLOOD SUGAR EVERYDAY IN THE MORNING DX - E11.9 100 each 3  . hydrOXYzine (ATARAX/VISTARIL) 10 MG tablet TAKE 1 TABLET BY MOUTH EVERY DAY 90 tablet 2  . JANUVIA 100 MG tablet TAKE 1 TABLET BY MOUTH EVERY DAY 30 tablet 0  . losartan (COZAAR) 100 MG tablet Take 1 tablet (100 mg total) by mouth  daily. 30 tablet 5  . memantine (NAMENDA) 10 MG tablet TAKE 1 TABLET BY MOUTH TWICE A DAY 60 tablet 3  . Multiple Vitamin (MULTIVITAMIN) tablet Take 1 tablet by mouth daily.    Marland Kitchen NIACIN PO Take 400 mg by mouth daily.    Marland Kitchen omeprazole (PRILOSEC) 40 MG capsule TAKE ONE CAPSULE BY MOUTH EVERY MORNING 90 capsule 3  . pravastatin (PRAVACHOL) 40 MG tablet TAKE 1 TABLET BY MOUTH AT BEDTIME 90 tablet 3   No current facility-administered medications for this visit.     Neurologic: Headache: No Seizure: No Paresthesias:No  Musculoskeletal: Strength & Muscle Tone: within normal limits Gait & Station: normal Patient leans: N/A  Psychiatric Specialty Exam: Review of Systems  Psychiatric/Behavioral: Positive for memory loss. The patient is nervous/anxious.     Blood pressure 122/70, pulse 78, height 5\' 2"  (1.575 m), weight 178 lb (80.7 kg).Body mass index is 32.56 kg/m.  General Appearance: Well Groomed  Eye Contact:  Good  Speech:  Slow  Volume:  Normal  Mood:  Anxious  Affect:  Congruent   Thought Process:  Goal Directed  Orientation:  Full (Time, Place, and Person)  Thought Content:  Logical  Suicidal Thoughts:  No  Homicidal Thoughts:  No  Memory:  Immediate;   Fair Recent;   Fair Remote;   Fair  Judgement:  Good  Insight:  Good  Psychomotor Activity:  Decreased  Concentration:  Concentration: Fair and Attention Span: Fair  Recall:  [employer] of Knowledge:Fair  Language: Good  Akathisia:  No  Handed:  Right  AIMS (if indicated):  0  Assets:  Communication Skills Desire for Improvement Housing Resilience Social Support  ADL's:  Intact  Cognition: Impaired,  Mild  Sleep: Good   Assessment: Major depressive disorder, recurrent mild.  Anxiety disorder NOS.  Plan: I review her psychosocial stressors, current medication, recent blood work results, neuroimaging studies and collect information from other providers.  Her [place] score 16/39.  Patient has dementia and recent MRI shows atrophy.  She is taking Namenda.  She also taking Lexapro 10 mg daily.  I recommended that she can try taking Lexapro 15 milligrams to help residual depression however reminded that medication can cause side effects including tremors shakes and EPS.  I do believe patient will get more benefit if she is seeing family therapy and counseling.  Patient and her husband agree.  I have provided names of family therapist.  Her antidepressant can be adjusted at her primary care physician however if patient like to come back I am happy to see her.  Discussed side effects of the medication.  Discussed safety concerns at any time having active suicidal thoughts or homicidal thoughts continue to call 911 or go to local emergency room.  At this time no need for follow-up appointment unless patient wants to come back.  Kathlee Nations, MD 11/27/201811:34 AM

## 2016-12-19 ENCOUNTER — Telehealth: Payer: Self-pay | Admitting: Family Medicine

## 2016-12-19 DIAGNOSIS — R319 Hematuria, unspecified: Secondary | ICD-10-CM | POA: Diagnosis not present

## 2016-12-19 DIAGNOSIS — N3000 Acute cystitis without hematuria: Secondary | ICD-10-CM | POA: Diagnosis not present

## 2016-12-19 NOTE — Telephone Encounter (Signed)
Called to inform pt of provider recommendations and while she was out and about she went to urgent care and they treated her so no antibx called in for her.

## 2016-12-19 NOTE — Telephone Encounter (Signed)
Cipro 250mg  BID for 5 days, not improved come in for visit

## 2016-12-19 NOTE — Telephone Encounter (Signed)
Pt called and states that she has frequent cysitis and would like to know if we can call her in something?  She is having a lot of burning. Offered apt for tom but she wanted me to ask if we could call something in instead of her waiting until tom.

## 2017-01-30 DIAGNOSIS — E119 Type 2 diabetes mellitus without complications: Secondary | ICD-10-CM | POA: Diagnosis not present

## 2017-01-30 DIAGNOSIS — Z961 Presence of intraocular lens: Secondary | ICD-10-CM | POA: Diagnosis not present

## 2017-01-30 DIAGNOSIS — H04123 Dry eye syndrome of bilateral lacrimal glands: Secondary | ICD-10-CM | POA: Diagnosis not present

## 2017-01-30 DIAGNOSIS — H353132 Nonexudative age-related macular degeneration, bilateral, intermediate dry stage: Secondary | ICD-10-CM | POA: Diagnosis not present

## 2017-01-30 DIAGNOSIS — H26492 Other secondary cataract, left eye: Secondary | ICD-10-CM | POA: Diagnosis not present

## 2017-02-14 ENCOUNTER — Ambulatory Visit (INDEPENDENT_AMBULATORY_CARE_PROVIDER_SITE_OTHER): Payer: PPO | Admitting: Family Medicine

## 2017-02-14 ENCOUNTER — Encounter: Payer: Self-pay | Admitting: Family Medicine

## 2017-02-14 VITALS — BP 112/64 | HR 74 | Temp 98.8°F | Resp 16 | Ht 61.5 in | Wt 178.0 lb

## 2017-02-14 DIAGNOSIS — M7552 Bursitis of left shoulder: Secondary | ICD-10-CM

## 2017-02-14 NOTE — Progress Notes (Signed)
Subjective:    Patient ID: [name], female    DOB: 28-Jun-1940, 77 y.o.   MRN: [id number]  HPI Patient reports a two-week history of pain in her left shoulder in the subacromial space. She states that it aches and throbs. She has severe pain whenever she tries to lift arm over her head. It hurts when she comes her hair brushes her teeth. It hurts for her to push her on. She denies any falls or injuries. She has pain with abduction greater than 100. She has a positive empty can sign which elicits pain. She has a positive Hawkins sign which elicits pain. Past Medical History:  Diagnosis Date  . Acquired vaginal enterocele 2018   s/p sacrospinal ligament fixation and posterior colporrhaphy Dr. Zigmund Daniel at [facility]  . Dementia    mild mmse 05/2016 (could not remember 3 objects, unable to perform serial 7's)  . Diabetes mellitus without complication ([place])   . Diverticulosis   . Hyperlipidemia   . Hypertension    Past Surgical History:  Procedure Laterality Date  . ABDOMINAL HYSTERECTOMY    . bladder tack    . CHOLECYSTECTOMY    . COLONOSCOPY N/A 05/18/2013   Procedure: COLONOSCOPY;  Surgeon: Wonda Horner, MD;  Location: [facility] ENDOSCOPY;  Service: Endoscopy;  Laterality: N/A;   Current Outpatient Medications on File Prior to Visit  Medication Sig Dispense Refill  . aspirin 81 MG chewable tablet Chew 81 mg by mouth.    . beta carotene w/minerals (OCUVITE) tablet Take 1 tablet by mouth daily.    . Calcium Carb-Cholecalciferol (CALCIUM 1000 + D PO) Take by mouth daily.    . Cholecalciferol (VITAMIN D) 2000 UNITS CAPS Take 2,000 Units by mouth daily.    . clotrimazole-betamethasone (LOTRISONE) cream Apply 1 application topically 2 (two) times daily. 30 g 3  . escitalopram (LEXAPRO) 10 MG tablet Take 1 tablet (10 mg total) by mouth daily. 90 tablet 3  . fish oil-omega-3 fatty acids 1000 MG capsule Take 1,200 mg by mouth daily.    Marland Kitchen glucosamine-chondroitin 500-400 MG tablet Take 1 tablet by  mouth daily.     Marland Kitchen glucose blood (ONE TOUCH ULTRA TEST) test strip CHECK BLOOD SUGAR EVERYDAY IN THE MORNING DX - E11.9 100 each 3  . hydrOXYzine (ATARAX/VISTARIL) 10 MG tablet TAKE 1 TABLET BY MOUTH EVERY DAY 90 tablet 2  . JANUVIA 100 MG tablet TAKE 1 TABLET BY MOUTH EVERY DAY 30 tablet 0  . losartan (COZAAR) 100 MG tablet Take 1 tablet (100 mg total) by mouth daily. 30 tablet 5  . memantine (NAMENDA) 10 MG tablet TAKE 1 TABLET BY MOUTH TWICE A DAY 60 tablet 3  . Multiple Vitamin (MULTIVITAMIN) tablet Take 1 tablet by mouth daily.    Marland Kitchen NIACIN PO Take 400 mg by mouth daily.    Marland Kitchen omeprazole (PRILOSEC) 40 MG capsule TAKE ONE CAPSULE BY MOUTH EVERY MORNING 90 capsule 3  . pravastatin (PRAVACHOL) 40 MG tablet TAKE 1 TABLET BY MOUTH AT BEDTIME 90 tablet 3   No current facility-administered medications on file prior to visit.    Allergies  Allergen Reactions  . Aricept [Donepezil Hcl]   . Bactrim [Sulfamethoxazole-Trimethoprim]   . Flagyl [Metronidazole] Hives  . Lisinopril     hyperkalemia   Social History   Socioeconomic History  . Marital status: Married    Spouse name: Not on file  . Number of children: Not on file  . Years of education: Not on file  .  Highest education level: Not on file  Social Needs  . Financial resource strain: Not on file  . Food insecurity - worry: Not on file  . Food insecurity - inability: Not on file  . Transportation needs - medical: Not on file  . Transportation needs - non-medical: Not on file  Occupational History  . Not on file  Tobacco Use  . Smoking status: Never Smoker  . Smokeless tobacco: Never Used  Substance and Sexual Activity  . Alcohol use: Yes    Alcohol/week: 1.2 oz    Types: 2 Shots of liquor per week    Comment: 2 drinks of gin a day  . Drug use: No  . Sexual activity: Yes  Other Topics Concern  . Not on file  Social History Narrative  . Not on file      Review of Systems  All other systems reviewed and are  negative.      Objective:   Physical Exam  Cardiovascular: Normal rate, regular rhythm and normal heart sounds.  Pulmonary/Chest: Effort normal and breath sounds normal. No respiratory distress. She has no wheezes. She has no rales.  Musculoskeletal:       Left shoulder: She exhibits decreased range of motion, tenderness, crepitus and pain. She exhibits no bony tenderness, no swelling, no effusion and normal strength.  Vitals reviewed.         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 Using sterile technique, I injected the left subacromial space with 2 mL of lidocaine, 2 mL of Marcaine, and 2 mL of 40 mg per mL Kenalog. Patient tolerated procedure well without complication

## 2017-02-25 DIAGNOSIS — H43812 Vitreous degeneration, left eye: Secondary | ICD-10-CM | POA: Diagnosis not present

## 2017-02-25 DIAGNOSIS — H353131 Nonexudative age-related macular degeneration, bilateral, early dry stage: Secondary | ICD-10-CM | POA: Diagnosis not present

## 2017-02-25 DIAGNOSIS — E113292 Type 2 diabetes mellitus with mild nonproliferative diabetic retinopathy without macular edema, left eye: Secondary | ICD-10-CM | POA: Diagnosis not present

## 2017-02-25 DIAGNOSIS — E113211 Type 2 diabetes mellitus with mild nonproliferative diabetic retinopathy with macular edema, right eye: Secondary | ICD-10-CM | POA: Diagnosis not present

## 2017-02-25 LAB — HM DIABETES EYE EXAM

## 2017-02-27 DIAGNOSIS — E113211 Type 2 diabetes mellitus with mild nonproliferative diabetic retinopathy with macular edema, right eye: Secondary | ICD-10-CM | POA: Diagnosis not present

## 2017-02-27 DIAGNOSIS — E113292 Type 2 diabetes mellitus with mild nonproliferative diabetic retinopathy without macular edema, left eye: Secondary | ICD-10-CM | POA: Diagnosis not present

## 2017-02-27 DIAGNOSIS — H353131 Nonexudative age-related macular degeneration, bilateral, early dry stage: Secondary | ICD-10-CM | POA: Diagnosis not present

## 2017-03-21 ENCOUNTER — Encounter: Payer: Self-pay | Admitting: *Deleted

## 2017-03-26 ENCOUNTER — Other Ambulatory Visit: Payer: Self-pay | Admitting: Family Medicine

## 2017-03-29 ENCOUNTER — Other Ambulatory Visit: Payer: Self-pay | Admitting: Family Medicine

## 2017-04-03 DIAGNOSIS — W19XXXA Unspecified fall, initial encounter: Secondary | ICD-10-CM | POA: Diagnosis not present

## 2017-04-03 DIAGNOSIS — S82892A Other fracture of left lower leg, initial encounter for closed fracture: Secondary | ICD-10-CM | POA: Diagnosis not present

## 2017-04-03 DIAGNOSIS — M25572 Pain in left ankle and joints of left foot: Secondary | ICD-10-CM | POA: Diagnosis not present

## 2017-04-03 DIAGNOSIS — M79605 Pain in left leg: Secondary | ICD-10-CM | POA: Diagnosis not present

## 2017-04-03 DIAGNOSIS — Y9301 Activity, walking, marching and hiking: Secondary | ICD-10-CM | POA: Diagnosis not present

## 2017-04-04 DIAGNOSIS — S82455A Nondisplaced comminuted fracture of shaft of left fibula, initial encounter for closed fracture: Secondary | ICD-10-CM | POA: Diagnosis not present

## 2017-04-04 DIAGNOSIS — S8255XA Nondisplaced fracture of medial malleolus of left tibia, initial encounter for closed fracture: Secondary | ICD-10-CM | POA: Diagnosis not present

## 2017-04-08 ENCOUNTER — Ambulatory Visit: Payer: PPO | Admitting: Neurology

## 2017-04-11 DIAGNOSIS — S82455D Nondisplaced comminuted fracture of shaft of left fibula, subsequent encounter for closed fracture with routine healing: Secondary | ICD-10-CM | POA: Diagnosis not present

## 2017-04-30 ENCOUNTER — Other Ambulatory Visit: Payer: Self-pay | Admitting: Family Medicine

## 2017-05-09 DIAGNOSIS — S82455D Nondisplaced comminuted fracture of shaft of left fibula, subsequent encounter for closed fracture with routine healing: Secondary | ICD-10-CM | POA: Diagnosis not present

## 2017-05-22 ENCOUNTER — Other Ambulatory Visit: Payer: Self-pay | Admitting: Family Medicine

## 2017-05-28 ENCOUNTER — Other Ambulatory Visit: Payer: Self-pay | Admitting: Family Medicine

## 2017-05-30 DIAGNOSIS — S82455D Nondisplaced comminuted fracture of shaft of left fibula, subsequent encounter for closed fracture with routine healing: Secondary | ICD-10-CM | POA: Diagnosis not present

## 2017-06-07 DIAGNOSIS — M25552 Pain in left hip: Secondary | ICD-10-CM | POA: Diagnosis not present

## 2017-06-07 DIAGNOSIS — T148XXA Other injury of unspecified body region, initial encounter: Secondary | ICD-10-CM | POA: Diagnosis not present

## 2017-06-08 ENCOUNTER — Encounter (HOSPITAL_COMMUNITY): Payer: Self-pay

## 2017-06-08 ENCOUNTER — Encounter (HOSPITAL_COMMUNITY)
Admission: EM | Disposition: A | Discharge status: Skilled Nursing Facility | Payer: Self-pay | Source: Home / Self Care | Attending: Family Medicine

## 2017-06-08 ENCOUNTER — Emergency Department (HOSPITAL_COMMUNITY): Payer: PPO

## 2017-06-08 ENCOUNTER — Inpatient Hospital Stay (HOSPITAL_COMMUNITY): Payer: PPO | Admitting: Certified Registered"

## 2017-06-08 ENCOUNTER — Other Ambulatory Visit: Payer: Self-pay

## 2017-06-08 ENCOUNTER — Inpatient Hospital Stay (HOSPITAL_COMMUNITY)
Admission: EM | Admit: 2017-06-08 | Discharge: 2017-06-13 | DRG: 481 | Disposition: A | Discharge status: Skilled Nursing Facility | Payer: PPO | Attending: Internal Medicine | Admitting: Internal Medicine

## 2017-06-08 ENCOUNTER — Inpatient Hospital Stay (HOSPITAL_COMMUNITY): Payer: PPO

## 2017-06-08 DIAGNOSIS — E119 Type 2 diabetes mellitus without complications: Secondary | ICD-10-CM | POA: Diagnosis not present

## 2017-06-08 DIAGNOSIS — S72142A Displaced intertrochanteric fracture of left femur, initial encounter for closed fracture: Principal | ICD-10-CM | POA: Diagnosis present

## 2017-06-08 DIAGNOSIS — R9431 Abnormal electrocardiogram [ECG] [EKG]: Secondary | ICD-10-CM | POA: Diagnosis not present

## 2017-06-08 DIAGNOSIS — M62838 Other muscle spasm: Secondary | ICD-10-CM | POA: Diagnosis not present

## 2017-06-08 DIAGNOSIS — Z90710 Acquired absence of both cervix and uterus: Secondary | ICD-10-CM

## 2017-06-08 DIAGNOSIS — Z8249 Family history of ischemic heart disease and other diseases of the circulatory system: Secondary | ICD-10-CM | POA: Diagnosis not present

## 2017-06-08 DIAGNOSIS — F0390 Unspecified dementia without behavioral disturbance: Secondary | ICD-10-CM | POA: Diagnosis not present

## 2017-06-08 DIAGNOSIS — F329 Major depressive disorder, single episode, unspecified: Secondary | ICD-10-CM | POA: Diagnosis not present

## 2017-06-08 DIAGNOSIS — Z7982 Long term (current) use of aspirin: Secondary | ICD-10-CM

## 2017-06-08 DIAGNOSIS — M25552 Pain in left hip: Secondary | ICD-10-CM | POA: Diagnosis not present

## 2017-06-08 DIAGNOSIS — Z419 Encounter for procedure for purposes other than remedying health state, unspecified: Secondary | ICD-10-CM | POA: Diagnosis not present

## 2017-06-08 DIAGNOSIS — Z111 Encounter for screening for respiratory tuberculosis: Secondary | ICD-10-CM | POA: Diagnosis not present

## 2017-06-08 DIAGNOSIS — M2550 Pain in unspecified joint: Secondary | ICD-10-CM | POA: Diagnosis not present

## 2017-06-08 DIAGNOSIS — S72002A Fracture of unspecified part of neck of left femur, initial encounter for closed fracture: Secondary | ICD-10-CM | POA: Diagnosis present

## 2017-06-08 DIAGNOSIS — E1122 Type 2 diabetes mellitus with diabetic chronic kidney disease: Secondary | ICD-10-CM | POA: Diagnosis not present

## 2017-06-08 DIAGNOSIS — R278 Other lack of coordination: Secondary | ICD-10-CM | POA: Diagnosis not present

## 2017-06-08 DIAGNOSIS — S72002D Fracture of unspecified part of neck of left femur, subsequent encounter for closed fracture with routine healing: Secondary | ICD-10-CM | POA: Diagnosis not present

## 2017-06-08 DIAGNOSIS — I10 Essential (primary) hypertension: Secondary | ICD-10-CM | POA: Diagnosis not present

## 2017-06-08 DIAGNOSIS — N179 Acute kidney failure, unspecified: Secondary | ICD-10-CM | POA: Diagnosis not present

## 2017-06-08 DIAGNOSIS — R52 Pain, unspecified: Secondary | ICD-10-CM | POA: Diagnosis not present

## 2017-06-08 DIAGNOSIS — D649 Anemia, unspecified: Secondary | ICD-10-CM | POA: Diagnosis not present

## 2017-06-08 DIAGNOSIS — K5900 Constipation, unspecified: Secondary | ICD-10-CM | POA: Diagnosis not present

## 2017-06-08 DIAGNOSIS — F0150 Vascular dementia without behavioral disturbance: Secondary | ICD-10-CM | POA: Diagnosis not present

## 2017-06-08 DIAGNOSIS — E569 Vitamin deficiency, unspecified: Secondary | ICD-10-CM | POA: Diagnosis not present

## 2017-06-08 DIAGNOSIS — Y92009 Unspecified place in unspecified non-institutional (private) residence as the place of occurrence of the external cause: Secondary | ICD-10-CM

## 2017-06-08 DIAGNOSIS — K219 Gastro-esophageal reflux disease without esophagitis: Secondary | ICD-10-CM | POA: Diagnosis not present

## 2017-06-08 DIAGNOSIS — R41841 Cognitive communication deficit: Secondary | ICD-10-CM | POA: Diagnosis not present

## 2017-06-08 DIAGNOSIS — E860 Dehydration: Secondary | ICD-10-CM | POA: Diagnosis not present

## 2017-06-08 DIAGNOSIS — Z7984 Long term (current) use of oral hypoglycemic drugs: Secondary | ICD-10-CM | POA: Diagnosis not present

## 2017-06-08 DIAGNOSIS — R262 Difficulty in walking, not elsewhere classified: Secondary | ICD-10-CM | POA: Diagnosis not present

## 2017-06-08 DIAGNOSIS — N183 Chronic kidney disease, stage 3 unspecified: Secondary | ICD-10-CM

## 2017-06-08 DIAGNOSIS — T148XXA Other injury of unspecified body region, initial encounter: Secondary | ICD-10-CM

## 2017-06-08 DIAGNOSIS — E785 Hyperlipidemia, unspecified: Secondary | ICD-10-CM | POA: Diagnosis present

## 2017-06-08 DIAGNOSIS — Z01818 Encounter for other preprocedural examination: Secondary | ICD-10-CM | POA: Diagnosis not present

## 2017-06-08 DIAGNOSIS — T148XXD Other injury of unspecified body region, subsequent encounter: Secondary | ICD-10-CM | POA: Diagnosis not present

## 2017-06-08 DIAGNOSIS — I129 Hypertensive chronic kidney disease with stage 1 through stage 4 chronic kidney disease, or unspecified chronic kidney disease: Secondary | ICD-10-CM | POA: Diagnosis present

## 2017-06-08 DIAGNOSIS — Z9181 History of falling: Secondary | ICD-10-CM | POA: Diagnosis not present

## 2017-06-08 DIAGNOSIS — D62 Acute posthemorrhagic anemia: Secondary | ICD-10-CM | POA: Diagnosis not present

## 2017-06-08 DIAGNOSIS — Z7401 Bed confinement status: Secondary | ICD-10-CM | POA: Diagnosis not present

## 2017-06-08 DIAGNOSIS — M62562 Muscle wasting and atrophy, not elsewhere classified, left lower leg: Secondary | ICD-10-CM | POA: Diagnosis not present

## 2017-06-08 DIAGNOSIS — W010XXA Fall on same level from slipping, tripping and stumbling without subsequent striking against object, initial encounter: Secondary | ICD-10-CM | POA: Diagnosis present

## 2017-06-08 HISTORY — PX: INTRAMEDULLARY (IM) NAIL INTERTROCHANTERIC: SHX5875

## 2017-06-08 HISTORY — DX: Chronic kidney disease, stage 3 (moderate): N18.3

## 2017-06-08 LAB — COMPREHENSIVE METABOLIC PANEL
ALK PHOS: 80 U/L (ref 38–126)
ALT: 24 U/L (ref 14–54)
ANION GAP: 14 (ref 5–15)
AST: 35 U/L (ref 15–41)
Albumin: 4.1 g/dL (ref 3.5–5.0)
BUN: 24 mg/dL — ABNORMAL HIGH (ref 6–20)
CALCIUM: 9.3 mg/dL (ref 8.9–10.3)
CO2: 24 mmol/L (ref 22–32)
Chloride: 101 mmol/L (ref 101–111)
Creatinine, Ser: 1.09 mg/dL — ABNORMAL HIGH (ref 0.44–1.00)
GFR calc non Af Amer: 48 mL/min — ABNORMAL LOW (ref >60)
GFR, EST AFRICAN AMERICAN: 56 mL/min — AB (ref >60)
Glucose, Bld: 139 mg/dL — ABNORMAL HIGH (ref 65–99)
Potassium: 3.6 mmol/L (ref 3.5–5.1)
SODIUM: 139 mmol/L (ref 135–145)
Total Bilirubin: 0.9 mg/dL (ref 0.3–1.2)
Total Protein: 7.4 g/dL (ref 6.5–8.1)

## 2017-06-08 LAB — CBC WITH DIFFERENTIAL/PLATELET
BASOS PCT: 0 %
Basophils Absolute: 0 10*3/uL (ref 0.0–0.1)
Eosinophils Absolute: 0.1 10*3/uL (ref 0.0–0.7)
Eosinophils Relative: 1 %
HCT: 39.3 % (ref 36.0–46.0)
Hemoglobin: 13 g/dL (ref 12.0–15.0)
Lymphocytes Relative: 15 %
Lymphs Abs: 1.7 10*3/uL (ref 0.7–4.0)
MCH: 31.6 pg (ref 26.0–34.0)
MCHC: 33.1 g/dL (ref 30.0–36.0)
MCV: 95.6 fL (ref 78.0–100.0)
MONOS PCT: 6 %
Monocytes Absolute: 0.7 10*3/uL (ref 0.1–1.0)
NEUTROS ABS: 8.8 10*3/uL — AB (ref 1.7–7.7)
Neutrophils Relative %: 78 %
Platelets: 248 10*3/uL (ref 150–400)
RBC: 4.11 MIL/uL (ref 3.87–5.11)
RDW: 12.9 % (ref 11.5–15.5)
WBC: 11.3 10*3/uL — ABNORMAL HIGH (ref 4.0–10.5)

## 2017-06-08 LAB — CBC
HEMATOCRIT: 38 % (ref 36.0–46.0)
HEMOGLOBIN: 12.7 g/dL (ref 12.0–15.0)
MCH: 31.7 pg (ref 26.0–34.0)
MCHC: 33.4 g/dL (ref 30.0–36.0)
MCV: 94.8 fL (ref 78.0–100.0)
Platelets: 191 10*3/uL (ref 150–400)
RBC: 4.01 MIL/uL (ref 3.87–5.11)
RDW: 12.6 % (ref 11.5–15.5)
WBC: 18.8 10*3/uL — AB (ref 4.0–10.5)

## 2017-06-08 LAB — CBG MONITORING, ED
Glucose-Capillary: 124 mg/dL — ABNORMAL HIGH (ref 65–99)
Glucose-Capillary: 110 mg/dL — ABNORMAL HIGH (ref 65–99)

## 2017-06-08 LAB — GLUCOSE, CAPILLARY
GLUCOSE-CAPILLARY: 153 mg/dL — AB (ref 65–99)
Glucose-Capillary: 108 mg/dL — ABNORMAL HIGH (ref 65–99)
Glucose-Capillary: 113 mg/dL — ABNORMAL HIGH (ref 65–99)
Glucose-Capillary: 134 mg/dL — ABNORMAL HIGH (ref 65–99)

## 2017-06-08 LAB — TYPE AND SCREEN
ABO/RH(D): O POS
Antibody Screen: NEGATIVE

## 2017-06-08 LAB — CREATININE, SERUM
CREATININE: 1.16 mg/dL — AB (ref 0.44–1.00)
GFR, EST AFRICAN AMERICAN: 52 mL/min — AB (ref >60)
GFR, EST NON AFRICAN AMERICAN: 45 mL/min — AB (ref >60)

## 2017-06-08 LAB — PROTIME-INR
INR: 1.02
PROTHROMBIN TIME: 13.3 s (ref 11.4–15.2)

## 2017-06-08 SURGERY — FIXATION, FRACTURE, INTERTROCHANTERIC, WITH INTRAMEDULLARY ROD
Anesthesia: Monitor Anesthesia Care | Site: Hip | Laterality: Left

## 2017-06-08 MED ORDER — ONDANSETRON HCL 4 MG/2ML IJ SOLN
4.0000 mg | Freq: Once | INTRAMUSCULAR | Status: AC
  Administered 2017-06-08: 4 mg via INTRAVENOUS
  Filled 2017-06-08: qty 2

## 2017-06-08 MED ORDER — CEFAZOLIN SODIUM-DEXTROSE 2-4 GM/100ML-% IV SOLN
INTRAVENOUS | Status: AC
  Filled 2017-06-08: qty 100

## 2017-06-08 MED ORDER — SIMETHICONE 80 MG PO CHEW
160.0000 mg | CHEWABLE_TABLET | Freq: Once | ORAL | Status: DC
  Filled 2017-06-08: qty 2

## 2017-06-08 MED ORDER — METHOCARBAMOL 1000 MG/10ML IJ SOLN: 500.0000 mg | Freq: Four times a day (QID) | INTRAVENOUS | Status: DC | PRN

## 2017-06-08 MED ORDER — HYDROXYZINE HCL 10 MG PO TABS
10.0000 mg | ORAL_TABLET | Freq: Every day | ORAL | Status: DC
  Administered 2017-06-09 – 2017-06-13 (×5): 10 mg via ORAL
  Filled 2017-06-08 (×5): qty 1

## 2017-06-08 MED ORDER — FLEET ENEMA 7-19 GM/118ML RE ENEM: 1.0000 | ENEMA | Freq: Once | RECTAL | Status: DC | PRN

## 2017-06-08 MED ORDER — CHLORHEXIDINE GLUCONATE 4 % EX LIQD: 60.0000 mL | Freq: Once | CUTANEOUS | Status: DC

## 2017-06-08 MED ORDER — FENTANYL CITRATE (PF) 250 MCG/5ML IJ SOLN
INTRAMUSCULAR | Status: DC | PRN
  Administered 2017-06-08: 50 ug via INTRAVENOUS

## 2017-06-08 MED ORDER — BISACODYL 10 MG RE SUPP
10.0000 mg | Freq: Every day | RECTAL | Status: DC | PRN
  Administered 2017-06-11: 10 mg via RECTAL
  Filled 2017-06-08: qty 1

## 2017-06-08 MED ORDER — FENTANYL CITRATE (PF) 100 MCG/2ML IJ SOLN
25.0000 ug | INTRAMUSCULAR | Status: DC | PRN
  Administered 2017-06-08 (×2): 50 ug via INTRAVENOUS

## 2017-06-08 MED ORDER — VITAMIN D 50 MCG (2000 UT) PO CAPS: 2000.0000 [IU] | ORAL_CAPSULE | Freq: Every day | ORAL | Status: DC

## 2017-06-08 MED ORDER — ONDANSETRON HCL 4 MG PO TABS: 4.0000 mg | ORAL_TABLET | Freq: Four times a day (QID) | ORAL | Status: DC | PRN

## 2017-06-08 MED ORDER — FENTANYL CITRATE (PF) 250 MCG/5ML IJ SOLN
INTRAMUSCULAR | Status: AC
  Filled 2017-06-08: qty 5

## 2017-06-08 MED ORDER — PRAVASTATIN SODIUM 20 MG PO TABS
40.0000 mg | ORAL_TABLET | Freq: Every day | ORAL | Status: DC
  Administered 2017-06-08 – 2017-06-12 (×4): 40 mg via ORAL
  Filled 2017-06-08 (×5): qty 2

## 2017-06-08 MED ORDER — OMEGA-3-ACID ETHYL ESTERS 1 G PO CAPS
1.0000 g | ORAL_CAPSULE | Freq: Every day | ORAL | Status: DC
  Administered 2017-06-09 – 2017-06-13 (×5): 1 g via ORAL
  Filled 2017-06-08 (×5): qty 1

## 2017-06-08 MED ORDER — PHENYLEPHRINE 40 MCG/ML (10ML) SYRINGE FOR IV PUSH (FOR BLOOD PRESSURE SUPPORT)
PREFILLED_SYRINGE | INTRAVENOUS | Status: DC | PRN
  Administered 2017-06-08: 80 ug via INTRAVENOUS
  Administered 2017-06-08: 120 ug via INTRAVENOUS

## 2017-06-08 MED ORDER — ONDANSETRON HCL 4 MG/2ML IJ SOLN
INTRAMUSCULAR | Status: AC
  Filled 2017-06-08: qty 4

## 2017-06-08 MED ORDER — MORPHINE SULFATE (PF) 4 MG/ML IV SOLN
4.0000 mg | INTRAVENOUS | Status: DC | PRN
  Filled 2017-06-08: qty 1

## 2017-06-08 MED ORDER — FENTANYL CITRATE (PF) 100 MCG/2ML IJ SOLN
INTRAMUSCULAR | Status: AC
  Administered 2017-06-08: 50 ug via INTRAVENOUS
  Filled 2017-06-08: qty 2

## 2017-06-08 MED ORDER — VITAMIN D 1000 UNITS PO TABS
2000.0000 [IU] | ORAL_TABLET | Freq: Every day | ORAL | Status: DC
  Administered 2017-06-09 – 2017-06-13 (×5): 2000 [IU] via ORAL
  Filled 2017-06-08 (×5): qty 2

## 2017-06-08 MED ORDER — CEFAZOLIN SODIUM-DEXTROSE 2-4 GM/100ML-% IV SOLN
2.0000 g | INTRAVENOUS | Status: AC
  Administered 2017-06-08: 2 g via INTRAVENOUS

## 2017-06-08 MED ORDER — INSULIN ASPART 100 UNIT/ML ~~LOC~~ SOLN
0.0000 [IU] | SUBCUTANEOUS | Status: DC
  Administered 2017-06-08: 1 [IU] via SUBCUTANEOUS
  Administered 2017-06-08 – 2017-06-09 (×6): 2 [IU] via SUBCUTANEOUS
  Administered 2017-06-09 – 2017-06-10 (×5): 1 [IU] via SUBCUTANEOUS
  Administered 2017-06-11: 2 [IU] via SUBCUTANEOUS
  Administered 2017-06-11 (×2): 1 [IU] via SUBCUTANEOUS
  Administered 2017-06-12: 2 [IU] via SUBCUTANEOUS
  Administered 2017-06-12 – 2017-06-13 (×2): 1 [IU] via SUBCUTANEOUS
  Filled 2017-06-08: qty 1

## 2017-06-08 MED ORDER — LACTATED RINGERS IV SOLN: INTRAVENOUS | Status: DC

## 2017-06-08 MED ORDER — PROPOFOL 10 MG/ML IV BOLUS
INTRAVENOUS | Status: AC
  Filled 2017-06-08: qty 20

## 2017-06-08 MED ORDER — HYDROCODONE-ACETAMINOPHEN 5-325 MG PO TABS
1.0000 | ORAL_TABLET | ORAL | Status: DC | PRN
  Administered 2017-06-10 – 2017-06-12 (×2): 1 via ORAL
  Administered 2017-06-13: 2 via ORAL
  Filled 2017-06-08: qty 2
  Filled 2017-06-08 (×3): qty 1

## 2017-06-08 MED ORDER — ENOXAPARIN SODIUM 40 MG/0.4ML ~~LOC~~ SOLN
40.0000 mg | SUBCUTANEOUS | Status: DC
  Administered 2017-06-09 – 2017-06-13 (×5): 40 mg via SUBCUTANEOUS
  Filled 2017-06-08 (×5): qty 0.4

## 2017-06-08 MED ORDER — CEFAZOLIN SODIUM-DEXTROSE 2-4 GM/100ML-% IV SOLN
2.0000 g | Freq: Four times a day (QID) | INTRAVENOUS | Status: AC
  Administered 2017-06-08 (×2): 2 g via INTRAVENOUS
  Filled 2017-06-08 (×2): qty 100

## 2017-06-08 MED ORDER — KETOROLAC TROMETHAMINE 15 MG/ML IJ SOLN
INTRAMUSCULAR | Status: AC
  Administered 2017-06-08: 7.5 mg via INTRAVENOUS
  Filled 2017-06-08: qty 1

## 2017-06-08 MED ORDER — METOCLOPRAMIDE HCL 5 MG PO TABS: 5.0000 mg | ORAL_TABLET | Freq: Three times a day (TID) | ORAL | Status: DC | PRN

## 2017-06-08 MED ORDER — METHOCARBAMOL 500 MG PO TABS
500.0000 mg | ORAL_TABLET | Freq: Four times a day (QID) | ORAL | Status: DC | PRN
  Administered 2017-06-08 – 2017-06-10 (×3): 500 mg via ORAL
  Filled 2017-06-08 (×2): qty 1

## 2017-06-08 MED ORDER — CALCIUM CARBONATE-VITAMIN D 500-200 MG-UNIT PO TABS
1.0000 | ORAL_TABLET | Freq: Every day | ORAL | Status: DC
  Administered 2017-06-09 – 2017-06-13 (×5): 1 via ORAL
  Filled 2017-06-08 (×5): qty 1

## 2017-06-08 MED ORDER — ADULT MULTIVITAMIN W/MINERALS CH
1.0000 | ORAL_TABLET | Freq: Every day | ORAL | Status: DC
  Administered 2017-06-09 – 2017-06-13 (×5): 1 via ORAL
  Filled 2017-06-08 (×5): qty 1

## 2017-06-08 MED ORDER — MEMANTINE HCL 10 MG PO TABS
10.0000 mg | ORAL_TABLET | Freq: Two times a day (BID) | ORAL | Status: DC
  Administered 2017-06-08 – 2017-06-13 (×10): 10 mg via ORAL
  Filled 2017-06-08 (×10): qty 1

## 2017-06-08 MED ORDER — HYDROCODONE-ACETAMINOPHEN 5-325 MG PO TABS: 1.0000 | ORAL_TABLET | Freq: Four times a day (QID) | ORAL | 0 refills | Status: DC | PRN

## 2017-06-08 MED ORDER — LOSARTAN POTASSIUM 50 MG PO TABS
100.0000 mg | ORAL_TABLET | Freq: Every day | ORAL | Status: DC
  Administered 2017-06-09: 100 mg via ORAL
  Filled 2017-06-08: qty 2

## 2017-06-08 MED ORDER — POVIDONE-IODINE 10 % EX SWAB: 2.0000 "application " | Freq: Once | CUTANEOUS | Status: DC

## 2017-06-08 MED ORDER — BUPIVACAINE IN DEXTROSE 0.75-8.25 % IT SOLN
INTRATHECAL | Status: DC | PRN
  Administered 2017-06-08: 1.8 mL via INTRATHECAL

## 2017-06-08 MED ORDER — KETOROLAC TROMETHAMINE 15 MG/ML IJ SOLN
7.5000 mg | Freq: Four times a day (QID) | INTRAMUSCULAR | Status: DC
  Administered 2017-06-08 – 2017-06-09 (×3): 7.5 mg via INTRAVENOUS
  Filled 2017-06-08: qty 1

## 2017-06-08 MED ORDER — PROPOFOL 500 MG/50ML IV EMUL
INTRAVENOUS | Status: DC | PRN
  Administered 2017-06-08: 50 ug/kg/min via INTRAVENOUS

## 2017-06-08 MED ORDER — FENTANYL CITRATE (PF) 100 MCG/2ML IJ SOLN
50.0000 ug | Freq: Once | INTRAMUSCULAR | Status: AC
  Administered 2017-06-08: 50 ug via INTRAVENOUS

## 2017-06-08 MED ORDER — OMEGA-3 FATTY ACIDS 1000 MG PO CAPS: 1200.0000 mg | ORAL_CAPSULE | Freq: Every day | ORAL | Status: DC

## 2017-06-08 MED ORDER — 0.9 % SODIUM CHLORIDE (POUR BTL) OPTIME
TOPICAL | Status: DC | PRN
  Administered 2017-06-08: 1000 mL

## 2017-06-08 MED ORDER — LIDOCAINE 2% (20 MG/ML) 5 ML SYRINGE
INTRAMUSCULAR | Status: AC
  Filled 2017-06-08: qty 10

## 2017-06-08 MED ORDER — ENOXAPARIN SODIUM 40 MG/0.4ML ~~LOC~~ SOLN: 40.0000 mg | SUBCUTANEOUS | 0 refills | Status: DC

## 2017-06-08 MED ORDER — PANTOPRAZOLE SODIUM 40 MG PO TBEC
80.0000 mg | DELAYED_RELEASE_TABLET | Freq: Every day | ORAL | Status: DC
  Administered 2017-06-09 – 2017-06-13 (×5): 80 mg via ORAL
  Filled 2017-06-08 (×5): qty 2

## 2017-06-08 MED ORDER — ESCITALOPRAM OXALATE 10 MG PO TABS
10.0000 mg | ORAL_TABLET | Freq: Every day | ORAL | Status: DC
  Administered 2017-06-09 – 2017-06-13 (×5): 10 mg via ORAL
  Filled 2017-06-08 (×5): qty 1

## 2017-06-08 MED ORDER — METHOCARBAMOL 500 MG PO TABS
ORAL_TABLET | ORAL | Status: AC
  Administered 2017-06-08: 500 mg via ORAL
  Filled 2017-06-08: qty 1

## 2017-06-08 MED ORDER — METOCLOPRAMIDE HCL 5 MG/ML IJ SOLN: 5.0000 mg | Freq: Three times a day (TID) | INTRAMUSCULAR | Status: DC | PRN

## 2017-06-08 MED ORDER — PHENYLEPHRINE HCL 10 MG/ML IJ SOLN
INTRAVENOUS | Status: DC | PRN
  Administered 2017-06-08: 50 ug/min via INTRAVENOUS

## 2017-06-08 MED ORDER — ACETAMINOPHEN 325 MG PO TABS
325.0000 mg | ORAL_TABLET | Freq: Four times a day (QID) | ORAL | Status: DC | PRN
  Administered 2017-06-09 – 2017-06-12 (×4): 650 mg via ORAL
  Filled 2017-06-08 (×4): qty 2

## 2017-06-08 MED ORDER — CALCIUM CARB-CHOLECALCIFEROL 1000-800 MG-UNIT PO TABS: ORAL_TABLET | Freq: Every day | ORAL | Status: DC

## 2017-06-08 MED ORDER — SUGAMMADEX SODIUM 200 MG/2ML IV SOLN
INTRAVENOUS | Status: AC
  Filled 2017-06-08: qty 4

## 2017-06-08 MED ORDER — LACTATED RINGERS IV SOLN
INTRAVENOUS | Status: DC | PRN
  Administered 2017-06-08: 10:00:00 via INTRAVENOUS

## 2017-06-08 MED ORDER — ACETAMINOPHEN 500 MG PO TABS
500.0000 mg | ORAL_TABLET | Freq: Four times a day (QID) | ORAL | Status: DC
  Administered 2017-06-08 – 2017-06-09 (×2): 500 mg via ORAL
  Filled 2017-06-08: qty 1

## 2017-06-08 MED ORDER — MORPHINE SULFATE (PF) 2 MG/ML IV SOLN
2.0000 mg | INTRAVENOUS | Status: DC | PRN
  Administered 2017-06-08: 4 mg via INTRAVENOUS

## 2017-06-08 MED ORDER — POLYETHYLENE GLYCOL 3350 17 G PO PACK: 17.0000 g | PACK | Freq: Every day | ORAL | Status: DC | PRN

## 2017-06-08 MED ORDER — ONDANSETRON HCL 4 MG/2ML IJ SOLN: 4.0000 mg | Freq: Four times a day (QID) | INTRAMUSCULAR | Status: DC | PRN

## 2017-06-08 MED ORDER — DOCUSATE SODIUM 100 MG PO CAPS
100.0000 mg | ORAL_CAPSULE | Freq: Two times a day (BID) | ORAL | Status: DC
  Administered 2017-06-09: 100 mg via ORAL
  Filled 2017-06-08: qty 1

## 2017-06-08 MED ORDER — EPHEDRINE SULFATE-NACL 50-0.9 MG/10ML-% IV SOSY
PREFILLED_SYRINGE | INTRAVENOUS | Status: DC | PRN
  Administered 2017-06-08 (×2): 10 mg via INTRAVENOUS

## 2017-06-08 MED ORDER — HYDROCODONE-ACETAMINOPHEN 5-325 MG PO TABS: 1.0000 | ORAL_TABLET | Freq: Four times a day (QID) | ORAL | Status: DC | PRN

## 2017-06-08 MED ORDER — SUGAMMADEX SODIUM 500 MG/5ML IV SOLN
INTRAVENOUS | Status: AC
  Filled 2017-06-08: qty 5

## 2017-06-08 MED ORDER — DEXAMETHASONE SODIUM PHOSPHATE 10 MG/ML IJ SOLN
INTRAMUSCULAR | Status: AC
  Filled 2017-06-08: qty 2

## 2017-06-08 SURGICAL SUPPLY — 35 items
BNDG COHESIVE 4X5 TAN STRL (GAUZE/BANDAGES/DRESSINGS) ×3 IMPLANT
BNDG GAUZE ELAST 4 BULKY (GAUZE/BANDAGES/DRESSINGS) ×3 IMPLANT
COVER PERINEAL POST (MISCELLANEOUS) ×2 IMPLANT
COVER SURGICAL LIGHT HANDLE (MISCELLANEOUS) ×2 IMPLANT
DRAPE INCISE IOBAN 85X60 (DRAPES) ×1 IMPLANT
DRAPE STERI IOBAN 125X83 (DRAPES) ×2 IMPLANT
DRSG MEPILEX BORDER 4X4 (GAUZE/BANDAGES/DRESSINGS) ×5 IMPLANT
DURAPREP 26ML APPLICATOR (WOUND CARE) ×2 IMPLANT
ELECT REM PT RETURN 9FT ADLT (ELECTROSURGICAL) ×2
ELECTRODE REM PT RTRN 9FT ADLT (ELECTROSURGICAL) ×1 IMPLANT
GLOVE BIO SURGEON STRL SZ7.5 (GLOVE) ×4 IMPLANT
GLOVE BIOGEL PI IND STRL 8 (GLOVE) ×2 IMPLANT
GLOVE BIOGEL PI INDICATOR 8 (GLOVE) ×2
GOWN STRL REUS W/ TWL LRG LVL3 (GOWN DISPOSABLE) ×3 IMPLANT
GOWN STRL REUS W/TWL LRG LVL3 (GOWN DISPOSABLE) ×6
GUIDEROD T2 3X1000 (ROD) ×1 IMPLANT
K-WIRE  3.2X450M STR (WIRE) ×3
K-WIRE 3.2X450M STR (WIRE) ×3
KIT NAIL LONG 10X360MMX125 (Nail) ×1 IMPLANT
KIT TURNOVER KIT B (KITS) ×2 IMPLANT
KWIRE 3.2X450M STR (WIRE) IMPLANT
MANIFOLD NEPTUNE II (INSTRUMENTS) ×2 IMPLANT
NS IRRIG 1000ML POUR BTL (IV SOLUTION) ×2 IMPLANT
PACK GENERAL/GYN (CUSTOM PROCEDURE TRAY) ×2 IMPLANT
PAD ARMBOARD 7.5X6 YLW CONV (MISCELLANEOUS) ×2 IMPLANT
PAD CAST 4YDX4 CTTN HI CHSV (CAST SUPPLIES) ×1 IMPLANT
PADDING CAST COTTON 4X4 STRL (CAST SUPPLIES) ×2
REAMER SHAFT BIXCUT (INSTRUMENTS) ×1 IMPLANT
SCREW LAG GAMMA 3 TI 10.5X100M (Screw) ×1 IMPLANT
SUT MNCRL AB 4-0 PS2 18 (SUTURE) IMPLANT
SUT MON AB 2-0 CT1 27 (SUTURE) ×2 IMPLANT
SUT VIC AB 0 CT1 27 (SUTURE) ×4
SUT VIC AB 0 CT1 27XBRD ANBCTR (SUTURE) ×1 IMPLANT
TOWEL OR 17X24 6PK STRL BLUE (TOWEL DISPOSABLE) ×2 IMPLANT
TOWEL OR 17X26 10 PK STRL BLUE (TOWEL DISPOSABLE) ×2 IMPLANT

## 2017-06-08 NOTE — ED Provider Notes (Signed)
[facility] DEPT Provider Note   CSN: [id number] Arrival date & time: 06/08/17  0111     History   Chief Complaint Chief Complaint  Patient presents with  . Fall  . Left hip pain/deformity    HPI [name] is a 77 y.o. female.  Patient presents with left hip pain after slipping and falling.  States she lost her balance and landed onto her left hip.  Denies hitting head or losing consciousness.  Complains of pain to left hip and thigh unable to bear weight.  No weakness, numbness or tingling.  Denies any head or neck pain.  She does not take any blood thinners.  No chest pain or shortness of breath.  No abdominal pain.  Husband at bedside reports that she has some mild dementia.  Patient sees Dr. French Ana orthopedics though she is never had surgery in the past.  The history is provided by the patient and the EMS personnel.  Fall  Pertinent negatives include no chest pain, no abdominal pain, no headaches and no shortness of breath.    Past Medical History:  Diagnosis Date  . Acquired vaginal enterocele 2018   s/p sacrospinal ligament fixation and posterior colporrhaphy Dr. Zigmund Daniel at [facility]  . Dementia    mild mmse 05/2016 (could not remember 3 objects, unable to perform serial 7's)  . Diabetes mellitus without complication ([place])   . Diverticulosis   . Hyperlipidemia   . Hypertension     Patient Active Problem List   Diagnosis Date Noted  . Mild dementia 10/11/2016  . Situational depression 10/11/2016  . Osteopenia 05/11/2014  . Acute blood loss anemia 07/29/2013  . Dizzy spells 07/29/2013  . Lower GI bleed 05/14/2013  . GI bleed 05/14/2013  . Diabetes mellitus without complication ([place])   . Hypertension   . Hyperlipidemia   . Diverticulosis     Past Surgical History:  Procedure Laterality Date  . ABDOMINAL HYSTERECTOMY    . bladder tack    . CHOLECYSTECTOMY    . COLONOSCOPY N/A 05/18/2013   Procedure: COLONOSCOPY;  Surgeon:  Wonda Horner, MD;  Location: [facility];  Service: Endoscopy;  Laterality: N/A;     OB History   None      Home Medications    Prior to Admission medications   Medication Sig Start Date End Date Taking? Authorizing Provider  aspirin 81 MG chewable tablet Chew 81 mg by mouth.    [provider]  beta carotene w/minerals (OCUVITE) tablet Take 1 tablet by mouth daily.    [provider]  Calcium Carb-Cholecalciferol (CALCIUM 1000 + D PO) Take by mouth daily.    [provider]  Cholecalciferol (VITAMIN D) 2000 UNITS CAPS Take 2,000 Units by mouth daily.    [provider]  clotrimazole-betamethasone (LOTRISONE) cream Apply 1 application topically 2 (two) times daily. 09/28/16   Susy Frizzle, MD  escitalopram (LEXAPRO) 10 MG tablet Take 1 tablet (10 mg total) by mouth daily. 08/28/16   Susy Frizzle, MD  fish oil-omega-3 fatty acids 1000 MG capsule Take 1,200 mg by mouth daily.    [provider]  glucosamine-chondroitin 500-400 MG tablet Take 1 tablet by mouth daily.     [provider]  glucose blood (ONE TOUCH ULTRA TEST) test strip CHECK BLOOD SUGAR EVERYDAY IN THE MORNING DX - E11.9 07/19/16   Susy Frizzle, MD  hydrOXYzine (ATARAX/VISTARIL) 10 MG tablet TAKE 1 TABLET BY MOUTH EVERY DAY 09/19/16  Susy Frizzle, MD  JANUVIA 100 MG tablet TAKE 1 TABLET BY MOUTH EVERY DAY 05/22/17   Susy Frizzle, MD  losartan (COZAAR) 100 MG tablet Take 1 tablet (100 mg total) by mouth daily. 02/10/16   Susy Frizzle, MD  losartan (COZAAR) 100 MG tablet TAKE 1 TABLET BY MOUTH EVERY DAY 03/29/17   Susy Frizzle, MD  memantine (NAMENDA) 10 MG tablet TAKE 1 TABLET BY MOUTH TWICE A DAY 05/28/17   Susy Frizzle, MD  Multiple Vitamin (MULTIVITAMIN) tablet Take 1 tablet by mouth daily.    [provider]  NIACIN PO Take 400 mg by mouth daily.    [provider]  omeprazole (PRILOSEC) 40 MG capsule TAKE ONE CAPSULE BY  MOUTH EVERY MORNING 06/11/16   Susy Frizzle, MD  pravastatin (PRAVACHOL) 40 MG tablet TAKE 1 TABLET BY MOUTH AT BEDTIME 11/26/16   Susy Frizzle, MD    Family History Family History  Problem Relation Age of Onset  . CAD Mother   . Heart attack Father     Social History Social History   Tobacco Use  . Smoking status: Never Smoker  . Smokeless tobacco: Never Used  Substance Use Topics  . Alcohol use: Yes    Alcohol/week: 1.2 oz    Types: 2 Shots of liquor per week    Comment: 2 drinks of gin a day  . Drug use: No     Allergies   Aricept [donepezil hcl]; Bactrim [sulfamethoxazole-trimethoprim]; Flagyl [metronidazole]; and Lisinopril   Review of Systems Review of Systems  Constitutional: Negative for activity change, appetite change and fever.  HENT: Negative for congestion and rhinorrhea.   Eyes: Negative for visual disturbance.  Respiratory: Negative for cough, chest tightness and shortness of breath.   Cardiovascular: Negative for chest pain.  Gastrointestinal: Negative for abdominal pain and nausea.  Genitourinary: Negative for dysuria and hematuria.  Musculoskeletal: Positive for arthralgias and myalgias. Negative for back pain.  Skin: Negative for rash.  Neurological: Negative for dizziness, weakness and headaches.   all other systems are negative except as noted in the HPI and PMH.     Physical Exam Updated Vital Signs BP 134/71   Pulse 71   Temp 97.7 F (36.5 C) (Oral)   Resp 16   Ht 5\' 2"  (1.575 m)   Wt 81.6 kg (180 lb)   SpO2 93%   BMI 32.92 kg/m   Physical Exam  Constitutional: She is oriented to person, place, and time. She appears well-developed and well-nourished. No distress.  HENT:  Head: Normocephalic and atraumatic.  Mouth/Throat: Oropharynx is clear and moist. No oropharyngeal exudate.  Eyes: Pupils are equal, round, and reactive to light. Conjunctivae and EOM are normal.  Neck: Normal range of motion. Neck supple.  NO C spine  tenderness  Cardiovascular: Normal rate, regular rhythm, normal heart sounds and intact distal pulses.  No murmur heard. Pulmonary/Chest: Effort normal and breath sounds normal. No respiratory distress. She exhibits no tenderness.  Abdominal: Soft. There is no tenderness. There is no rebound and no guarding.  Musculoskeletal: She exhibits tenderness and deformity. She exhibits no edema.  Left leg is shortened and externally rotated.  Intact DP pulse.  Intact distal sensation.  Neurological: She is alert and oriented to person, place, and time. No cranial nerve deficit. She exhibits normal muscle tone. Coordination normal.  No ataxia on finger to nose bilaterally. No pronator drift. 5/5 strength throughout. CN 2-12 intact.Equal grip strength. Sensation intact.  Skin: Skin is warm.  Psychiatric: She has a normal mood and affect. Her behavior is normal.  Nursing note and vitals reviewed.    ED Treatments / Results  Labs (all labs ordered are listed, but only abnormal results are displayed) Labs Reviewed  CBC WITH DIFFERENTIAL/PLATELET - Abnormal; Notable for the following components:      Result Value   WBC 11.3 (*)    Neutro Abs 8.8 (*)    All other components within normal limits  COMPREHENSIVE METABOLIC PANEL - Abnormal; Notable for the following components:   Glucose, Bld 139 (*)    BUN 24 (*)    Creatinine, Ser 1.09 (*)    GFR calc non Af Amer 48 (*)    GFR calc Af Amer 56 (*)    All other components within normal limits  CBG MONITORING, ED - Abnormal; Notable for the following components:   Glucose-Capillary 124 (*)    All other components within normal limits  PROTIME-INR  TYPE AND SCREEN    EKG EKG Interpretation  Date/Time:  Saturday Jun 08 2017 02:22:08 EDT Ventricular Rate:  80 PR Interval:    QRS Duration: 91 QT Interval:  432 QTC Calculation: 499 R Axis:   -7 Text Interpretation:  Sinus rhythm Low voltage, precordial leads Abnormal R-wave progression, early  transition Borderline prolonged QT interval No significant change was found Confirmed by Ezequiel Essex [phone]) on 06/08/2017 2:27:15 AM   Radiology Dg Chest Port 1 View  Result Date: 06/08/2017 CLINICAL DATA:  Initial preoperative evaluation for left hip fracture. EXAM: PORTABLE CHEST 1 VIEW COMPARISON:  Prior radiograph from 08/23/2010. FINDINGS: Cardiac and mediastinal silhouettes are stable in size and contour, and remain within normal limits. Lungs are hypoinflated. Secondary diffuse bronchovascular crowding. No focal infiltrates. No edema or effusion. No pneumothorax. No acute osseous abnormality. IMPRESSION: 1. Shallow lung inflation with secondary diffuse bronchovascular crowding. 2. No other active cardiopulmonary disease. Electronically Signed   By: Jeannine Boga M.D.   On: 06/08/2017 03:03   Dg Hip Unilat With Pelvis 2-3 Views Left  Result Date: 06/08/2017 CLINICAL DATA:  Left hip pain after fall. EXAM: DG HIP (WITH OR WITHOUT PELVIS) 2-3V LEFT COMPARISON:  None. FINDINGS: Acute left intertrochanteric fracture of the femur is noted with varus angulation as well as cephalad and lateral displacement of the femoral shaft relative to femoral head and neck. Intact bony pelvis. Intact right femur. Mild lumbar spondylosis with multilevel degenerative disc disease and dextroconvex curvature of the lumbar spine. Surgical clips project over the upper pelvis bilaterally. IMPRESSION: Acute displaced and varus angulated intertrochanteric fracture of the left femur. Dextroscoliosis with lumbar spondylosis. Electronically Signed   By: Ashley Royalty M.D.   On: 06/08/2017 02:11    Procedures Procedures (including critical care time)  Medications Ordered in ED Medications  morphine 4 MG/ML injection 4 mg (has no administration in time range)  ondansetron (ZOFRAN) injection 4 mg (has no administration in time range)     Initial Impression / Assessment and Plan / ED Course  I have reviewed the  triage vital signs and the nursing notes.  Pertinent labs & imaging results that were available during my care of the patient were reviewed by me and considered in my medical decision making (see chart for details).    Patient with mechanical fall now with left hip pain unable to bear weight or ambulate.  She is neurovascularly intact.  No blood thinner use.  No head or neck pain.  Imaging confirms  left hip fracture.  She is neurovascularly intact.  Discussed with Dr. Percell Miller who is covering for Dr. French Ana. Agrees with Buck's traction.  Plan admission to [facility] for repair later this morning.  Screening labs are unremarkable and EKG is sinus rhythm. No evidence of head injury.  Admission discussed with Dr. Alcario Drought.  Final Clinical Impressions(s) / ED Diagnoses   Final diagnoses:  Closed left hip fracture, initial encounter [facility])    ED Discharge Orders    None       Ezequiel Essex, MD 06/08/17 [phone]

## 2017-06-08 NOTE — ED Notes (Signed)
To xray and returned-husband at bedside

## 2017-06-08 NOTE — Anesthesia Procedure Notes (Signed)
Spinal  Patient location during procedure: OR Start time: 06/08/2017 10:24 AM End time: 06/08/2017 10:32 AM Staffing Anesthesiologist: Oleta Mouse, MD Preanesthetic Checklist Completed: patient identified, surgical consent, pre-op evaluation, timeout performed, IV checked, risks and benefits discussed and monitors and equipment checked Spinal Block Patient position: left lateral decubitus Prep: ChloraPrep and site prepped and draped Patient monitoring: heart rate, cardiac monitor, continuous pulse ox and blood pressure Approach: midline Location: L3-4 Injection technique: single-shot Needle Needle type: Pencan  Needle gauge: 24 G Needle length: 10 cm Assessment Sensory level: T6

## 2017-06-08 NOTE — ED Notes (Signed)
Patient transported to X-ray 

## 2017-06-08 NOTE — ED Notes (Signed)
Pt resting without discomfort, denies pain. Aware she is waiting for transfer to [facility], husband at bedside.

## 2017-06-08 NOTE — Anesthesia Procedure Notes (Signed)
Procedure Name: MAC Date/Time: 06/08/2017 10:20 AM Performed by: Carney Living, CRNA Pre-anesthesia Checklist: Patient identified, Emergency Drugs available, Suction available, Patient being monitored and Timeout performed Patient Re-evaluated:Patient Re-evaluated prior to induction Oxygen Delivery Method: Simple face mask Preoxygenation: Pre-oxygenation with 100% oxygen

## 2017-06-08 NOTE — Transfer of Care (Signed)
2Immediate Anesthesia Transfer of Care Note  Patient: [name]  Procedure(s) Performed: INTRAMEDULLARY (IM) NAIL LEFT HIP (Left Hip)  Patient Location: PACU  Anesthesia Type:MAC and Spinal  Level of Consciousness: awake, alert  and patient cooperative  Airway & Oxygen Therapy: Patient Spontanous Breathing and Patient connected to face mask oxygen  Post-op Assessment: Report given to RN, Post -op Vital signs reviewed and stable and Patient moving all extremities X 4  Post vital signs: Reviewed and stable  Last Vitals:  Vitals Value Taken Time  BP 104/58 06/08/2017 12:22 PM  Temp    Pulse 101 06/08/2017 12:26 PM  Resp 13 06/08/2017 12:26 PM  SpO2 99 % 06/08/2017 12:26 PM  Vitals shown include unvalidated device data.  Last Pain:  Vitals:   06/08/17 0738  TempSrc:   PainSc: 0-No pain         Complications: No apparent anesthesia complications

## 2017-06-08 NOTE — Anesthesia Postprocedure Evaluation (Signed)
Anesthesia Post Note  Patient: [name]  Procedure(s) Performed: INTRAMEDULLARY (IM) NAIL LEFT HIP (Left Hip)     Patient location during evaluation: PACU Anesthesia Type: Spinal and MAC Level of consciousness: awake and patient cooperative Pain management: pain level controlled Vital Signs Assessment: post-procedure vital signs reviewed and stable Respiratory status: spontaneous breathing, nonlabored ventilation, respiratory function stable and patient connected to nasal cannula oxygen Cardiovascular status: blood pressure returned to baseline and stable Postop Assessment: no apparent nausea or vomiting and spinal receding Anesthetic complications: no    Last Vitals:  Vitals:   06/08/17 1422 06/08/17 1507  BP: (!) 112/52 (!) 116/59  Pulse: 83 96  Resp: 11 18  Temp:  36.5 C  SpO2: 97% 96%    Last Pain:  Vitals:   06/08/17 1507  TempSrc: Oral  PainSc: 0-No pain                 Nashira Mcglynn

## 2017-06-08 NOTE — ED Triage Notes (Signed)
Patient has outward rotation of left lower extremity with swelling proximal femur area. C/O severe pain with any movement

## 2017-06-08 NOTE — ED Notes (Signed)
Bed: WA17 Expected date:  Expected time:  Means of arrival:  Comments: EMS fall/hip fracture

## 2017-06-08 NOTE — ED Notes (Signed)
[facility] present to transport pt to [facility] short stay

## 2017-06-08 NOTE — Progress Notes (Signed)
Nutrition Brief Note  Received consult from the hip fracture protocol.   Patient with no nutrition concerns PTA. Weight stable for at least the past year.  She has been eating well.   Wt Readings from Last 15 Encounters:  06/08/17 180 lb (81.6 kg)  02/14/17 178 lb (80.7 kg)  10/09/16 177 lb (80.3 kg)  09/28/16 177 lb (80.3 kg)  08/02/16 180 lb (81.6 kg)  07/06/16 181 lb (82.1 kg)  07/02/16 185 lb (83.9 kg)  06/27/16 185 lb (83.9 kg)  06/21/16 184 lb (83.5 kg)  06/12/16 183 lb (83 kg)  02/20/16 189 lb (85.7 kg)  12/20/15 191 lb (86.6 kg)  04/22/15 192 lb (87.1 kg)  02/11/15 194 lb (88 kg)  11/18/14 187 lb (84.8 kg)    Body mass index is 32.92 kg/m. Patient meets criteria for obesity based on current BMI.   Current diet order is CHO modified. Labs and medications reviewed.   No nutrition interventions warranted at this time. If nutrition issues arise, please consult RD.   Molli Barrows, RD, LDN, [place] Pager [phone] After Hours Pager [phone]

## 2017-06-08 NOTE — Progress Notes (Signed)
[name] is a 77 y.o. female with medical history significant of DM2, HTN, mild dementia.  Patient presents to the ED with c/o L hip pain after slipping and falling.  Unable to bear weight.  Pain with any attempted movement.  Associated deformity. Admitted for Left hip fracture. Dr Percell Miller consulted. Patient will transfer to [facility] for possible repair as requested by Dr Percell Miller.  06/08/17: Patient seen and examined in the ED. Pain is well controlled on current pain management. No new complaints.  Please refer to H&P dictated by Dr Alcario Drought on 06/08/17 for further details of the assessment and plan.

## 2017-06-08 NOTE — Consult Note (Signed)
ORTHOPAEDIC CONSULTATION  REQUESTING PHYSICIAN: Samuella Cota, MD  Chief Complaint: left hip fracture  HPI: [name] is a 77 y.o. female who complains of a mechanical fall on the left side with L groin pain  Past Medical History:  Diagnosis Date  . Acquired vaginal enterocele 2018   s/p sacrospinal ligament fixation and posterior colporrhaphy Dr. Zigmund Daniel at [facility]  . Dementia    mild mmse 05/2016 (could not remember 3 objects, unable to perform serial 7's)  . Diabetes mellitus without complication ([place])   . Diverticulosis   . Hyperlipidemia   . Hypertension    Past Surgical History:  Procedure Laterality Date  . ABDOMINAL HYSTERECTOMY    . bladder tack    . CHOLECYSTECTOMY    . COLONOSCOPY N/A 05/18/2013   Procedure: COLONOSCOPY;  Surgeon: Wonda Horner, MD;  Location: [facility];  Service: Endoscopy;  Laterality: N/A;   Social History   Socioeconomic History  . Marital status: Married    Spouse name: Not on file  . Number of children: Not on file  . Years of education: Not on file  . Highest education level: Not on file  Occupational History  . Not on file  Social Needs  . Financial resource strain: Not on file  . Food insecurity:    Worry: Not on file    Inability: Not on file  . Transportation needs:    Medical: Not on file    Non-medical: Not on file  Tobacco Use  . Smoking status: Never Smoker  . Smokeless tobacco: Never Used  Substance and Sexual Activity  . Alcohol use: Yes    Alcohol/week: 1.2 oz    Types: 2 Shots of liquor per week    Comment: 2 drinks of gin a day  . Drug use: No  . Sexual activity: Yes  Lifestyle  . Physical activity:    Days per week: Not on file    Minutes per session: Not on file  . Stress: Not on file  Relationships  . Social connections:    Talks on phone: Not on file    Gets together: Not on file    Attends religious service: Not on file    Active member of club or organization: Not on file   Attends meetings of clubs or organizations: Not on file    Relationship status: Not on file  Other Topics Concern  . Not on file  Social History Narrative  . Not on file   Family History  Problem Relation Age of Onset  . CAD Mother   . Heart attack Father    Allergies  Allergen Reactions  . Aricept [Donepezil Hcl]   . Bactrim [Sulfamethoxazole-Trimethoprim]   . Flagyl [Metronidazole] Hives  . Lisinopril     hyperkalemia   Prior to Admission medications   Medication Sig Start Date End Date Taking? Authorizing Provider  aspirin 81 MG chewable tablet Chew 81 mg by mouth.   Yes [provider]  Calcium Carb-Cholecalciferol (CALCIUM 1000 + D PO) Take by mouth daily.   Yes [provider]  Cholecalciferol (VITAMIN D) 2000 UNITS CAPS Take 2,000 Units by mouth daily.   Yes [provider]  escitalopram (LEXAPRO) 10 MG tablet Take 1 tablet (10 mg total) by mouth daily. 08/28/16  Yes Susy Frizzle, MD  fish oil-omega-3 fatty acids 1000 MG capsule Take 1,200 mg by mouth daily.   Yes [provider]  glucose blood (ONE TOUCH ULTRA  TEST) test strip CHECK BLOOD SUGAR EVERYDAY IN THE MORNING DX - E11.9 07/19/16  Yes Susy Frizzle, MD  hydrOXYzine (ATARAX/VISTARIL) 10 MG tablet TAKE 1 TABLET BY MOUTH EVERY DAY 09/19/16  Yes Susy Frizzle, MD  JANUVIA 100 MG tablet TAKE 1 TABLET BY MOUTH EVERY DAY 05/22/17  Yes Susy Frizzle, MD  losartan (COZAAR) 100 MG tablet Take 1 tablet (100 mg total) by mouth daily. 02/10/16  Yes Susy Frizzle, MD  memantine (NAMENDA) 10 MG tablet TAKE 1 TABLET BY MOUTH TWICE A DAY 05/28/17  Yes Susy Frizzle, MD  Multiple Vitamin (MULTIVITAMIN) tablet Take 1 tablet by mouth daily.   Yes [provider]  Multiple Vitamins-Minerals (ICAPS AREDS 2 PO) Take 1 capsule by mouth daily.   Yes [provider]  omeprazole (PRILOSEC) 40 MG capsule TAKE ONE CAPSULE BY MOUTH EVERY MORNING 06/11/16  Yes Susy Frizzle,  MD  pravastatin (PRAVACHOL) 40 MG tablet TAKE 1 TABLET BY MOUTH AT BEDTIME 11/26/16  Yes Susy Frizzle, MD   Dg Chest Port 1 View  Result Date: 06/08/2017 CLINICAL DATA:  Initial preoperative evaluation for left hip fracture. EXAM: PORTABLE CHEST 1 VIEW COMPARISON:  Prior radiograph from 08/23/2010. FINDINGS: Cardiac and mediastinal silhouettes are stable in size and contour, and remain within normal limits. Lungs are hypoinflated. Secondary diffuse bronchovascular crowding. No focal infiltrates. No edema or effusion. No pneumothorax. No acute osseous abnormality. IMPRESSION: 1. Shallow lung inflation with secondary diffuse bronchovascular crowding. 2. No other active cardiopulmonary disease. Electronically Signed   By: Jeannine Boga M.D.   On: 06/08/2017 03:03   Dg Hip Unilat With Pelvis 2-3 Views Left  Result Date: 06/08/2017 CLINICAL DATA:  Left hip pain after fall. EXAM: DG HIP (WITH OR WITHOUT PELVIS) 2-3V LEFT COMPARISON:  None. FINDINGS: Acute left intertrochanteric fracture of the femur is noted with varus angulation as well as cephalad and lateral displacement of the femoral shaft relative to femoral head and neck. Intact bony pelvis. Intact right femur. Mild lumbar spondylosis with multilevel degenerative disc disease and dextroconvex curvature of the lumbar spine. Surgical clips project over the upper pelvis bilaterally. IMPRESSION: Acute displaced and varus angulated intertrochanteric fracture of the left femur. Dextroscoliosis with lumbar spondylosis. Electronically Signed   By: Ashley Royalty M.D.   On: 06/08/2017 02:11    Positive ROS: All other systems have been reviewed and were otherwise negative with the exception of those mentioned in the HPI and as above.  Labs cbc Recent Labs    06/08/17 0155  WBC 11.3*  HGB 13.0  HCT 39.3  PLT 248    Labs inflam No results for input(s): CRP in the last 72 hours.  Invalid input(s): ESR  Labs coag Recent Labs     06/08/17 0219  INR 1.02    Recent Labs    06/08/17 0155  NA 139  K 3.6  CL 101  CO2 24  GLUCOSE 139*  BUN 24*  CREATININE 1.09*  CALCIUM 9.3    Physical Exam: Vitals:   06/08/17 0639 06/08/17 0747  BP: 131/63 (!) 107/58  Pulse: 88 84  Resp: (!) 21 16  Temp:    SpO2: 90% 97%   General: Alert, no acute distress Cardiovascular: No pedal edema Respiratory: No cyanosis, no use of accessory musculature GI: No organomegaly, abdomen is soft and non-tender Skin: No lesions in the area of chief complaint other than those listed below in MSK exam.  Neurologic: Sensation intact distally save  for the below mentioned MSK exam Psychiatric: Patient is competent for consent with normal mood and affect Lymphatic: No axillary or cervical lymphadenopathy  MUSCULOSKELETAL:  LLE: compartments soft, NVI, pain with ROM at the hip Other extremities are atraumatic with painless ROM and NVI.  Assessment: L intertroch fracture  Plan: IM nail today WBAT post op   Renette Butters, MD Cell [phone]   06/08/2017 9:06 AM

## 2017-06-08 NOTE — Progress Notes (Signed)
  PROGRESS NOTE  [name]:[id number] DOB: [birth year] DOA: 06/08/2017 PCP: Susy Frizzle, MD  Brief Narrative: 77 year old woman PMH diabetes mellitus type 2, dementia, presented after mechanical fall at home resulting in left hip pain.  Admitted for left hip fracture.  Assessment/Plan Left hip fracture status post mechanical fall at home --Now status post surgery.  Management per orthopedics.  Diabetes mellitus type 2.  Resume Januvia on discharge. --Stable.  Continue sliding scale insulin.  Essential hypertension --Stable.  Hyperlipidemia --Continue pravastatin.  Dementia without behavioral disturbance --Appears stable.  Continue memantine.  DVT prophylaxis: Per orthopedics Code Status: Full Family Communication: none  Disposition Plan: pending    [name], MD  [facility] Direct contact: [phone] --Via amion app OR  --[email or web address]; password [email or web address]  7PM-7AM contact night coverage as above 06/08/2017, 1:50 PM  LOS: 0 days   Consultants:  The [facility]  Procedures:    Antimicrobials:    Interval history/Subjective: Feels cold at the moment.  Objective: Vitals:  Vitals:   06/08/17 1322 06/08/17 1327  BP:  109/68  Pulse: 77 74  Resp: (!) 23 14  Temp:    SpO2: 100% 100%    Exam:  Constitutional:  . Appears calm and comfortable Respiratory:  . CTA bilaterally, no w/r/r.  . Respiratory effort normal.  Cardiovascular:  . RRR, no m/r/g Psychiatric:  . Mental status o Mood, affect appropriate . Appears somewhat confused   I have personally reviewed the following:   Labs:  Blood sugars stable  Basic metabolic panel, hepatic function panel unremarkable  WBC 18.8, hemoglobin stable 12.7, platelets 191  Imaging studies:  Chest x-ray no acute disease  Scheduled Meds: . acetaminophen  500 mg Oral Q6H  . [MAR Hold] Calcium Carb-Cholecalciferol   Oral Daily  . chlorhexidine  60 mL Topical Once  . [MAR Hold]  escitalopram  10 mg Oral Daily  . [MAR Hold] fish oil-omega-3 fatty acids  1,000 mg Oral Daily  . [MAR Hold] hydrOXYzine  10 mg Oral Daily  . [MAR Hold] insulin aspart  0-9 Units Subcutaneous Q4H  . ketorolac  7.5 mg Intravenous Q6H  . [MAR Hold] losartan  100 mg Oral Daily  . [MAR Hold] memantine  10 mg Oral BID  . [MAR Hold] multivitamin  1 tablet Oral Daily  . [MAR Hold] pantoprazole  80 mg Oral Daily  . povidone-iodine  2 application Topical Once  . [MAR Hold] pravastatin  40 mg Oral QHS  . [MAR Hold] Vitamin D  2,000 Units Oral Daily   Continuous Infusions: .  ceFAZolin (ANCEF) IV    . lactated ringers    . lactated ringers    . methocarbamol (ROBAXIN)  IV      Principal Problem:   Closed left hip fracture, initial encounter ([place]) Active Problems:   Diabetes mellitus without complication ([place])   Hypertension   Hyperlipidemia   Mild dementia   LOS: 0 days

## 2017-06-08 NOTE — Anesthesia Preprocedure Evaluation (Addendum)
Anesthesia Evaluation  Patient identified by MRN, date of birth, ID band Patient awake    Reviewed: Allergy & Precautions, NPO status , Patient's Chart, lab work & pertinent test results  History of Anesthesia Complications Negative for: history of anesthetic complications  Airway Mallampati: II  TM Distance: >3 FB Neck ROM: Full    Dental  (+) Dental Advisory Given   Pulmonary neg pulmonary ROS,    breath sounds clear to auscultation       Cardiovascular hypertension, Pt. on medications  Rhythm:Regular + Systolic murmurs    Neuro/Psych PSYCHIATRIC DISORDERS Depression negative neurological ROS     GI/Hepatic Neg liver ROS, GERD  Medicated and Controlled,  Endo/Other  diabetes, Type 2  Renal/GU negative Renal ROS     Musculoskeletal   Abdominal   Peds  Hematology  (+) anemia ,   Anesthesia Other Findings SEM radiating to carotid concerning for AS, patient aware of murmur but unsure of origin  Reproductive/Obstetrics                            Anesthesia Physical Anesthesia Plan  ASA: III  Anesthesia Plan: MAC and Spinal   Post-op Pain Management:    Induction:   PONV Risk Score and Plan: 2 and Treatment may vary due to age or medical condition  Airway Management Planned: Nasal Cannula  Additional Equipment:   Intra-op Plan:   Post-operative Plan:   Informed Consent: I have reviewed the patients History and Physical, chart, labs and discussed the procedure including the risks, benefits and alternatives for the proposed anesthesia with the patient or authorized representative who has indicated his/her understanding and acceptance.   Dental advisory given  Plan Discussed with: CRNA and Surgeon  Anesthesia Plan Comments:         Anesthesia Quick Evaluation

## 2017-06-08 NOTE — H&P (Signed)
History and Physical    [name] HYI:[id number] DOB: [birth year] DOA: 06/08/2017  PCP: Susy Frizzle, MD  Patient coming from: Home  I have personally briefly reviewed patient's old medical records in [place]  Chief Complaint: L hip pain  HPI: [name] is a 77 y.o. female with medical history significant of DM2, HTN, mild dementia.  Patient presents to the ED with c/o L hip pain after slipping and falling.  Unable to bear weight.  Pain with any attempted movement.  Associated deformity.    ED Course: L hip fx.  Dr. Percell Miller wants patient over at [facility] for OR today.  Initially was going to put in [place] traction but patient currently is thankfully pain free as long as we dont move her hip, due to mild dementia though she doesn't even remember what she came in for and most of the history has to be taken from husband who is at bedside.  This memory difficulty is also baseline per husband.   Review of Systems: As per HPI otherwise 10 point review of systems negative.   Past Medical History:  Diagnosis Date  . Acquired vaginal enterocele 2018   s/p sacrospinal ligament fixation and posterior colporrhaphy Dr. Zigmund Daniel at [facility]  . Dementia    mild mmse 05/2016 (could not remember 3 objects, unable to perform serial 7's)  . Diabetes mellitus without complication ([place])   . Diverticulosis   . Hyperlipidemia   . Hypertension     Past Surgical History:  Procedure Laterality Date  . ABDOMINAL HYSTERECTOMY    . bladder tack    . CHOLECYSTECTOMY    . COLONOSCOPY N/A 05/18/2013   Procedure: COLONOSCOPY;  Surgeon: Wonda Horner, MD;  Location: [facility];  Service: Endoscopy;  Laterality: N/A;     reports that she has never smoked. She has never used smokeless tobacco. She reports that she drinks about 1.2 oz of alcohol per week. She reports that she does not use drugs.  Allergies  Allergen Reactions  . Aricept [Donepezil Hcl]   . Bactrim  [Sulfamethoxazole-Trimethoprim]   . Flagyl [Metronidazole] Hives  . Lisinopril     hyperkalemia    Family History  Problem Relation Age of Onset  . CAD Mother   . Heart attack Father      Prior to Admission medications   Medication Sig Start Date End Date Taking? Authorizing Provider  aspirin 81 MG chewable tablet Chew 81 mg by mouth.   Yes [provider]  Calcium Carb-Cholecalciferol (CALCIUM 1000 + D PO) Take by mouth daily.   Yes [provider]  Cholecalciferol (VITAMIN D) 2000 UNITS CAPS Take 2,000 Units by mouth daily.   Yes [provider]  escitalopram (LEXAPRO) 10 MG tablet Take 1 tablet (10 mg total) by mouth daily. 08/28/16  Yes Susy Frizzle, MD  fish oil-omega-3 fatty acids 1000 MG capsule Take 1,200 mg by mouth daily.   Yes [provider]  glucose blood (ONE TOUCH ULTRA TEST) test strip CHECK BLOOD SUGAR EVERYDAY IN THE MORNING DX - E11.9 07/19/16  Yes Susy Frizzle, MD  hydrOXYzine (ATARAX/VISTARIL) 10 MG tablet TAKE 1 TABLET BY MOUTH EVERY DAY 09/19/16  Yes Susy Frizzle, MD  JANUVIA 100 MG tablet TAKE 1 TABLET BY MOUTH EVERY DAY 05/22/17  Yes Susy Frizzle, MD  losartan (COZAAR) 100 MG tablet Take 1 tablet (100 mg total) by mouth daily. 02/10/16  Yes Susy Frizzle, MD  memantine [facility])  10 MG tablet TAKE 1 TABLET BY MOUTH TWICE A DAY 05/28/17  Yes Susy Frizzle, MD  Multiple Vitamin (MULTIVITAMIN) tablet Take 1 tablet by mouth daily.   Yes [provider]  Multiple Vitamins-Minerals (ICAPS AREDS 2 PO) Take 1 capsule by mouth daily.   Yes [provider]  omeprazole (PRILOSEC) 40 MG capsule TAKE ONE CAPSULE BY MOUTH EVERY MORNING 06/11/16  Yes Susy Frizzle, MD  pravastatin (PRAVACHOL) 40 MG tablet TAKE 1 TABLET BY MOUTH AT BEDTIME 11/26/16  Yes Susy Frizzle, MD    Physical Exam: Vitals:   06/08/17 0211 06/08/17 0252 06/08/17 0335 06/08/17 0436  BP: 134/71 (!) 131/114 133/72 135/74    Pulse: 71 81 90 89  Resp: 16 12 20 16   Temp:      TempSrc:      SpO2: 93% 96% 100% 99%  Weight:      Height:        Constitutional: NAD, calm, comfortable Eyes: PERRL, lids and conjunctivae normal ENMT: Mucous membranes are moist. Posterior pharynx clear of any exudate or lesions.Normal dentition.  Neck: normal, supple, no masses, no thyromegaly Respiratory: clear to auscultation bilaterally, no wheezing, no crackles. Normal respiratory effort. No accessory muscle use.  Cardiovascular: Regular rate and rhythm, no murmurs / rubs / gallops. No extremity edema. 2+ pedal pulses. No carotid bruits.  Abdomen: no tenderness, no masses palpated. No hepatosplenomegaly. Bowel sounds positive.  Musculoskeletal: L leg shortened externally rotated, intact sensation and DP pulse Skin: no rashes, lesions, ulcers. No induration Neurologic: CN 2-12 grossly intact. Sensation intact, DTR normal. Strength 5/5 in all 4.  Psychiatric: Normal judgment and insight. Alert and oriented x 3. Normal mood.    Labs on Admission: I have personally reviewed following labs and imaging studies  CBC: Recent Labs  Lab 06/08/17 0155  WBC 11.3*  NEUTROABS 8.8*  HGB 13.0  HCT 39.3  MCV 95.6  PLT 096   Basic Metabolic Panel: Recent Labs  Lab 06/08/17 0155  NA 139  K 3.6  CL 101  CO2 24  GLUCOSE 139*  BUN 24*  CREATININE 1.09*  CALCIUM 9.3   GFR: Estimated Creatinine Clearance: 44.1 mL/min (A) (by C-G formula based on SCr of 1.09 mg/dL (H)). Liver Function Tests: Recent Labs  Lab 06/08/17 0155  AST 35  ALT 24  ALKPHOS 80  BILITOT 0.9  PROT 7.4  ALBUMIN 4.1   No results for input(s): LIPASE, AMYLASE in the last 168 hours. No results for input(s): AMMONIA in the last 168 hours. Coagulation Profile: Recent Labs  Lab 06/08/17 0219  INR 1.02   Cardiac Enzymes: No results for input(s): CKTOTAL, CKMB, CKMBINDEX, TROPONINI in the last 168 hours. BNP (last 3 results) No results for input(s):  PROBNP in the last 8760 hours. HbA1C: No results for input(s): HGBA1C in the last 72 hours. CBG: No results for input(s): GLUCAP in the last 168 hours. Lipid Profile: No results for input(s): CHOL, HDL, LDLCALC, TRIG, CHOLHDL, LDLDIRECT in the last 72 hours. Thyroid Function Tests: No results for input(s): TSH, T4TOTAL, FREET4, T3FREE, THYROIDAB in the last 72 hours. Anemia Panel: No results for input(s): VITAMINB12, FOLATE, FERRITIN, TIBC, IRON, RETICCTPCT in the last 72 hours. Urine analysis:    Component Value Date/Time   COLORURINE RED (A) 07/02/2016 0932   APPEARANCEUR CLOUDY (A) 07/02/2016 0932   LABSPEC 1.025 07/02/2016 0932   PHURINE 6.0 07/02/2016 0932   GLUCOSEU NEGATIVE 07/02/2016 0932   HGBUR 3+ (A) 07/02/2016 0932  BILIRUBINUR NEGATIVE 07/02/2016 0932   KETONESUR TRACE (A) 07/02/2016 0932   PROTEINUR 3+ (A) 07/02/2016 0932   NITRITE POSITIVE (A) 07/02/2016 0932   LEUKOCYTESUR 3+ (A) 07/02/2016 0932    Radiological Exams on Admission: Dg Chest Port 1 View  Result Date: 06/08/2017 CLINICAL DATA:  Initial preoperative evaluation for left hip fracture. EXAM: PORTABLE CHEST 1 VIEW COMPARISON:  Prior radiograph from 08/23/2010. FINDINGS: Cardiac and mediastinal silhouettes are stable in size and contour, and remain within normal limits. Lungs are hypoinflated. Secondary diffuse bronchovascular crowding. No focal infiltrates. No edema or effusion. No pneumothorax. No acute osseous abnormality. IMPRESSION: 1. Shallow lung inflation with secondary diffuse bronchovascular crowding. 2. No other active cardiopulmonary disease. Electronically Signed   By: Jeannine Boga M.D.   On: 06/08/2017 03:03   Dg Hip Unilat With Pelvis 2-3 Views Left  Result Date: 06/08/2017 CLINICAL DATA:  Left hip pain after fall. EXAM: DG HIP (WITH OR WITHOUT PELVIS) 2-3V LEFT COMPARISON:  None. FINDINGS: Acute left intertrochanteric fracture of the femur is noted with varus angulation as well as  cephalad and lateral displacement of the femoral shaft relative to femoral head and neck. Intact bony pelvis. Intact right femur. Mild lumbar spondylosis with multilevel degenerative disc disease and dextroconvex curvature of the lumbar spine. Surgical clips project over the upper pelvis bilaterally. IMPRESSION: Acute displaced and varus angulated intertrochanteric fracture of the left femur. Dextroscoliosis with lumbar spondylosis. Electronically Signed   By: Ashley Royalty M.D.   On: 06/08/2017 02:11    EKG: Independently reviewed.  Assessment/Plan Principal Problem:   Closed left hip fracture, initial encounter [facility]) Active Problems:   Diabetes mellitus without complication (H[facility])   Hypertension   Mild dementia    1. Closed L hip fx - 1. Hip fx pathway 2. Morphine PRN pain control 3. Transfer to [facility] for surgery today 4. NPO for surgery 2. DM2 - 1. Hold home PO meds 2. Sensitive SSI Q4H while NPO 3. HTN - continue home meds 4. Mild dementia - chronic difficulty with memory, per husband this is baseline today.   DVT prophylaxis: SCDs - chemo PPx post op Code Status: Full Family Communication: Husband at bedside Disposition Plan: rehab / SNF after admit Consults called: Dr. Percell Miller Admission status: Admit to inpatient   [place] [place] [facility] Pager [phone]  If 7AM-7PM, please contact day team taking care of patient [email or web address] Password [facility]  06/08/2017, 4:58 AM

## 2017-06-08 NOTE — ED Notes (Signed)
X-ray at bedside

## 2017-06-08 NOTE — ED Triage Notes (Signed)
Patient arrives by [facility] with complaints of fall-standing up from couch and stumbled on a rug and fell-witnessed by husband-no LOC. EMS states left hip rotation and shortening and swelling to thigh. Patient given Fentanyl 100 mcg for pain. PMS intact.

## 2017-06-08 NOTE — ED Notes (Addendum)
CBG 110. 

## 2017-06-09 ENCOUNTER — Inpatient Hospital Stay (HOSPITAL_COMMUNITY): Payer: PPO

## 2017-06-09 ENCOUNTER — Encounter (HOSPITAL_COMMUNITY): Payer: Self-pay | Admitting: Family Medicine

## 2017-06-09 DIAGNOSIS — N183 Chronic kidney disease, stage 3 unspecified: Secondary | ICD-10-CM

## 2017-06-09 DIAGNOSIS — N179 Acute kidney failure, unspecified: Secondary | ICD-10-CM

## 2017-06-09 DIAGNOSIS — D62 Acute posthemorrhagic anemia: Secondary | ICD-10-CM

## 2017-06-09 HISTORY — DX: Chronic kidney disease, stage 3 unspecified: N18.30

## 2017-06-09 LAB — BASIC METABOLIC PANEL
ANION GAP: 9 (ref 5–15)
BUN: 25 mg/dL — ABNORMAL HIGH (ref 6–20)
CHLORIDE: 99 mmol/L — AB (ref 101–111)
CO2: 24 mmol/L (ref 22–32)
CREATININE: 1.53 mg/dL — AB (ref 0.44–1.00)
Calcium: 8.1 mg/dL — ABNORMAL LOW (ref 8.9–10.3)
GFR calc non Af Amer: 32 mL/min — ABNORMAL LOW (ref >60)
GFR, EST AFRICAN AMERICAN: 37 mL/min — AB (ref >60)
Glucose, Bld: 160 mg/dL — ABNORMAL HIGH (ref 65–99)
POTASSIUM: 4 mmol/L (ref 3.5–5.1)
SODIUM: 132 mmol/L — AB (ref 135–145)

## 2017-06-09 LAB — GLUCOSE, CAPILLARY
GLUCOSE-CAPILLARY: 167 mg/dL — AB (ref 65–99)
GLUCOSE-CAPILLARY: 187 mg/dL — AB (ref 65–99)
GLUCOSE-CAPILLARY: 127 mg/dL — AB (ref 65–99)
Glucose-Capillary: 154 mg/dL — ABNORMAL HIGH (ref 65–99)

## 2017-06-09 LAB — CBC
HCT: 29 % — ABNORMAL LOW (ref 36.0–46.0)
Hemoglobin: 9.5 g/dL — ABNORMAL LOW (ref 12.0–15.0)
MCH: 32 pg (ref 26.0–34.0)
MCHC: 32.8 g/dL (ref 30.0–36.0)
MCV: 97.6 fL (ref 78.0–100.0)
Platelets: 192 10*3/uL (ref 150–400)
RBC: 2.97 MIL/uL — AB (ref 3.87–5.11)
RDW: 12.8 % (ref 11.5–15.5)
WBC: 8.6 10*3/uL (ref 4.0–10.5)

## 2017-06-09 MED ORDER — POLYETHYLENE GLYCOL 3350 17 G PO PACK
17.0000 g | PACK | Freq: Every day | ORAL | Status: DC
  Administered 2017-06-11: 17 g via ORAL
  Filled 2017-06-09 (×2): qty 1

## 2017-06-09 NOTE — Progress Notes (Signed)
OT Cancellation Note  Patient Details Name: [name] MRN: [id number] DOB: [birth year]   Cancelled Treatment:    Reason Eval/Treat Not Completed: Patient declined, no reason specified. Pt confused, no family present - "I am not getting out of bed, I just got back in bed". OT will continue to follow for evaluation.  [facility] 06/09/2017, 6:07 PM  Hulda Humphrey OTR/L [phone]

## 2017-06-09 NOTE — Evaluation (Signed)
Physical Therapy Evaluation Patient Details Name: [name] MRN: [id number] DOB: May 23, 1940 Today's Date: 06/09/2017   History of Present Illness  Pt is a 77 y.o. female with medical history significant of DM2, HTN, and mild dementia.  Patient presented to the ED with c/o L hip pain after slipping and falling. Xray revealed L hip fx. She is now s/p IM nail.     Clinical Impression  Pt admitted with above diagnosis. Pt currently with functional limitations due to the deficits listed below (see PT Problem List). PTA pt lived at home with her husband independent with mobility. On eval, she required mod assist bed mobility, mod assist sit to stand and min assist ambulation 10 feet with RW. Gait distance limited by pain. Pt will benefit from skilled PT to increase their independence and safety with mobility to allow discharge to the venue listed below.  Pt with mild confusion on eval. Unsure of validity of information she provided. Will need to confirm with husband that pt has RW for home use. Pt's desire is to return home at d/c. Per MD note, pt has no steps to manage but pt reports 2 steps to enter house. If family unable to provide 24-hour assist and/or pt fails to progress with mobility, ST SNF will need to be considered.      Follow Up Recommendations Home health PT;Supervision/Assistance - 24 hour    Equipment Recommendations  3in1 (PT)    Recommendations for Other Services       Precautions / Restrictions Precautions Precautions: Fall Restrictions LLE Weight Bearing: Weight bearing as tolerated      Mobility  Bed Mobility Overal bed mobility: Needs Assistance Bed Mobility: Supine to Sit     Supine to sit: Mod assist;HOB elevated     General bed mobility comments: +rail, cues for sequencing, increased time and effort, use of bed pad to scoot to EOB  Transfers Overall transfer level: Needs assistance Equipment used: Rolling walker (2 wheeled) Transfers: Sit to/from  Stand Sit to Stand: Mod assist         General transfer comment: verbal cues for hand placement, assist to power up  Ambulation/Gait Ambulation/Gait assistance: Min assist Ambulation Distance (Feet): 10 Feet Assistive device: Rolling walker (2 wheeled) Gait Pattern/deviations: Step-to pattern;Decreased stride length;Trunk flexed;Decreased weight shift to left;Antalgic Gait velocity: decreased   General Gait Details: cues for sequencing and posture  Stairs            Wheelchair Mobility    Modified Rankin (Stroke Patients Only)       Balance Overall balance assessment: Needs assistance Sitting-balance support: Feet supported;Bilateral upper extremity supported Sitting balance-Leahy Scale: Fair     Standing balance support: Bilateral upper extremity supported;During functional activity Standing balance-Leahy Scale: Poor Standing balance comment: reliant on RW                             Pertinent Vitals/Pain Pain Assessment: Faces Faces Pain Scale: Hurts even more Pain Location: L hip with WB Pain Descriptors / Indicators: Grimacing;Guarding;Sore Pain Intervention(s): Limited activity within patient's tolerance;Repositioned;Ice applied;Monitored during session    [place] expects to be discharged to:: Private residence Living Arrangements: Spouse/significant other Available Help at Discharge: Family;Available 24 hours/day Type of Home: House Home Access: Stairs to enter   [employer] of Steps: 2 Home Layout: Two level;Able to live on main level with bedroom/bathroom Home Equipment: Gilford Rile - 2 wheels;Shower seat  Prior Function Level of Independence: Independent         Comments: Unsure of validity of above information provided by the patient. H/o mild dementia and presenting with mild confusion on eval.      Hand Dominance   Dominant Hand: Right    Extremity/Trunk Assessment   Upper Extremity  Assessment Upper Extremity Assessment: Defer to OT evaluation    Lower Extremity Assessment Lower Extremity Assessment: LLE deficits/detail LLE Deficits / Details: s/p IM nail L hip LLE: Unable to fully assess due to pain    Cervical / Trunk Assessment Cervical / Trunk Assessment: Normal  Communication   Communication: No difficulties  Cognition Arousal/Alertness: Awake/alert Behavior During Therapy: WFL for tasks assessed/performed Overall Cognitive Status: No family/caregiver present to determine baseline cognitive functioning                                 General Comments: A&O x 3. Mild confusion. Answers questions appropriately. Cues to stay on task. Repeatedly telling same stories/information. Mild dementia at baseline.      General Comments      Exercises General Exercises - Lower Extremity Ankle Circles/Pumps: AROM;Both;10 reps Quad Sets: AROM;Both;10 reps   Assessment/Plan    PT Assessment Patient needs continued PT services  PT Problem List Decreased mobility;Decreased safety awareness;Decreased knowledge of precautions;Decreased activity tolerance;Decreased cognition;Pain;Decreased knowledge of use of DME;Decreased balance       PT Treatment Interventions DME instruction;Therapeutic activities;Cognitive remediation;Gait training;Therapeutic exercise;Patient/family education;Stair training;Balance training;Functional mobility training    PT Goals (Current goals can be found in the Care Plan section)  Acute Rehab PT Goals Patient Stated Goal: home PT Goal Formulation: With patient Time For Goal Achievement: 06/23/17 Potential to Achieve Goals: Good    Frequency Min 5X/week   Barriers to discharge        Co-evaluation               AM-PAC PT "6 Clicks" Daily Activity  Outcome Measure Difficulty turning over in bed (including adjusting bedclothes, sheets and blankets)?: Unable Difficulty moving from lying on back to sitting on the  side of the bed? : Unable Difficulty sitting down on and standing up from a chair with arms (e.g., wheelchair, bedside commode, etc,.)?: Unable Help needed moving to and from a bed to chair (including a wheelchair)?: A Little Help needed walking in hospital room?: A Little Help needed climbing 3-5 steps with a railing? : A Lot 6 Click Score: 11    End of Session Equipment Utilized During Treatment: Gait belt Activity Tolerance: Patient tolerated treatment well Patient left: in chair;with call bell/phone within reach Nurse Communication: Mobility status PT Visit Diagnosis: Other abnormalities of gait and mobility (R26.89);Difficulty in walking, not elsewhere classified (R26.2);Pain Pain - Right/Left: Left Pain - part of body: Hip    Time: 0902-0923 PT Time Calculation (min) (ACUTE ONLY): 21 min   Charges:   PT Evaluation $PT Eval Moderate Complexity: 1 Mod     PT G Codes:        Lorrin Goodell, PT  Office # [phone] Pager [phone]   Lorriane Shire 06/09/2017, 9:40 AM

## 2017-06-09 NOTE — Progress Notes (Addendum)
PROGRESS NOTE  [name] QQV:[id number] DOB: [birth year] DOA: 06/08/2017 PCP: Susy Frizzle, MD  Brief Narrative: 77 year old woman PMH diabetes mellitus type 2, dementia, presented after mechanical fall at home resulting in left hip pain.  Admitted for left hip fracture.  Assessment/Plan Left hip fracture status post mechanical fall at home Per orthopedics:  Weightbearing: WBAT LLE  Incision and dressing care:  maintain clean Mepilex until follow-up  Showering: Keep dressing and incisions dry  VTE prophylaxis: Lovenox 40mg  qd 30 days, SCDs, ambulation  Pain control: Norco as needed.  Minimize narcotics due to history of dementia.  Tylenol preferred.  Follow - up plan: 2 weeks  Acute blood loss anemia secondary to surgery --Asymptomatic.  Trend hemoglobin.  CBC in a.m.  No indication for transfusion at this point.  Hemodynamic stable.  AKI superimposed on CKD stage III --BUN elevated, sodium and chloride low.  Likely secondary to volume shifts, dehydration. --Strict I/O, IV fluids, repeat BMP in a.m. --Hold losartan  Diabetes mellitus type 2.  Resume Januvia on discharge. --CBG remains stable.  Continue sliding scale insulin.  Essential hypertension --Stable.  Will hold losartan as above.  Hyperlipidemia --Continue pravastatin.  Dementia without behavioral disturbance --Stable.  Continue memantine.  DVT prophylaxis: Per orthopedics Code Status: Full Family Communication: none  Disposition Plan: pending    Olivia Hodgkins, MD  [facility] Direct contact: [phone] --Via amion app OR  --[email or web address]; password [email or web address]  7PM-7AM contact night coverage as above 06/09/2017, 10:11 AM  LOS: 1 day   Consultants:  [facility]  Procedures:  5/18 INTRAMEDULLARY (IM) NAIL LEFT HIP  Antimicrobials:    Interval history/Subjective: Feels well. No pain. No nausea or vomiting. Breathing fine.  Objective: Vitals:  Vitals:   06/09/17 0106 06/09/17 0400   BP: (!) 102/45 107/64  Pulse: (!) 114 (!) 106  Resp: 16 14  Temp: 98.8 F (37.1 C) 99.4 F (37.4 C)  SpO2: 90% 99%    Exam: Constitutional:   . Appears calm and comfortable Respiratory:  . CTA bilaterally, 2/6 systolic murmur.  No rub or gallop. Marland Kitchen Respiratory effort normal.  Cardiovascular:  . RRR, no m/r/g Psychiatric:  . Mental status o Mood, affect appropriate  I have personally reviewed the following:   Labs:  Blood sugars stable  Creatinine 1.09 >> 1.53.  BUN elevated, sodium and chloride slightly low.  Hemoglobin 12.7 >> 9.5  Scheduled Meds: . acetaminophen  500 mg Oral Q6H  . calcium-vitamin D  1 tablet Oral Q breakfast  . cholecalciferol  2,000 Units Oral Daily  . docusate sodium  100 mg Oral BID  . enoxaparin (LOVENOX) injection  40 mg Subcutaneous Q24H  . escitalopram  10 mg Oral Daily  . hydrOXYzine  10 mg Oral Daily  . insulin aspart  0-9 Units Subcutaneous Q4H  . ketorolac  7.5 mg Intravenous Q6H  . losartan  100 mg Oral Daily  . memantine  10 mg Oral BID  . multivitamin with minerals  1 tablet Oral Daily  . omega-3 acid ethyl esters  1 g Oral Daily  . pantoprazole  80 mg Oral Daily  . pravastatin  40 mg Oral QHS  . simethicone  160 mg Oral Once   Continuous Infusions: . lactated ringers    . lactated ringers    . methocarbamol (ROBAXIN)  IV      Principal Problem:   Closed left hip fracture, initial encounter [facility]) Active Problems:   Diabetes mellitus without complication ([place])   Hypertension  Hyperlipidemia   Mild dementia   LOS: 1 day

## 2017-06-09 NOTE — Plan of Care (Signed)
  Problem: Activity: Goal: Risk for activity intolerance will decrease Outcome: Progressing   Problem: Pain Managment: Goal: General experience of comfort will improve Outcome: Progressing   

## 2017-06-09 NOTE — Op Note (Signed)
DATE OF SURGERY:  06/09/2017  TIME: 8:52 AM  PATIENT NAME:  [name]  AGE: 77 y.o.  PRE-OPERATIVE DIAGNOSIS:  LEFT HIP FRACTURE  POST-OPERATIVE DIAGNOSIS:  SAME  PROCEDURE:  INTRAMEDULLARY (IM) NAIL LEFT HIP  SURGEON:  Kimesha Claxton D  ASSISTANT:  Roxan Hockey, PA-C, he was present and scrubbed throughout the case, critical for completion in a timely fashion, and for retraction, instrumentation, and closure.   OPERATIVE IMPLANTS: [employer] Gamma Nail  PREOPERATIVE INDICATIONS:  [name] is a 77 y.o. year old who fell and suffered a hip fracture. She was brought into the ER and then admitted and optimized and then elected for surgical intervention.    The risks benefits and alternatives were discussed with the patient including but not limited to the risks of nonoperative treatment, versus surgical intervention including infection, bleeding, nerve injury, malunion, nonunion, hardware prominence, hardware failure, need for hardware removal, blood clots, cardiopulmonary complications, morbidity, mortality, among others, and they were willing to proceed.    OPERATIVE PROCEDURE:  The patient was brought to the operating room and placed in the supine position. General anesthesia was administered. She was placed on the fracture table.  Closed reduction was performed under C-arm guidance. Time out was then performed after sterile prep and drape. She received preoperative antibiotics.  Incision was made proximal to the greater trochanter. A guidewire was placed in the appropriate position. Confirmation was made on AP and lateral views. The above-named nail was opened. I opened the proximal femur with a reamer. I then placed the nail by hand easily down. I did not need to ream the femur.  Once the nail was completely seated, I placed a guidepin into the femoral head into the center center position. I measured the length, and then reamed the lateral cortex and up into the head. I  then placed the lag screw. Slight compression was applied. Anatomic fixation achieved. Bone quality was mediocre.  I then secured the proximal interlocking bolt, and took off a half a turn, and then removed the instruments, and took final C-arm pictures AP and lateral the entire length of the leg.   Anatomic reconstruction was achieved, and the wounds were irrigated copiously and closed with Vicryl followed by staples and sterile gauze for the skin. The patient was awakened and returned to PACU in stable and satisfactory condition. There no complications and the patient tolerated the procedure well.  She will be weightbearing as tolerated, and will be on chemical px  for a period of four weeks after discharge.   Edmonia Lynch, M.D.

## 2017-06-09 NOTE — Progress Notes (Signed)
    Subjective: Patient reports pain as mild.  Tolerating diet.  Urinating.   No CP, SOB.  No weakness or dizziness.  Says that she has been up out of bed to the bathroom without pain.    Objective:   VITALS:   Vitals:   06/08/17 1507 06/08/17 2130 06/09/17 0106 06/09/17 0400  BP: (!) 116/59 (!) 105/59 (!) 102/45 107/64  Pulse: 96 (!) 107 (!) 114 (!) 106  Resp: 18 18 16 14   Temp: 97.7 F (36.5 C) 98.7 F (37.1 C) 98.8 F (37.1 C) 99.4 F (37.4 C)  TempSrc: Oral Oral Oral Oral  SpO2: 96% 95% 90% 99%  Weight:      Height:       CBC Latest Ref Rng & Units 06/08/2017 06/08/2017 06/21/2016  WBC 4.0 - 10.5 K/uL 18.8(H) 11.3(H) 7.7  Hemoglobin 12.0 - 15.0 g/dL 12.7 13.0 13.7  Hematocrit 36.0 - 46.0 % 38.0 39.3 41.8  Platelets 150 - 400 K/uL 191 248 245   BMP Latest Ref Rng & Units 06/08/2017 06/08/2017 06/21/2016  Glucose 65 - 99 mg/dL - 139(H) 98  BUN 6 - 20 mg/dL - 24(H) 26(H)  Creatinine 0.44 - 1.00 mg/dL 1.16(H) 1.09(H) 1.21(H)  Sodium 135 - 145 mmol/L - 139 137  Potassium 3.5 - 5.1 mmol/L - 3.6 4.0  Chloride 101 - 111 mmol/L - 101 103  CO2 22 - 32 mmol/L - 24 22  Calcium 8.9 - 10.3 mg/dL - 9.3 9.2   Intake/Output      05/18 0701 - 05/19 0700 05/19 0701 - 05/20 0700   P.O. 460    I.V. (mL/kg) 600 (7.4)    Other 100    Total Intake(mL/kg) 1160 (14.2)    Urine (mL/kg/hr) 1 (0)    Stool 1    Blood 400    Total Output 402    Net +758         Urine Occurrence 2 x       Physical Exam: General: NAD.  Upright in bed on arrival.  Calm, an conversant.  No increased work of breathing.  MSK LLE: Neurovascularly intact Sensation intact distally Feet warm Dorsiflexion/Plantar flexion intact Incision: dressing C/D/I   Assessment: 1 Day Post-Op  S/P Procedure(s) (LRB): INTRAMEDULLARY (IM) NAIL LEFT HIP (Left) by Dr. Ernesta Amble. Percell Miller on 06/08/2017  Principal Problem:   Closed left hip fracture, initial encounter [facility]) Active Problems:   Diabetes mellitus without  complication ([place])   Hypertension   Hyperlipidemia   Mild dementia   Left intertrochanteric femur fracture, status post cephalo-medullary IM nail Doing well Pain controlled Patient states she has been mobilizing in the room without pain   Plan: Up with therapy Incentive Spirometry Apply ice as needed  Weightbearing: WBAT LLE Insicional and dressing care:  maintain clean Mepilex until follow-up Showering: Keep dressing and incisions dry VTE prophylaxis: Lovenox 40mg  qd 30 days, SCDs, ambulation Pain control: Norco as needed.  Minimize narcotics due to history of dementia.  Tylenol preferred. Follow - up plan: 2 weeks Contact information:  Edmonia Lynch MD, Roxan Hockey PA-C  Dispo: Per primary.  Therapy evaluations pending.  Stable from an orthopedic perspective.  She reports mobilizing already without pain.  She lives with her husband and does not have stairs to negotiate in her home.   Charna Elizabeth Martensen III, PA-C 06/09/2017, 7:19 AM

## 2017-06-10 ENCOUNTER — Encounter (HOSPITAL_COMMUNITY): Payer: Self-pay | Admitting: Orthopedic Surgery

## 2017-06-10 ENCOUNTER — Other Ambulatory Visit: Payer: Self-pay | Admitting: Family Medicine

## 2017-06-10 LAB — BASIC METABOLIC PANEL
Anion gap: 10 (ref 5–15)
BUN: 20 mg/dL (ref 6–20)
CALCIUM: 8.9 mg/dL (ref 8.9–10.3)
CO2: 25 mmol/L (ref 22–32)
Chloride: 104 mmol/L (ref 101–111)
Creatinine, Ser: 1.3 mg/dL — ABNORMAL HIGH (ref 0.44–1.00)
GFR, EST AFRICAN AMERICAN: 45 mL/min — AB (ref >60)
GFR, EST NON AFRICAN AMERICAN: 39 mL/min — AB (ref >60)
Glucose, Bld: 152 mg/dL — ABNORMAL HIGH (ref 65–99)
Potassium: 4.2 mmol/L (ref 3.5–5.1)
Sodium: 139 mmol/L (ref 135–145)

## 2017-06-10 LAB — GLUCOSE, CAPILLARY
GLUCOSE-CAPILLARY: 142 mg/dL — AB (ref 65–99)
GLUCOSE-CAPILLARY: 142 mg/dL — AB (ref 65–99)
GLUCOSE-CAPILLARY: 158 mg/dL — AB (ref 65–99)
GLUCOSE-CAPILLARY: 179 mg/dL — AB (ref 65–99)
Glucose-Capillary: 148 mg/dL — ABNORMAL HIGH (ref 65–99)
Glucose-Capillary: 130 mg/dL — ABNORMAL HIGH (ref 65–99)

## 2017-06-10 LAB — CBC
HEMATOCRIT: 25.5 % — AB (ref 36.0–46.0)
HEMOGLOBIN: 8.4 g/dL — AB (ref 12.0–15.0)
MCH: 31.7 pg (ref 26.0–34.0)
MCHC: 32.9 g/dL (ref 30.0–36.0)
MCV: 96.2 fL (ref 78.0–100.0)
Platelets: 165 10*3/uL (ref 150–400)
RBC: 2.65 MIL/uL — AB (ref 3.87–5.11)
RDW: 12.3 % (ref 11.5–15.5)
WBC: 9.8 10*3/uL (ref 4.0–10.5)

## 2017-06-10 MED ORDER — LACTATED RINGERS IV SOLN
INTRAVENOUS | Status: DC
  Administered 2017-06-10: 10:00:00 via INTRAVENOUS

## 2017-06-10 NOTE — Progress Notes (Signed)
06/10/17 1500  PT Visit Information  Last PT Received On 06/10/17  Assistance Needed +1  History of Present Illness Pt is a 77 y.o. female with medical history significant of DM2, HTN, and mild dementia.  Patient presented to the ED with c/o L hip pain after slipping and falling. Xray revealed L hip fx. She is now s/p IM nail.   Subjective Data  Patient Stated Goal go home  Precautions  Precautions Fall  Restrictions  Weight Bearing Restrictions Yes  LLE Weight Bearing WBAT  Pain Assessment  Pain Assessment Faces  Faces Pain Scale 6  Pain Location L hip with WB  Pain Descriptors / Indicators Grimacing;Guarding;Sore  Pain Intervention(s) Monitored during session;Limited activity within patient's tolerance  Cognition  Arousal/Alertness Lethargic  Behavior During Therapy WFL for tasks assessed/performed  Overall Cognitive Status Impaired/Different from baseline  Area of Impairment Orientation;Attention;Memory;Following commands;Awareness  Orientation Level Disoriented to;Situation;Time;Place  Current Attention Level Selective  Memory Decreased short-term memory  Following Commands Follows one step commands inconsistently  Awareness Emergent  General Comments History of dementia at baseline. Alert and oriented to self today. Frequently repeting the same questions and unable to remember conversations from 10 min ago. Pt unable to recall anything from this morning and difficulty recalling names of friends arriving to visit during session.  Bed Mobility  Overal bed mobility Needs Assistance  General bed mobility comments in chair on arrival  Transfers  Overall transfer level Needs assistance  Equipment used Rolling walker (2 wheeled)  Transfers Sit to/from Stand  Sit to Stand Min assist  General transfer comment min A to steady during transfer. Cues for hand placement.  Ambulation/Gait  Ambulation/Gait assistance Min assist  Ambulation Distance (Feet) 5 Feet (23ft x2)  Assistive  device Rolling walker (2 wheeled)  Gait Pattern/deviations Step-to pattern;Decreased stride length;Trunk flexed;Decreased weight shift to left;Antalgic;Decreased step length - right;Decreased stance time - left  General Gait Details Pt with difficulty with weight shift to LLE secondary to pain. Heavy reliance on RW to offload LE. Chair close behind as pt fatigues quickly. Seated rest break inbetween two gait trails of 5 ft each.  Gait velocity decreased  Gait velocity interpretation <1.31 ft/sec, indicative of household ambulator  Balance  Overall balance assessment Needs assistance  Sitting-balance support Feet supported;Bilateral upper extremity supported  Sitting balance-Leahy Scale Fair  Standing balance support Bilateral upper extremity supported;During functional activity  Standing balance-Leahy Scale Poor  Standing balance comment reliant on RW  PT - End of Session  Equipment Utilized During Treatment Gait belt  Activity Tolerance Patient limited by pain  Patient left in chair;with call bell/phone within reach;with family/visitor present  Nurse Communication Mobility status   PT - Assessment/Plan  PT Plan Discharge plan needs to be updated  PT Visit Diagnosis Other abnormalities of gait and mobility (R26.89);Difficulty in walking, not elsewhere classified (R26.2);Pain  Pain - Right/Left Left  Pain - part of body Hip  PT Frequency (ACUTE ONLY) Min 5X/week  Follow Up Recommendations SNF  PT equipment 3in1 (PT)  AM-PAC PT "6 Clicks" Daily Activity Outcome Measure  Difficulty turning over in bed (including adjusting bedclothes, sheets and blankets)? 1  Difficulty moving from lying on back to sitting on the side of the bed?  1  Difficulty sitting down on and standing up from a chair with arms (e.g., wheelchair, bedside commode, etc,.)? 1  Help needed moving to and from a bed to chair (including a wheelchair)? 3  Help needed walking in hospital room? 3  Help needed climbing 3-5 steps  with a railing?  2  6 Click Score 11  Mobility G Code  CL  PT Goal Progression  Progress towards PT goals Progressing toward goals  Acute Rehab PT Goals  PT Goal Formulation With patient  Time For Goal Achievement 06/23/17  Potential to Achieve Goals Good  PT Time Calculation  PT Start Time (ACUTE ONLY) 1405  PT Stop Time (ACUTE ONLY) 1441  PT Time Calculation (min) (ACUTE ONLY) 36 min  PT General Charges  $$ ACUTE PT VISIT 1 Visit  PT Treatments  $Gait Training 23-37 mins   Today's treatment currently limited by pain and lethargy. Pt asleep in chair on arrival to room. On arousing, pt very confused and disoriented to time/place/situation. Pt able to ambulate 5 ft x2 with seated rest break in between. Pt with heavy reliance on RW for stability and to offload LLE. D/c plan updated to SNF considering pt's current level of assist and activity tolerance.  Will continue to follow acutely to maximize pt's functional independence and safety with mobility.   Benjiman Core, PTA Pager [phone] Acute Rehab

## 2017-06-10 NOTE — Progress Notes (Signed)
PROGRESS NOTE  [name] CXK:[id number] DOB: 1941/01/02 DOA: 06/08/2017 PCP: Susy Frizzle, MD  Brief Narrative: 77 year old woman PMH diabetes mellitus type 2, dementia, presented after mechanical fall at home resulting in left hip pain.  Admitted for left hip fracture.  Assessment/Plan Left hip fracture status post mechanical fall at home Per orthopedics:  Weightbearing: WBAT LLE  Incision and dressing care:  maintain clean Mepilex until follow-up  Showering: Keep dressing and incisions dry  VTE prophylaxis: Lovenox 40mg  qd 30 days, SCDs, ambulation  Pain control: Norco as needed.  Minimize narcotics due to history of dementia.  Tylenol preferred.  Follow - up plan: 2 weeks  Acute blood loss anemia secondary to surgery --Remains asymptomatic.  Hemoglobin has trended down somewhat further but this may be related to dilution from IV fluids.  Hemodynamics remained stable. --Check CBC in a.m.  AKI superimposed on CKD stage III --Secondary to perioperative fluid shifts, dehydration.  Improving with IV fluids.  Losartan on hold. --BMP in a.m.  Diabetes mellitus type 2.  Resume Januvia on discharge. --CBG remains stable.  Continue sliding scale insulin.  Essential hypertension --Remains stable.  Will hold losartan as above.  Hyperlipidemia --Continue pravastatin.  Dementia without behavioral disturbance --Remains stable.  Continue memantine.  DVT prophylaxis: Per orthopedics Code Status: Full Family Communication: none  Disposition Plan: pending    Olivia Hodgkins, MD  [facility] Direct contact: [phone] --Via amion app OR  --[email or web address]; password [email or web address]  7PM-7AM contact night coverage as above 06/10/2017, 3:30 PM  LOS: 2 days   Consultants:  Orthopedics  Procedures:  5/18 INTRAMEDULLARY (IM) NAIL LEFT HIP  Antimicrobials:    Interval history/Subjective: Feels fine. No nausea. No CP or SOB.  Objective: Vitals:  Vitals:   06/10/17 0435 06/10/17 1527  BP: (!) 130/58 (!) 125/58  Pulse: (!) 116 99  Resp: 16 16  Temp: 99.5 F (37.5 C) 98.6 F (37 C)  SpO2: 94% 94%    Exam: Constitutional:   . Appears calm and comfortable Eyes:  . pupils and irises appear normal . Normal lids  ENMT:  . grossly normal hearing  . Lips appear normal Respiratory:  . CTA bilaterally, no w/r/r.  . Respiratory effort normal. Cardiovascular:  . RRR, no m/r/g . No LE extremity edema   Abdomen:  . no tenderness or masses Skin:  . No rashes, lesions, ulcers . palpation of skin: no induration or nodules Psychiatric:  . Mental status o Mood, affect appropriate  I have personally reviewed the following:   Labs:  Blood sugars stable  Creatinine 1.09 >> 1.53 >> 1.30.  BUN decreased, now WNL  Hemoglobin 12.7 >> 9.5 >> 8.4  Scheduled Meds: . calcium-vitamin D  1 tablet Oral Q breakfast  . cholecalciferol  2,000 Units Oral Daily  . enoxaparin (LOVENOX) injection  40 mg Subcutaneous Q24H  . escitalopram  10 mg Oral Daily  . hydrOXYzine  10 mg Oral Daily  . insulin aspart  0-9 Units Subcutaneous Q4H  . memantine  10 mg Oral BID  . multivitamin with minerals  1 tablet Oral Daily  . omega-3 acid ethyl esters  1 g Oral Daily  . pantoprazole  80 mg Oral Daily  . polyethylene glycol  17 g Oral Daily  . pravastatin  40 mg Oral QHS  . simethicone  160 mg Oral Once   Continuous Infusions: . lactated ringers 100 mL/hr at 06/10/17 0956  . methocarbamol (ROBAXIN)  IV      Principal Problem:  Closed left hip fracture, initial encounter [facility]) Active Problems:   Diabetes mellitus without complication ([place])   Hypertension   Hyperlipidemia   Acute blood loss anemia   Mild dementia   AKI (acute kidney injury) ([place])   CKD (chronic kidney disease), stage III ([place])   LOS: 2 days

## 2017-06-10 NOTE — Evaluation (Signed)
Occupational Therapy Evaluation Patient Details Name: [name] MRN: [id number] DOB: 1940/09/13 Today's Date: 06/10/2017    History of Present Illness Pt is a 77 y.o. female with medical history significant of DM2, HTN, and mild dementia.  Patient presented to the ED with c/o L hip pain after slipping and falling. Xray revealed L hip fx. She is now s/p IM nail.    Clinical Impression   PTA, pt reports independence with basic ADL. She does have a history of dementia at baseline and her husband was present throughout session. He reports she has a RW from a recent previous injury as well. Pt currently requiring total assist for LB ADL, mod assist +2 for simulated stand-pivot toilet transfers, and min assist for UB ADL. Pt oriented to self only today and with difficulty following commands and sustaining attention at times. Pt would benefit from continued OT services while admitted to improve independence and safety with ADL and functional mobility. Recommend short-term SNF placement for continued rehabilitation prior to returning home with her husband.     Follow Up Recommendations  SNF;Supervision/Assistance - 24 hour    Equipment Recommendations  Other (comment)(TBD at next venue of care)    Recommendations for Other Services       Precautions / Restrictions Precautions Precautions: Fall Restrictions Weight Bearing Restrictions: Yes LLE Weight Bearing: Weight bearing as tolerated      Mobility Bed Mobility Overal bed mobility: Needs Assistance Bed Mobility: Supine to Sit     Supine to sit: Mod assist;HOB elevated     General bed mobility comments: Assist primarily for scooting hips to EOB, raising trunk from [place], and management of LLE  Transfers Overall transfer level: Needs assistance Equipment used: Rolling walker (2 wheeled) Transfers: Sit to/from Stand Sit to Stand: Mod assist         General transfer comment: Mod assist to power up to standing. Assist to  weight shift to pivot on R foot and attempt to take a step.     Balance Overall balance assessment: Needs assistance Sitting-balance support: Feet supported;Bilateral upper extremity supported Sitting balance-Leahy Scale: Fair     Standing balance support: Bilateral upper extremity supported;During functional activity Standing balance-Leahy Scale: Poor Standing balance comment: reliant on RW                           ADL either performed or assessed with clinical judgement   ADL Overall ADL's : Needs assistance/impaired Eating/Feeding: Supervision/ safety;Sitting   Grooming: Supervision/safety;Sitting   Upper Body Bathing: Minimal assistance;Sitting   Lower Body Bathing: Total assistance;Sit to/from stand   Upper Body Dressing : Minimal assistance;Sitting   Lower Body Dressing: Total assistance;Sit to/from stand   Toilet Transfer: Moderate assistance;Stand-pivot;RW Toilet Transfer Details (indicate cue type and reason): Heavy mod assist for stability throughout transfer.  Toileting- Clothing Manipulation and Hygiene: Total assistance;Sit to/from stand       Functional mobility during ADLs: Moderate assistance;Rolling walker(stand-pivot only) General ADL Comments: Pt with poor orientation and memory impacting her ability to participate in ADL tasks. She is limited significantly by fear of falling and pain.      Vision Patient Visual Report: No change from baseline Vision Assessment?: No apparent visual deficits     Perception     Praxis      Pertinent Vitals/Pain Pain Assessment: Faces Faces Pain Scale: Hurts even more Pain Location: L hip with WB Pain Descriptors / Indicators: Grimacing;Guarding;Sore Pain Intervention(s): Limited activity  within patient's tolerance;Monitored during session;Repositioned     Hand Dominance Right   Extremity/Trunk Assessment Upper Extremity Assessment Upper Extremity Assessment: Overall WFL for tasks assessed    Lower Extremity Assessment Lower Extremity Assessment: LLE deficits/detail LLE Deficits / Details: Decrease strength and ROM as expected post-operatively.  LLE: Unable to fully assess due to pain   Cervical / Trunk Assessment Cervical / Trunk Assessment: Normal   Communication Communication Communication: No difficulties   Cognition Arousal/Alertness: Awake/alert Behavior During Therapy: WFL for tasks assessed/performed Overall Cognitive Status: Impaired/Different from baseline Area of Impairment: Orientation;Attention;Memory;Following commands;Awareness                 Orientation Level: Disoriented to;Situation;Time;Place Current Attention Level: Selective Memory: Decreased short-term memory Following Commands: Follows one step commands inconsistently   Awareness: Emergent   General Comments: History of dementia at baseline. Alert and oriented to self only today. Thought the night nurse had taken her to a basement.    General Comments       Exercises     Shoulder Instructions      Home Living Family/patient expects to be discharged to:: Private residence Living Arrangements: Spouse/significant other Available Help at Discharge: Family;Available 24 hours/day Type of Home: House Home Access: Stairs to enter [employer] of Steps: 2   Home Layout: Two level;Able to live on main level with bedroom/bathroom     Bathroom Shower/Tub: Occupational psychologist: Standard     Home Equipment: Environmental consultant - 2 wheels;Shower seat          Prior Functioning/Environment Level of Independence: Independent        Comments: Pt reports independence. Husband present during session and engaged and did not dispute this.         OT Problem List: Decreased strength;Decreased range of motion;Decreased activity tolerance;Impaired balance (sitting and/or standing);Decreased safety awareness;Decreased knowledge of use of DME or AE;Decreased knowledge of  precautions;Decreased cognition;Pain      OT Treatment/Interventions: Self-care/ADL training;Therapeutic exercise;DME and/or AE instruction;Energy conservation;Therapeutic activities;Patient/family education;Balance training;Cognitive remediation/compensation    OT Goals(Current goals can be found in the care plan section) Acute Rehab OT Goals Patient Stated Goal: go home OT Goal Formulation: With patient/family Time For Goal Achievement: 06/24/17 Potential to Achieve Goals: Good ADL Goals Pt Will Perform Grooming: with min assist;standing Pt Will Perform Lower Body Dressing: with min assist;sit to/from stand Pt Will Transfer to Toilet: with min assist;ambulating;bedside commode Pt Will Perform Toileting - Clothing Manipulation and hygiene: with min assist;sit to/from stand  OT Frequency: Min 2X/week   Barriers to D/C:            Co-evaluation              AM-PAC PT "6 Clicks" Daily Activity     Outcome Measure Help from another person eating meals?: A Little Help from another person taking care of personal grooming?: A Little Help from another person toileting, which includes using toliet, bedpan, or urinal?: A Lot Help from another person bathing (including washing, rinsing, drying)?: A Lot Help from another person to put on and taking off regular upper body clothing?: A Little Help from another person to put on and taking off regular lower body clothing?: Total 6 Click Score: 14   End of Session Equipment Utilized During Treatment: Gait belt;Rolling walker Nurse Communication: Mobility status  Activity Tolerance: Patient tolerated treatment well Patient left: in chair;with call bell/phone within reach;with family/visitor present  OT Visit Diagnosis: Other abnormalities of gait and mobility (  R26.89);Pain Pain - Right/Left: Left Pain - part of body: Hip                Time: 1210-1230 OT Time Calculation (min): 20 min Charges:  OT General Charges $OT Visit: 1  Visit OT Evaluation $OT Eval Moderate Complexity: 1 Mod G-Codes:     Norman Herrlich, MS OTR/L  Pager: [name] 06/10/2017, 1:21 PM

## 2017-06-10 NOTE — Progress Notes (Signed)
    Subjective: Patient reports no pain.  Tolerating diet.  No weakness or dizziness.  Only oriented to self.  She wants to get in touch with her husband.  Objective:   VITALS:   Vitals:   06/09/17 0106 06/09/17 0400 06/09/17 1924 06/10/17 0435  BP: (!) 102/45 107/64 (!) 109/59 (!) 130/58  Pulse: (!) 114 (!) 106 (!) 114 (!) 116  Resp: 16 14 16 16   Temp: 98.8 F (37.1 C) 99.4 F (37.4 C) 98.9 F (37.2 C) 99.5 F (37.5 C)  TempSrc: Oral Oral Oral Oral  SpO2: 90% 99% 96% 94%  Weight:      Height:       CBC Latest Ref Rng & Units 06/10/2017 06/09/2017 06/08/2017  WBC 4.0 - 10.5 K/uL 9.8 8.6 18.8(H)  Hemoglobin 12.0 - 15.0 g/dL 8.4(L) 9.5(L) 12.7  Hematocrit 36.0 - 46.0 % 25.5(L) 29.0(L) 38.0  Platelets 150 - 400 K/uL 165 192 191   BMP Latest Ref Rng & Units 06/09/2017 06/08/2017 06/08/2017  Glucose 65 - 99 mg/dL 160(H) - 139(H)  BUN 6 - 20 mg/dL 25(H) - 24(H)  Creatinine 0.44 - 1.00 mg/dL 1.53(H) 1.16(H) 1.09(H)  Sodium 135 - 145 mmol/L 132(L) - 139  Potassium 3.5 - 5.1 mmol/L 4.0 - 3.6  Chloride 101 - 111 mmol/L 99(L) - 101  CO2 22 - 32 mmol/L 24 - 24  Calcium 8.9 - 10.3 mg/dL 8.1(L) - 9.3   Intake/Output      05/19 0701 - 05/20 0700   P.O. 490   I.V. (mL/kg) 154 (1.9)   IV Piggyback 0   Total Intake(mL/kg) 644 (7.9)   Net +644       Urine Occurrence 5 x   Stool Occurrence 1 x      Physical Exam: General: NAD.  Upright in bed on arrival.  Calm, an conversant.  No increased work of breathing.  MSK LLE: Neurovascularly intact Sensation intact distally Feet warm Dorsiflexion/Plantar flexion intact Incision: dressing C/D/I   Assessment: 2 Days Post-Op  S/P Procedure(s) (LRB): INTRAMEDULLARY (IM) NAIL LEFT HIP (Left) by Dr. Ernesta Amble. Percell Miller on 06/08/2017  Principal Problem:   Closed left hip fracture, initial encounter [facility]) Active Problems:   Diabetes mellitus without complication ([place])   Hypertension   Hyperlipidemia   Acute blood loss anemia   Mild  dementia   AKI (acute kidney injury) ([place])   CKD (chronic kidney disease), stage III ([facility])   Left intertrochanteric femur fracture, status post cephalo-medullary IM nail Pain controlled Confused.   Therapy evaluations ongoing.    Plan: Up with therapy Incentive Spirometry Apply ice as needed  Weightbearing: WBAT LLE Insicional and dressing care:  maintain clean Mepilex until follow-up Showering: Keep dressing and incisions dry VTE prophylaxis: Lovenox 40mg  qd 30 days, SCDs, ambulation Pain control: Norco as needed.  Minimize narcotics due to history of dementia.  Tylenol preferred. Follow - up plan: 2 weeks Contact information:  Edmonia Lynch MD, Roxan Hockey PA-C Dispo: Per primary.  Therapy evaluations ongoing.      Prudencio Burly III, PA-C 06/10/2017, 6:47 AM

## 2017-06-10 NOTE — Social Work (Signed)
CSW faxed signed 30-day note and clinicals to [facility] to obtain PASSR for placement.  CSW f/u.  Elissa Hefty, LCSW Clinical Social Worker [phone]

## 2017-06-10 NOTE — Plan of Care (Signed)
  Problem: Education: Goal: Knowledge of General Education information will improve Outcome: Progressing   

## 2017-06-10 NOTE — Social Work (Signed)
PASSR pending for SNF placement. 30 day note on chart to be signed by doctor.  CSW will f/u.  Elissa Hefty, LCSW Clinical Social Worker [phone]

## 2017-06-10 NOTE — Clinical Social Work Note (Addendum)
Clinical Social Work Assessment  Patient Details  Name: [name] MRN: [id number] Date of Birth: Nov 10, 1940  Date of referral:  06/10/17               Reason for consult:  Facility Placement                Permission sought to share information with:  Chartered certified accountant granted to share information::  Yes, Verbal Permission Granted  Name::     Teacher, music::  SNF  Relationship::  spouse  Contact Information:     Housing/Transportation Living arrangements for the past 2 months:  Single Family Home Source of Information:  Patient, Spouse Patient Interpreter Needed:  None Criminal Activity/Legal Involvement Pertinent to Current Situation/Hospitalization:  No - Comment as needed Significant Relationships:  Other Family Members, Adult Children, Spouse Lives with:  Spouse Do you feel safe going back to the place where you live?  No Need for family participation in patient care:  Yes (Comment)  Care giving concerns:  Pt from home with spouse and will need skilled nursing.  Social Worker assessment / plan:  CSW met with spouse and patient at bedside. Pt reports that she was independent with ADL's and used walker briefly due to prior impairment. Spouse indicated that he can not proved 24 hour care for her and identified that she will get better care at [facility]. CSW explained role, SNF process and placement. Pt has experience with SNF in the past. CSW obtained permission to send to SNF's in [employer] Pt interested in [place]. CSW will f/u. CSW explained Insurance Auth needed for placement. CSW will f/u for disposition.  Employment status:  Retired Nurse, adult PT Recommendations:  [place] / Referral to community resources:  [place]  Patient/Family's Response to care:  Patient/family thanked CSW for meeting to discuss disposition. Pt agreeable to SNF.   Patient/Family's  Understanding of and Emotional Response to Diagnosis, Current Treatment, and Prognosis:  Patient/spouse has good understanding of diagnosis and are agreeable to SNF. Pt/Spouse indicated that if they do not get a good SNF placement, they will take patient home. CSW validated. Pt appears to be in good emotional state and engaged and smiled during assessment. No issues or concerns identified. Pt will return home with spouse once rehabilitative therapies completed.  Emotional Assessment Appearance:  Appears stated age Attitude/Demeanor/Rapport:  (Cooperative) Affect (typically observed):  Accepting, Appropriate Orientation:  Oriented to Self, Oriented to Situation, Oriented to Place Alcohol / Substance use:  Not Applicable Psych involvement (Current and /or in the community):  No (Comment)  Discharge Needs  Concerns to be addressed:  Discharge Planning Concerns Readmission within the last 30 days:  No Current discharge risk:  Dependent with Mobility, Physical Impairment Barriers to Discharge:  No Barriers Identified   Normajean Baxter, LCSW 06/10/2017, 11:09 AM

## 2017-06-10 NOTE — NC FL2 (Signed)
[place] LEVEL OF CARE SCREENING TOOL     IDENTIFICATION  Patient Name: [name] Birthdate: December 18, 1940 Sex: female Admission Date (Current Location): 06/08/2017  [facility] and [place] Number:  Herbalist and Address:  The . [facility], [place] [address], [place], [place] [address]      Provider Number: [id number]  Attending Physician Name and Address:  Samuella Cota, MD  Relative Name and Phone Number:  [facility], spouse    Current Level of Care: Hospital Recommended Level of Care: [place] Prior Approval Number:    Date Approved/Denied:   PASRR Number: pending  Discharge Plan: SNF    Current Diagnoses: Patient Active Problem List   Diagnosis Date Noted  . AKI (acute kidney injury) ([place]) 06/09/2017  . CKD (chronic kidney disease), stage III ([place]) 06/09/2017  . Closed left hip fracture, initial encounter ([place]) 06/08/2017  . Mild dementia 10/11/2016  . Situational depression 10/11/2016  . Osteopenia 05/11/2014  . Acute blood loss anemia 07/29/2013  . Dizzy spells 07/29/2013  . Lower GI bleed 05/14/2013  . GI bleed 05/14/2013  . Diabetes mellitus without complication ([place])   . Hypertension   . Hyperlipidemia   . Diverticulosis     Orientation RESPIRATION BLADDER Height & Weight     Self, Situation, Place  Normal Continent Weight: 180 lb (81.6 kg) Height:  5\' 2"  (157.5 cm)  BEHAVIORAL SYMPTOMS/MOOD NEUROLOGICAL BOWEL NUTRITION STATUS  (memory)   Continent Diet(See DC Summary)  AMBULATORY STATUS COMMUNICATION OF NEEDS Skin   Limited Assist(mod assist with transfers, bed ) Verbally Surgical wounds                       Personal Care Assistance Level of Assistance  Bathing, Feeding, Dressing Bathing Assistance: Maximum assistance Feeding assistance: Limited assistance Dressing Assistance: Maximum assistance     Functional Limitations Info  Sight, Hearing, Speech Sight Info:  Adequate Hearing Info: Adequate Speech Info: Adequate    SPECIAL CARE FACTORS FREQUENCY  PT (By licensed PT), OT (By licensed OT)     PT Frequency: 5x week OT Frequency: 5x week            Contractures      Additional Factors Info  Code Status, Allergies, Insulin Sliding Scale, Psychotropic Code Status Info: Full Allergies Info: ARICEPT DONEPEZIL HCL, BACTRIM SULFAMETHOXAZOLE-TRIMETHOPRIM, FLAGYL METRONIDAZOLE, LISINOPRIL  Psychotropic Info: Lexapro Insulin Sliding Scale Info: Insulin daily       Current Medications (06/10/2017):  This is the current hospital active medication list Current Facility-Administered Medications  Medication Dose Route Frequency Provider Last Rate Last Dose  . acetaminophen (TYLENOL) tablet 325-650 mg  325-650 mg Oral Q6H PRN Prudencio Burly III, PA-C   650 mg at 06/10/17 0439  . bisacodyl (DULCOLAX) suppository 10 mg  10 mg Rectal Daily PRN Prudencio Burly III, PA-C      . calcium-vitamin D (OSCAL WITH D) 500-200 MG-UNIT per tablet 1 tablet  1 tablet Oral Q breakfast Samuella Cota, MD   1 tablet at 06/10/17 [phone]  . cholecalciferol (VITAMIN D) tablet 2,000 Units  2,000 Units Oral Daily Samuella Cota, MD   2,000 Units at 06/10/17 [phone]  . enoxaparin (LOVENOX) injection 40 mg  40 mg Subcutaneous Q24H Prudencio Burly III, PA-C   40 mg at 06/10/17 1696  . escitalopram (LEXAPRO) tablet 10 mg  10 mg Oral Daily Prudencio Burly III, PA-C   10  mg at 06/10/17 0951  . HYDROcodone-acetaminophen (NORCO/VICODIN) 5-325 MG per tablet 1-2 tablet  1-2 tablet Oral Q4H PRN Prudencio Burly III, PA-C   1 tablet at 06/10/17 1145  . hydrOXYzine (ATARAX/VISTARIL) tablet 10 mg  10 mg Oral Daily Prudencio Burly III, PA-C   10 mg at 06/10/17 0951  . insulin aspart (novoLOG) injection 0-9 Units  0-9 Units Subcutaneous Q4H Prudencio Burly III, PA-C   1 Units at 06/10/17 1145  . lactated ringers infusion   Intravenous  Continuous Samuella Cota, MD 100 mL/hr at 06/10/17 [phone]    . memantine (NAMENDA) tablet 10 mg  10 mg Oral BID Prudencio Burly III, PA-C   10 mg at 06/10/17 6440  . methocarbamol (ROBAXIN) tablet 500 mg  500 mg Oral Q6H PRN Prudencio Burly III, PA-C   500 mg at 06/10/17 3474   Or  . methocarbamol (ROBAXIN) 500 mg in dextrose 5 % 50 mL IVPB  500 mg Intravenous Q6H PRN Prudencio Burly III, PA-C      . multivitamin with minerals tablet 1 tablet  1 tablet Oral Daily Prudencio Burly III, PA-C   1 tablet at 06/10/17 0951  . omega-3 acid ethyl esters (LOVAZA) capsule 1 g  1 g Oral Daily Samuella Cota, MD   1 g at 06/10/17 0951  . ondansetron (ZOFRAN) tablet 4 mg  4 mg Oral Q6H PRN Prudencio Burly III, PA-C       Or  . ondansetron [facility]) injection 4 mg  4 mg Intravenous Q6H PRN Prudencio Burly III, PA-C      . pantoprazole (PROTONIX) EC tablet 80 mg  80 mg Oral Daily Prudencio Burly III, PA-C   80 mg at 06/10/17 2595  . polyethylene glycol (MIRALAX / GLYCOLAX) packet 17 g  17 g Oral Daily Samuella Cota, MD      . pravastatin (PRAVACHOL) tablet 40 mg  40 mg Oral QHS Prudencio Burly III, PA-C   40 mg at 06/09/17 2100  . simethicone (MYLICON) chewable tablet 160 mg  160 mg Oral Once Gardiner Barefoot, NP      . sodium phosphate (FLEET) 7-19 GM/118ML enema 1 enema  1 enema Rectal Once PRN Prudencio Burly III, PA-C         Discharge Medications: Please see discharge summary for a list of discharge medications.  Relevant Imaging Results:  Relevant Lab Results:   Additional Information SS#: [id number]  [place], LCSW

## 2017-06-11 LAB — BASIC METABOLIC PANEL
Anion gap: 6 (ref 5–15)
BUN: 14 mg/dL (ref 6–20)
CHLORIDE: 106 mmol/L (ref 101–111)
CO2: 28 mmol/L (ref 22–32)
CREATININE: 1.04 mg/dL — AB (ref 0.44–1.00)
Calcium: 8.6 mg/dL — ABNORMAL LOW (ref 8.9–10.3)
GFR calc Af Amer: 59 mL/min — ABNORMAL LOW (ref >60)
GFR calc non Af Amer: 51 mL/min — ABNORMAL LOW (ref >60)
Glucose, Bld: 127 mg/dL — ABNORMAL HIGH (ref 65–99)
POTASSIUM: 4 mmol/L (ref 3.5–5.1)
Sodium: 140 mmol/L (ref 135–145)

## 2017-06-11 LAB — CBC
HCT: 23.6 % — ABNORMAL LOW (ref 36.0–46.0)
HEMOGLOBIN: 7.9 g/dL — AB (ref 12.0–15.0)
MCH: 31.9 pg (ref 26.0–34.0)
MCHC: 33.5 g/dL (ref 30.0–36.0)
MCV: 95.2 fL (ref 78.0–100.0)
Platelets: 209 10*3/uL (ref 150–400)
RBC: 2.48 MIL/uL — AB (ref 3.87–5.11)
RDW: 12.2 % (ref 11.5–15.5)
WBC: 10.3 10*3/uL (ref 4.0–10.5)

## 2017-06-11 LAB — GLUCOSE, CAPILLARY
GLUCOSE-CAPILLARY: 130 mg/dL — AB (ref 65–99)
GLUCOSE-CAPILLARY: 160 mg/dL — AB (ref 65–99)
Glucose-Capillary: 128 mg/dL — ABNORMAL HIGH (ref 65–99)
Glucose-Capillary: 120 mg/dL — ABNORMAL HIGH (ref 65–99)
Glucose-Capillary: 150 mg/dL — ABNORMAL HIGH (ref 65–99)

## 2017-06-11 MED ORDER — POLYETHYLENE GLYCOL 3350 17 G PO PACK
17.0000 g | PACK | Freq: Two times a day (BID) | ORAL | Status: DC
  Administered 2017-06-11 – 2017-06-13 (×4): 17 g via ORAL
  Filled 2017-06-11 (×4): qty 1

## 2017-06-11 MED ORDER — SENNA 8.6 MG PO TABS
1.0000 | ORAL_TABLET | Freq: Every day | ORAL | Status: DC
  Administered 2017-06-11 – 2017-06-12 (×2): 8.6 mg via ORAL
  Filled 2017-06-11 (×2): qty 1

## 2017-06-11 NOTE — Social Work (Signed)
CSW called admissions staff at [facility] and left message for Angela Nevin in admissions.  CSW will f/u for SNF placement.  Elissa Hefty, LCSW Clinical Social Worker [phone]

## 2017-06-11 NOTE — Progress Notes (Signed)
PROGRESS NOTE  [name] OIN:[id number] DOB: Apr 24, 1940 DOA: 06/08/2017 PCP: Susy Frizzle, MD  Brief Narrative: 77 year old woman PMH diabetes mellitus type 2, dementia, presented after mechanical fall at home resulting in left hip pain.  Admitted for left hip fracture.  Underwent operative fixation with only complication being perioperative blood loss and mild AKI subsequently resolved.  Plan for skilled nursing facility 5/22 with hemoglobin remained stable.  Assessment/Plan Left hip fracture status post mechanical fall at home Per orthopedics:  Weightbearing: WBAT LLE  Incision and dressing care:  maintain clean Mepilex until follow-up  Showering: Keep dressing and incisions dry  VTE prophylaxis: Lovenox 40mg  qd 30 days, SCDs, ambulation  Pain control: Norco as needed.  Minimize narcotics due to history of dementia.  Tylenol preferred.  Follow - up plan: 2 weeks  Acute blood loss anemia secondary to surgery --Asymptomatic.  Hemoglobin probably not significantly different compared to yesterday.  Doubt ongoing bleeding. --Follow clinically.  Check CBC in a.m.  If stable, no further evaluation suggested.  AKI superimposed on CKD stage III --Secondary to perioperative fluid shifts, dehydration.  Resolved with IV fluids.  Losartan on hold. --Discontinue IV fluids.  Diabetes mellitus type 2.  Resume Januvia on discharge. --CBG remains stable.  Continue sliding scale insulin.  Essential hypertension --Remains stable.  Will hold losartan as above.  Hyperlipidemia --Continue pravastatin.  Dementia without behavioral disturbance --Remains stable.  Continue memantine.  DVT prophylaxis: Enoxaparin Code Status: Full Family Communication: none  Disposition Plan: snf   [name], MD  [facility] Direct contact: [phone] --Via amion app OR  --[email or web address]; password [email or web address]  7PM-7AM contact night coverage as above 06/11/2017, 1:57 PM  LOS: 3 days    Consultants:  Orthopedics  Procedures:  5/18 INTRAMEDULLARY (IM) NAIL LEFT HIP  Antimicrobials:    Interval history/Subjective: Has constipation otherwise doing well.  No pain.  Objective: Vitals:  Vitals:   06/11/17 0625 06/11/17 0858  BP: 140/69 133/65  Pulse: 100 (!) 110  Resp: 18 16  Temp: 99.1 F (37.3 C) 99.2 F (37.3 C)  SpO2: 92% 98%    Exam:  Constitutional:   . Appears calm and comfortable Respiratory:  . CTA bilaterally, no w/r/r.  . Respiratory effort normal.  Cardiovascular:  . RRR, no m/r/g Psychiatric:  . Mental status o Mood, affect appropriate  I have personally reviewed the following:   Labs:  Blood sugars remain stable.  Creatinine 1.09 >> 1.53 >> 1.30 >> 1.04  Remainder BMP unremarkable  Hemoglobin 12.7 >> 9.5 >> 8.4 >> 7.9  Scheduled Meds: . calcium-vitamin D  1 tablet Oral Q breakfast  . cholecalciferol  2,000 Units Oral Daily  . enoxaparin (LOVENOX) injection  40 mg Subcutaneous Q24H  . escitalopram  10 mg Oral Daily  . hydrOXYzine  10 mg Oral Daily  . insulin aspart  0-9 Units Subcutaneous Q4H  . memantine  10 mg Oral BID  . multivitamin with minerals  1 tablet Oral Daily  . omega-3 acid ethyl esters  1 g Oral Daily  . pantoprazole  80 mg Oral Daily  . polyethylene glycol  17 g Oral BID  . pravastatin  40 mg Oral QHS  . senna  1 tablet Oral QHS  . simethicone  160 mg Oral Once   Continuous Infusions: . methocarbamol (ROBAXIN)  IV      Principal Problem:   Closed left hip fracture, initial encounter [facility]) Active Problems:   Diabetes mellitus without complication ([place])   Hypertension  Hyperlipidemia   Acute blood loss anemia   Mild dementia   AKI (acute kidney injury) ([place])   CKD (chronic kidney disease), stage III ([place])   LOS: 3 days

## 2017-06-11 NOTE — Social Work (Addendum)
CSW received HOOIL#[id number] for SNF placement.  CSW will discuss offers with pt and f/u for Insurance auth for placement.  CSW called [facility] and left message with [place]. CSW will f/u.  CSW also left message for Sharyn Lull at [facility].  CSW will continue to follow up for placement.   10:22am: CSW received call back from [place] at [facility] confirming receipt of [place], [place] Social Worker [phone]

## 2017-06-11 NOTE — Progress Notes (Addendum)
Physical Therapy Treatment Patient Details Name: [name] MRN: [id number] DOB: 03-27-1940 Today's Date: 06/11/2017    History of Present Illness Pt is a 77 y.o. female with medical history significant of DM2, HTN, and mild dementia.  Patient presented to the ED with c/o L hip pain after slipping and falling. Xray revealed L hip fx. She is now s/p IM nail.     PT Comments    Pt presenting with apparent improvement in mentation this session compared to notes from prior visits (not familiar with patient). Pt pleasant, conversational, participatory, following complex verbal cues for performance of exercises with additional cuing needed at times. Strength for exercises is good, requiring some physical assistance with less than 25% of exercises, but able to supine bridge 10x with ~50-75% ROM. Noted substantial echymosis, at the posterolateral hip, most of which is close in color to blueberry juice. Pt progressing well overall toward goals. Noted decline in Hb since surgery 12.7 to 7.9 today, with inverse relationship to HR, now tachycardic this date: will need to monitor more closely when OOB in future sessions.      Follow Up Recommendations  SNF     Equipment Recommendations  3in1 (PT)    Recommendations for Other Services       Precautions / Restrictions Precautions Precautions: Fall Restrictions LLE Weight Bearing: Weight bearing as tolerated    Mobility  Bed Mobility Overal bed mobility: (pt recently back to bed with nursing, transfers not pursued this session. )                Transfers                    Ambulation/Gait                 Stairs             Wheelchair Mobility    Modified Rankin (Stroke Patients Only)       Balance                                            Cognition Arousal/Alertness: Awake/alert Behavior During Therapy: WFL for tasks assessed/performed Overall Cognitive Status: Within Functional  Limits for tasks assessed                                        Exercises Total Joint Exercises Bridges: AROM;Strengthening;Both;10 reps;Supine General Exercises - Lower Extremity Ankle Circles/Pumps: AROM;Both;20 reps;Supine Short Arc Quad: AROM;Supine;Left;15 reps Heel Slides: AAROM;Supine;Left;10 reps Straight Leg Raises: AAROM;Supine;Left;15 reps Mini-Sqauts: Supine;Strengthening;Left;15 reps(manually resisted )    General Comments        Pertinent Vitals/Pain Pain Assessment: 0-10 Pain Score: 6  Pain Location: L hip with heel slides to end range flexion, bridging, Pain Descriptors / Indicators: Grimacing;Guarding;Sore Pain Intervention(s): Limited activity within patient's tolerance;Monitored during session;Patient requesting pain meds-RN notified;Premedicated before session;Repositioned    Home Living                      Prior Function            PT Goals (current goals can now be found in the care plan section) Acute Rehab PT Goals Patient Stated Goal: go home PT Goal Formulation: With patient Time For Goal Achievement: 06/23/17  Potential to Achieve Goals: Poor Progress towards PT goals: Progressing toward goals    Frequency    Min 3X/week      PT Plan Discharge plan needs to be updated    Co-evaluation              AM-PAC PT "6 Clicks" Daily Activity  Outcome Measure  Difficulty turning over in bed (including adjusting bedclothes, sheets and blankets)?: Unable Difficulty moving from lying on back to sitting on the side of the bed? : Unable Difficulty sitting down on and standing up from a chair with arms (e.g., wheelchair, bedside commode, etc,.)?: Unable Help needed moving to and from a bed to chair (including a wheelchair)?: A Little Help needed walking in hospital room?: A Little Help needed climbing 3-5 steps with a railing? : A Lot 6 Click Score: 11    End of Session   Activity Tolerance: Patient tolerated  treatment well;No increased pain;Patient limited by pain Patient left: in bed;with nursing/sitter in room;with call bell/phone within reach;with bed alarm set;with SCD's reapplied Nurse Communication: Mobility status PT Visit Diagnosis: Other abnormalities of gait and mobility (R26.89);Difficulty in walking, not elsewhere classified (R26.2);Pain Pain - Right/Left: Left Pain - part of body: Hip     Time: 1453-1511 PT Time Calculation (min) (ACUTE ONLY): 18 min  Charges:  $Therapeutic Exercise: 8-22 mins                    G Codes:       3:22 PM, 07-02-17 Etta Grandchild, PT, DPT Physical Therapist - [place] [phone] (Pager)  [phone] (Office)     Breleigh Carpino C July 02, 2017, 3:17 PM

## 2017-06-11 NOTE — Progress Notes (Signed)
    Subjective: Patient reports no pain now, but pain reported w/ mobilization by nursing and PT.   Only oriented to self.    Objective:   VITALS:   Vitals:   06/10/17 0435 06/10/17 1527 06/10/17 2035 06/11/17 0625  BP: (!) 130/58 (!) 125/58 138/79 140/69  Pulse: (!) 116 99 (!) 103 100  Resp: 16 16 18 18   Temp: 99.5 F (37.5 C) 98.6 F (37 C) 99.5 F (37.5 C) 99.1 F (37.3 C)  TempSrc: Oral Oral Oral Oral  SpO2: 94% 94% (!) 65% 92%  Weight:      Height:       CBC Latest Ref Rng & Units 06/10/2017 06/09/2017 06/08/2017  WBC 4.0 - 10.5 K/uL 9.8 8.6 18.8(H)  Hemoglobin 12.0 - 15.0 g/dL 8.4(L) 9.5(L) 12.7  Hematocrit 36.0 - 46.0 % 25.5(L) 29.0(L) 38.0  Platelets 150 - 400 K/uL 165 192 191   BMP Latest Ref Rng & Units 06/10/2017 06/09/2017 06/08/2017  Glucose 65 - 99 mg/dL 152(H) 160(H) -  BUN 6 - 20 mg/dL 20 25(H) -  Creatinine 0.44 - 1.00 mg/dL 1.30(H) 1.53(H) 1.16(H)  Sodium 135 - 145 mmol/L 139 132(L) -  Potassium 3.5 - 5.1 mmol/L 4.2 4.0 -  Chloride 101 - 111 mmol/L 104 99(L) -  CO2 22 - 32 mmol/L 25 24 -  Calcium 8.9 - 10.3 mg/dL 8.9 8.1(L) -   Intake/Output      05/20 0701 - 05/21 0700   P.O. 720   Total Intake(mL/kg) 720 (8.8)   Urine (mL/kg/hr) 2650 (1.4)   Total Output 2650   Net -1930       Urine Occurrence 1 x      Physical Exam: General: NAD.  Supine in bed on arrival.  Calm, an conversant.  No increased work of breathing.  MSK LLE: Neurovascularly intact Sensation intact distally Feet warm Dorsiflexion/Plantar flexion intact Incision: dressing C/D/I   Assessment: 3 Days Post-Op  S/P Procedure(s) (LRB): INTRAMEDULLARY (IM) NAIL LEFT HIP (Left) by Dr. Ernesta Amble. Percell Miller on 06/08/2017  Principal Problem:   Closed left hip fracture, initial encounter [facility]) Active Problems:   Diabetes mellitus without complication ([place])   Hypertension   Hyperlipidemia   Acute blood loss anemia   Mild dementia   AKI (acute kidney injury) ([place])   CKD (chronic  kidney disease), stage III ([facility])   Left intertrochanteric femur fracture, status post cephalo-medullary IM nail Pain controlled Confused.   Therapy evaluations ongoing - current recommendation is SNF  Plan: Up with therapy Incentive Spirometry Apply ice as needed  Weightbearing: WBAT LLE Insicional and dressing care:  maintain clean Mepilex until follow-up Showering: Keep dressing and incisions dry VTE prophylaxis: Lovenox 40mg  qd 30 days, SCDs, ambulation Pain control: Norco as needed.  Minimize narcotics due to history of dementia.  Tylenol preferred. Contact information:  Edmonia Lynch MD, Roxan Hockey PA-C Dispo: Per primary.  Therapy evaluations ongoing.      Follow - up plan: Follow up in the office with Dr. Alain Marion in 2 weeks.  Please call with questions.   Prudencio Burly III, PA-C 06/11/2017, 6:44 AM

## 2017-06-11 NOTE — Plan of Care (Signed)
  Problem: Education: Goal: Knowledge of General Education information will improve Outcome: Progressing   Problem: Health Behavior/Discharge Planning: Goal: Ability to manage health-related needs will improve Outcome: Progressing   Problem: Clinical Measurements: Goal: Will remain free from infection Outcome: Progressing   Problem: Activity: Goal: Risk for activity intolerance will decrease Outcome: Progressing   Problem: Elimination: Goal: Will not experience complications related to bowel motility Outcome: Progressing   Problem: Pain Managment: Goal: General experience of comfort will improve Outcome: Progressing

## 2017-06-12 DIAGNOSIS — S72002A Fracture of unspecified part of neck of left femur, initial encounter for closed fracture: Secondary | ICD-10-CM

## 2017-06-12 LAB — CBC
HCT: 26.2 % — ABNORMAL LOW (ref 36.0–46.0)
HEMOGLOBIN: 8.4 g/dL — AB (ref 12.0–15.0)
MCH: 30.5 pg (ref 26.0–34.0)
MCHC: 32.1 g/dL (ref 30.0–36.0)
MCV: 95.3 fL (ref 78.0–100.0)
PLATELETS: 284 10*3/uL (ref 150–400)
RBC: 2.75 MIL/uL — AB (ref 3.87–5.11)
RDW: 12.4 % (ref 11.5–15.5)
WBC: 9.9 10*3/uL (ref 4.0–10.5)

## 2017-06-12 LAB — GLUCOSE, CAPILLARY
GLUCOSE-CAPILLARY: 117 mg/dL — AB (ref 65–99)
GLUCOSE-CAPILLARY: 140 mg/dL — AB (ref 65–99)
GLUCOSE-CAPILLARY: 113 mg/dL — AB (ref 65–99)
GLUCOSE-CAPILLARY: 160 mg/dL — AB (ref 65–99)
Glucose-Capillary: 105 mg/dL — ABNORMAL HIGH (ref 65–99)
Glucose-Capillary: 122 mg/dL — ABNORMAL HIGH (ref 65–99)

## 2017-06-12 LAB — OCCULT BLOOD X 1 CARD TO LAB, STOOL: Fecal Occult Bld: NEGATIVE

## 2017-06-12 MED ORDER — SENNA 8.6 MG PO TABS: 1.0000 | ORAL_TABLET | Freq: Every day | ORAL | 0 refills | Status: DC

## 2017-06-12 MED ORDER — POLYETHYLENE GLYCOL 3350 17 G PO PACK: 17.0000 g | PACK | Freq: Two times a day (BID) | ORAL | 0 refills | Status: DC

## 2017-06-12 MED ORDER — OMEPRAZOLE 40 MG PO CPDR: 40.0000 mg | DELAYED_RELEASE_CAPSULE | Freq: Every morning | ORAL | 3 refills | Status: DC

## 2017-06-12 MED ORDER — FERROUS SULFATE 325 (65 FE) MG PO TABS
325.0000 mg | ORAL_TABLET | Freq: Two times a day (BID) | ORAL | Status: DC
  Administered 2017-06-12 – 2017-06-13 (×2): 325 mg via ORAL
  Filled 2017-06-12 (×2): qty 1

## 2017-06-12 MED ORDER — ENOXAPARIN SODIUM 40 MG/0.4ML ~~LOC~~ SOLN: 40.0000 mg | SUBCUTANEOUS | 0 refills | Status: DC

## 2017-06-12 MED ORDER — OMEGA-3-ACID ETHYL ESTERS 1 G PO CAPS: 1.0000 g | ORAL_CAPSULE | Freq: Every day | ORAL | 0 refills | Status: DC

## 2017-06-12 MED ORDER — METHOCARBAMOL 500 MG PO TABS: 500.0000 mg | ORAL_TABLET | Freq: Four times a day (QID) | ORAL | 0 refills | Status: DC | PRN

## 2017-06-12 NOTE — Social Work (Signed)
CSW met with patient at bedside to discuss SNF offers. Pt indicated spouse will be at hospital shortly.  CSW then called spouse to discuss SNF offers. Spouse indicated that if he did not get in [facility], he would be interested in [facility]. CSW called and left message for April in admissions at [facility] as they did not offer.   CSW then called admissions, Angela Nevin for [facility] to see if they can offer a SNF bed. CSW left message.  CSW will f/u as Insurance Auth will be needed once a SNF is selected.  Elissa Hefty, LCSW Clinical Social Worker [phone]

## 2017-06-12 NOTE — Discharge Summary (Addendum)
Physician Discharge Summary  [name] SAY:[id number] DOB: 1940-12-13 DOA: 06/08/2017  PCP: Susy Frizzle, MD  Admit date: 06/08/2017 Discharge date:06/13/2017 Admitted From:home Disposition: snf Recommendations for Outpatient Follow-up:  1. Follow up with PCP in 1-2 weeks 2. Please obtain BMP/CBC in one week  Home Health none  Equipment/Devices:none Discharge Condition:stable CODE STATUS:full Diet recommendation: cardiac Brief/Interim Summary:77 year old woman PMH diabetes mellitus type 2, dementia, presented after mechanical fall at home resulting in left hip pain.  Admitted for left hip fracture.  Underwent operative fixation with only complication being perioperative blood loss and mild AKI subsequently resolved.     Discharge Diagnoses:  Principal Problem:   Closed left hip fracture, initial encounter ([place]) Active Problems:   Diabetes mellitus without complication ([place])   Hypertension   Hyperlipidemia   Acute blood loss anemia   Mild dementia   AKI (acute kidney injury) ([place])   CKD (chronic kidney disease), stage III (H[facility]) Left hip fracture status post mechanical fall at home Per orthopedics:  Weightbearing:WBATLLE  Incision and dressing care:maintain clean Mepilex until follow-up  Showering: Keep dressingand incisionsdry  VTE prophylaxis:Lovenox 40mg  qd30 days, SCDs, ambulation  Pain control:Norco as needed. Minimize narcotics due to history of dementia. Tylenol preferred.  Follow - up plan:2 weeks  Acute blood loss anemia secondary to surgery --Asymptomatic. Check cbc in am.fobt --Follow clinically.  Check CBC in a.m.  If stable, no further evaluation suggested.  AKI superimposed on CKD stage III --Secondary to perioperative fluid shifts, dehydration.  Resolved with IV fluids. Restart cozar. Diabetes mellitus type 2.  Resume Januvia  --CBG remains stable.   Essential hypertension --Remains stable. Hyperlipidemia --Continue  pravastatin.  Dementia without behavioral disturbance --Remains stable.  Continue memantine.      Discharge Instructions  Discharge Instructions    Call MD for:  difficulty breathing, headache or visual disturbances   Complete by:  As directed    Call MD for:  persistant dizziness or light-headedness   Complete by:  As directed    Call MD for:  persistant nausea and vomiting   Complete by:  As directed    Call MD for:  severe uncontrolled pain   Complete by:  As directed    Call MD for:  temperature >100.4   Complete by:  As directed    Diet - low sodium heart healthy   Complete by:  As directed    Increase activity slowly   Complete by:  As directed      Allergies as of 06/12/2017      Reactions   Aricept [donepezil Hcl]    Bactrim [sulfamethoxazole-trimethoprim]    Flagyl [metronidazole] Hives   Lisinopril    hyperkalemia      Medication List    TAKE these medications   aspirin 81 MG chewable tablet Chew 81 mg by mouth.   CALCIUM 1000 + D PO Take by mouth daily.   enoxaparin 40 MG/0.4ML injection Commonly known as:  LOVENOX Inject 0.4 mLs (40 mg total) into the skin daily for 30 doses. For 30 days post op for DVT prophylaxis   escitalopram 10 MG tablet Commonly known as:  LEXAPRO Take 1 tablet (10 mg total) by mouth daily.   fish oil-omega-3 fatty acids 1000 MG capsule Take 1,200 mg by mouth daily.   glucose blood test strip Commonly known as:  ONE TOUCH ULTRA TEST CHECK BLOOD SUGAR EVERYDAY IN THE MORNING DX - E11.9   HYDROcodone-acetaminophen 5-325 MG tablet Commonly known as:  NORCO Take  1-2 tablets by mouth every 6 (six) hours as needed for moderate pain.   hydrOXYzine 10 MG tablet Commonly known as:  ATARAX/VISTARIL TAKE 1 TABLET BY MOUTH EVERY DAY   ICAPS AREDS 2 PO Take 1 capsule by mouth daily.   JANUVIA 100 MG tablet Generic drug:  sitaGLIPtin TAKE 1 TABLET BY MOUTH EVERY DAY   losartan 100 MG tablet Commonly known as:   COZAAR Take 1 tablet (100 mg total) by mouth daily.   memantine 10 MG tablet Commonly known as:  NAMENDA TAKE 1 TABLET BY MOUTH TWICE A DAY   methocarbamol 500 MG tablet Commonly known as:  ROBAXIN Take 1 tablet (500 mg total) by mouth every 6 (six) hours as needed for muscle spasms.   multivitamin tablet Take 1 tablet by mouth daily.   omega-3 acid ethyl esters 1 g capsule Commonly known as:  LOVAZA Take 1 capsule (1 g total) by mouth daily. Start taking on:  06/13/2017   omeprazole 40 MG capsule Commonly known as:  PRILOSEC Take 1 capsule (40 mg total) by mouth every morning.   polyethylene glycol packet Commonly known as:  MIRALAX / GLYCOLAX Take 17 g by mouth 2 (two) times daily.   pravastatin 40 MG tablet Commonly known as:  PRAVACHOL TAKE 1 TABLET BY MOUTH AT BEDTIME   senna 8.6 MG Tabs tablet Commonly known as:  SENOKOT Take 1 tablet (8.6 mg total) by mouth at bedtime.   Vitamin D 2000 units Caps Take 2,000 Units by mouth daily.      Follow-up Information    Renette Butters, MD Follow up in 2 week(s).   Specialty:  Orthopedic Surgery Contact information: [address] [place] [address] [phone]          Allergies  Allergen Reactions  . Aricept [Donepezil Hcl]   . Bactrim [Sulfamethoxazole-Trimethoprim]   . Flagyl [Metronidazole] Hives  . Lisinopril     hyperkalemia    Consultations:  ortho   Procedures/Studies: Dg Chest Port 1 View  Result Date: 06/08/2017 CLINICAL DATA:  Initial preoperative evaluation for left hip fracture. EXAM: PORTABLE CHEST 1 VIEW COMPARISON:  Prior radiograph from 08/23/2010. FINDINGS: Cardiac and mediastinal silhouettes are stable in size and contour, and remain within normal limits. Lungs are hypoinflated. Secondary diffuse bronchovascular crowding. No focal infiltrates. No edema or effusion. No pneumothorax. No acute osseous abnormality. IMPRESSION: 1. Shallow lung inflation with secondary  diffuse bronchovascular crowding. 2. No other active cardiopulmonary disease. Electronically Signed   By: Jeannine Boga M.D.   On: 06/08/2017 03:03   Dg C-arm 1-60 Min  Result Date: 06/08/2017 CLINICAL DATA:  Intramedullary hip fixation EXAM: LEFT FEMUR 2 VIEWS; DG C-ARM 61-120 MIN COMPARISON:  Radiography from earlier the same day FINDINGS: The intertrochanteric femur fracture has been reduced and fixed with a dynamic hip screw and intramedullary nail the hip joint is distracted on both views. IMPRESSION: 1. Fluoroscopy for intertrochanteric femur fracture ORIF. 2. Distracted hip joint during the exam. Electronically Signed   By: Monte Fantasia M.D.   On: 06/08/2017 13:53   Dg C-arm 1-60 Min  Result Date: 06/08/2017 CLINICAL DATA:  Intramedullary hip fixation EXAM: LEFT FEMUR 2 VIEWS; DG C-ARM 61-120 MIN COMPARISON:  Radiography from earlier the same day FINDINGS: The intertrochanteric femur fracture has been reduced and fixed with a dynamic hip screw and intramedullary nail the hip joint is distracted on both views. IMPRESSION: 1. Fluoroscopy for intertrochanteric femur fracture ORIF. 2. Distracted hip joint  during the exam. Electronically Signed   By: Monte Fantasia M.D.   On: 06/08/2017 13:53   Dg Hip Unilat With Pelvis 2-3 Views Left  Result Date: 06/09/2017 CLINICAL DATA:  Hip fracture. EXAM: DG HIP (WITH OR WITHOUT PELVIS) 2-3V LEFT COMPARISON:  06/08/2017 FINDINGS: Interval fixation of patient's left trochanteric fracture with intramedullary nail and associated screw bridging the fracture site into the femoral neck and head. Hardware is intact as there is near anatomic alignment over the fracture site. Remainder of the exam is unchanged. IMPRESSION: Post fixation left intratrochanteric hip fracture with hardware intact and anatomic alignment about the fracture site. Electronically Signed   By: Marin Olp M.D.   On: 06/09/2017 13:38   Dg Hip Unilat With Pelvis 2-3 Views  Left  Result Date: 06/08/2017 CLINICAL DATA:  Left hip pain after fall. EXAM: DG HIP (WITH OR WITHOUT PELVIS) 2-3V LEFT COMPARISON:  None. FINDINGS: Acute left intertrochanteric fracture of the femur is noted with varus angulation as well as cephalad and lateral displacement of the femoral shaft relative to femoral head and neck. Intact bony pelvis. Intact right femur. Mild lumbar spondylosis with multilevel degenerative disc disease and dextroconvex curvature of the lumbar spine. Surgical clips project over the upper pelvis bilaterally. IMPRESSION: Acute displaced and varus angulated intertrochanteric fracture of the left femur. Dextroscoliosis with lumbar spondylosis. Electronically Signed   By: Ashley Royalty M.D.   On: 06/08/2017 02:11   Dg Femur Min 2 Views Left  Result Date: 06/08/2017 CLINICAL DATA:  Intramedullary hip fixation EXAM: LEFT FEMUR 2 VIEWS; DG C-ARM 61-120 MIN COMPARISON:  Radiography from earlier the same day FINDINGS: The intertrochanteric femur fracture has been reduced and fixed with a dynamic hip screw and intramedullary nail the hip joint is distracted on both views. IMPRESSION: 1. Fluoroscopy for intertrochanteric femur fracture ORIF. 2. Distracted hip joint during the exam. Electronically Signed   By: Monte Fantasia M.D.   On: 06/08/2017 13:53    (Echo, Carotid, EGD, Colonoscopy, ERCP)    Subjective:   Discharge Exam: Vitals:   06/11/17 2346 06/12/17 0606  BP: 135/81 (!) 150/69  Pulse: 91 90  Resp: 16 17  Temp: 98.8 F (37.1 C) 98.4 F (36.9 C)  SpO2: 92% 96%   Vitals:   06/11/17 0858 06/11/17 1452 06/11/17 2346 06/12/17 0606  BP: 133/65 (!) 143/71 135/81 (!) 150/69  Pulse: (!) 110 (!) 107 91 90  Resp: 16 18 16 17   Temp: 99.2 F (37.3 C) 97.8 F (36.6 C) 98.8 F (37.1 C) 98.4 F (36.9 C)  TempSrc: Oral Oral Oral Oral  SpO2: 98% 94% 92% 96%  Weight:      Height:        General: Pt is alert, awake, not in acute distress Cardiovascular: RRR, S1/S2 +,  no rubs, no gallops Respiratory: CTA bilaterally, no wheezing, no rhonchi Abdominal: Soft, NT, ND, bowel sounds + Extremities: no edema, no cyanosis    The results of significant diagnostics from this hospitalization (including imaging, microbiology, ancillary and laboratory) are listed below for reference.     Microbiology: No results found for this or any previous visit (from the past 240 hour(s)).   Labs: BNP (last 3 results) No results for input(s): BNP in the last 8760 hours. Basic Metabolic Panel: Recent Labs  Lab 06/08/17 0155 06/08/17 1532 06/09/17 0754 06/10/17 0503 06/11/17 0606  NA 139  --  132* 139 140  K 3.6  --  4.0 4.2 4.0  CL 101  --  99* 104 106  CO2 24  --  24 25 28   GLUCOSE 139*  --  160* 152* 127*  BUN 24*  --  25* 20 14  CREATININE 1.09* 1.16* 1.53* 1.30* 1.04*  CALCIUM 9.3  --  8.1* 8.9 8.6*   Liver Function Tests: Recent Labs  Lab 06/08/17 0155  AST 35  ALT 24  ALKPHOS 80  BILITOT 0.9  PROT 7.4  ALBUMIN 4.1   No results for input(s): LIPASE, AMYLASE in the last 168 hours. No results for input(s): AMMONIA in the last 168 hours. CBC: Recent Labs  Lab 06/08/17 0155 06/08/17 1253 06/09/17 0754 06/10/17 0503 06/11/17 0606 06/12/17 0956  WBC 11.3* 18.8* 8.6 9.8 10.3 9.9  NEUTROABS 8.8*  --   --   --   --   --   HGB 13.0 12.7 9.5* 8.4* 7.9* 8.4*  HCT 39.3 38.0 29.0* 25.5* 23.6* 26.2*  MCV 95.6 94.8 97.6 96.2 95.2 95.3  PLT 248 191 192 165 209 284   Cardiac Enzymes: No results for input(s): CKTOTAL, CKMB, CKMBINDEX, TROPONINI in the last 168 hours. BNP: Invalid input(s): POCBNP CBG: Recent Labs  Lab 06/11/17 2042 06/11/17 2359 06/12/17 0439 06/12/17 0752 06/12/17 1130  GLUCAP 150* 105* 117* 140* 113*   D-Dimer No results for input(s): DDIMER in the last 72 hours. Hgb A1c No results for input(s): HGBA1C in the last 72 hours. Lipid Profile No results for input(s): CHOL, HDL, LDLCALC, TRIG, CHOLHDL, LDLDIRECT in the last 72  hours. Thyroid function studies No results for input(s): TSH, T4TOTAL, T3FREE, THYROIDAB in the last 72 hours.  Invalid input(s): FREET3 Anemia work up No results for input(s): VITAMINB12, FOLATE, FERRITIN, TIBC, IRON, RETICCTPCT in the last 72 hours. Urinalysis    Component Value Date/Time   COLORURINE RED (A) 07/02/2016 0932   APPEARANCEUR CLOUDY (A) 07/02/2016 0932   LABSPEC 1.025 07/02/2016 0932   PHURINE 6.0 07/02/2016 0932   GLUCOSEU NEGATIVE 07/02/2016 0932   HGBUR 3+ (A) 07/02/2016 0932   BILIRUBINUR NEGATIVE 07/02/2016 0932   KETONESUR TRACE (A) 07/02/2016 0932   PROTEINUR 3+ (A) 07/02/2016 0932   NITRITE POSITIVE (A) 07/02/2016 0932   LEUKOCYTESUR 3+ (A) 07/02/2016 0932   Sepsis Labs Invalid input(s): PROCALCITONIN,  WBC,  LACTICIDVEN Microbiology No results found for this or any previous visit (from the past 240 hour(s)).   Time coordinating discharge: 34 minutes  SIGNED:   Georgette Shell, MD  [facility] 06/12/2017, 12:06 PM Pager   If 7PM-7AM, please contact night-coverage [email or web address] Password [email or web address]

## 2017-06-12 NOTE — Social Work (Addendum)
Spouse indicated that he was interested in [facility] SNF. CSW f/u.  Also, CSW sent clinicals to [place] at [facility].  CSW f/u for placement.  2:51pm SNF confirmed SNF bed offer [facility]. CSW called [employer] and left message to start Insurance Auth for placement.  Elissa Hefty, LCSW Clinical Social Worker [phone]

## 2017-06-13 DIAGNOSIS — M6281 Muscle weakness (generalized): Secondary | ICD-10-CM | POA: Diagnosis not present

## 2017-06-13 DIAGNOSIS — Z419 Encounter for procedure for purposes other than remedying health state, unspecified: Secondary | ICD-10-CM | POA: Diagnosis not present

## 2017-06-13 DIAGNOSIS — E569 Vitamin deficiency, unspecified: Secondary | ICD-10-CM | POA: Diagnosis not present

## 2017-06-13 DIAGNOSIS — S72002D Fracture of unspecified part of neck of left femur, subsequent encounter for closed fracture with routine healing: Secondary | ICD-10-CM | POA: Diagnosis not present

## 2017-06-13 DIAGNOSIS — F0390 Unspecified dementia without behavioral disturbance: Secondary | ICD-10-CM | POA: Diagnosis not present

## 2017-06-13 DIAGNOSIS — K5900 Constipation, unspecified: Secondary | ICD-10-CM | POA: Diagnosis not present

## 2017-06-13 DIAGNOSIS — N189 Chronic kidney disease, unspecified: Secondary | ICD-10-CM | POA: Diagnosis not present

## 2017-06-13 DIAGNOSIS — S72142D Displaced intertrochanteric fracture of left femur, subsequent encounter for closed fracture with routine healing: Secondary | ICD-10-CM | POA: Diagnosis not present

## 2017-06-13 DIAGNOSIS — M62838 Other muscle spasm: Secondary | ICD-10-CM | POA: Diagnosis not present

## 2017-06-13 DIAGNOSIS — M62562 Muscle wasting and atrophy, not elsewhere classified, left lower leg: Secondary | ICD-10-CM | POA: Diagnosis not present

## 2017-06-13 DIAGNOSIS — K219 Gastro-esophageal reflux disease without esophagitis: Secondary | ICD-10-CM | POA: Diagnosis not present

## 2017-06-13 DIAGNOSIS — N183 Chronic kidney disease, stage 3 (moderate): Secondary | ICD-10-CM | POA: Diagnosis not present

## 2017-06-13 DIAGNOSIS — I10 Essential (primary) hypertension: Secondary | ICD-10-CM | POA: Diagnosis not present

## 2017-06-13 DIAGNOSIS — D649 Anemia, unspecified: Secondary | ICD-10-CM | POA: Diagnosis not present

## 2017-06-13 DIAGNOSIS — R278 Other lack of coordination: Secondary | ICD-10-CM | POA: Diagnosis not present

## 2017-06-13 DIAGNOSIS — E785 Hyperlipidemia, unspecified: Secondary | ICD-10-CM | POA: Diagnosis not present

## 2017-06-13 DIAGNOSIS — F329 Major depressive disorder, single episode, unspecified: Secondary | ICD-10-CM | POA: Diagnosis not present

## 2017-06-13 DIAGNOSIS — S7292XA Unspecified fracture of left femur, initial encounter for closed fracture: Secondary | ICD-10-CM | POA: Diagnosis not present

## 2017-06-13 DIAGNOSIS — Z7401 Bed confinement status: Secondary | ICD-10-CM | POA: Diagnosis not present

## 2017-06-13 DIAGNOSIS — E119 Type 2 diabetes mellitus without complications: Secondary | ICD-10-CM | POA: Diagnosis not present

## 2017-06-13 DIAGNOSIS — S72002A Fracture of unspecified part of neck of left femur, initial encounter for closed fracture: Secondary | ICD-10-CM | POA: Diagnosis not present

## 2017-06-13 DIAGNOSIS — T148XXD Other injury of unspecified body region, subsequent encounter: Secondary | ICD-10-CM | POA: Diagnosis not present

## 2017-06-13 DIAGNOSIS — R52 Pain, unspecified: Secondary | ICD-10-CM | POA: Diagnosis not present

## 2017-06-13 DIAGNOSIS — M2550 Pain in unspecified joint: Secondary | ICD-10-CM | POA: Diagnosis not present

## 2017-06-13 DIAGNOSIS — Z4789 Encounter for other orthopedic aftercare: Secondary | ICD-10-CM | POA: Diagnosis not present

## 2017-06-13 DIAGNOSIS — R262 Difficulty in walking, not elsewhere classified: Secondary | ICD-10-CM | POA: Diagnosis not present

## 2017-06-13 DIAGNOSIS — Z111 Encounter for screening for respiratory tuberculosis: Secondary | ICD-10-CM | POA: Diagnosis not present

## 2017-06-13 DIAGNOSIS — Z9181 History of falling: Secondary | ICD-10-CM | POA: Diagnosis not present

## 2017-06-13 DIAGNOSIS — R41841 Cognitive communication deficit: Secondary | ICD-10-CM | POA: Diagnosis not present

## 2017-06-13 DIAGNOSIS — N179 Acute kidney failure, unspecified: Secondary | ICD-10-CM | POA: Diagnosis not present

## 2017-06-13 DIAGNOSIS — F0150 Vascular dementia without behavioral disturbance: Secondary | ICD-10-CM | POA: Diagnosis not present

## 2017-06-13 DIAGNOSIS — F419 Anxiety disorder, unspecified: Secondary | ICD-10-CM | POA: Diagnosis not present

## 2017-06-13 LAB — CBC WITH DIFFERENTIAL/PLATELET
ABS IMMATURE GRANULOCYTES: 0.1 10*3/uL (ref 0.0–0.1)
BASOS ABS: 0.1 10*3/uL (ref 0.0–0.1)
Basophils Relative: 1 %
Eosinophils Absolute: 0.4 10*3/uL (ref 0.0–0.7)
Eosinophils Relative: 4 %
HCT: 25.5 % — ABNORMAL LOW (ref 36.0–46.0)
HEMOGLOBIN: 8.3 g/dL — AB (ref 12.0–15.0)
IMMATURE GRANULOCYTES: 2 %
LYMPHS PCT: 20 %
Lymphs Abs: 1.9 10*3/uL (ref 0.7–4.0)
MCH: 30.9 pg (ref 26.0–34.0)
MCHC: 32.5 g/dL (ref 30.0–36.0)
MCV: 94.8 fL (ref 78.0–100.0)
MONO ABS: 0.9 10*3/uL (ref 0.1–1.0)
MONOS PCT: 9 %
Neutro Abs: 6.2 10*3/uL (ref 1.7–7.7)
Neutrophils Relative %: 64 %
Platelets: 312 10*3/uL (ref 150–400)
RBC: 2.69 MIL/uL — ABNORMAL LOW (ref 3.87–5.11)
RDW: 12.4 % (ref 11.5–15.5)
WBC: 9.5 10*3/uL (ref 4.0–10.5)

## 2017-06-13 LAB — GLUCOSE, CAPILLARY
Glucose-Capillary: 111 mg/dL — ABNORMAL HIGH (ref 65–99)
Glucose-Capillary: 136 mg/dL — ABNORMAL HIGH (ref 65–99)

## 2017-06-13 MED ORDER — FERROUS SULFATE 325 (65 FE) MG PO TABS: 325.0000 mg | ORAL_TABLET | Freq: Two times a day (BID) | ORAL | 3 refills | Status: DC

## 2017-06-13 NOTE — Clinical Social Work Placement (Signed)
   CLINICAL SOCIAL WORK PLACEMENT  NOTE  Date:  06/13/2017  Patient Details  Name: [name] MRN: [id number] Date of Birth: September 11, 1940  Clinical Social Work is seeking post-discharge placement for this patient at the [place] level of care (*CSW will initial, date and re-position this form in  chart as items are completed):  Yes   Patient/family provided with [facility] Work Department's list of facilities offering this level of care within the geographic area requested by the patient (or if unable, by the patient's family).  Yes   Patient/family informed of their freedom to choose among providers that offer the needed level of care, that participate in Medicare, Medicaid or managed care program needed by the patient, have an available bed and are willing to accept the patient.  Yes   Patient/family informed of 's ownership interest in [facility] and [facility], as well as of the fact that they are under no obligation to receive care at these facilities.  PASRR submitted to EDS on       PASRR number received on 06/11/17     Existing PASRR number confirmed on       FL2 transmitted to all facilities in geographic area requested by pt/family on 06/10/17     FL2 transmitted to all facilities within larger geographic area on       Patient informed that his/her managed care company has contracts with or will negotiate with certain facilities, including the following:        Yes   Patient/family informed of bed offers received.  Patient chooses bed at [facility]     Physician recommends and patient chooses bed at      Patient to be transferred to [facility] on 06/13/17.  Patient to be transferred to facility by [facility]     Patient family notified on 06/13/17 of transfer.  Name of family member notified:  spouse [place] advised     PHYSICIAN       Additional Comment:     _______________________________________________ Normajean Baxter, LCSW 06/13/2017, 10:52 AM

## 2017-06-13 NOTE — Social Work (Signed)
CSW received Insurance [id number] for SNF placement.  CSW will f/u for disposition.  Elissa Hefty, LCSW Clinical Social Worker [phone]

## 2017-06-13 NOTE — Social Work (Signed)
Clinical Social Worker facilitated patient discharge including contacting patient family and facility to confirm patient discharge plans.  Clinical information faxed to facility and family agreeable with plan.    CSW arranged ambulance transport via [facility] to [facility] SNF.    RN to call [phone] to give report prior to discharge. Pt going to Room .60B  Clinical Social Worker will sign off for now as social work intervention is no longer needed. Please consult [place] again if new need arises.  Elissa Hefty, LCSW Clinical Social Worker [phone]

## 2017-06-13 NOTE — Plan of Care (Signed)
  Problem: Education: Goal: Knowledge of General Education information will improve 06/13/2017 1332 by Rance Muir, RN Outcome: Adequate for Discharge 06/13/2017 1118 by Rance Muir, RN Outcome: Progressing

## 2017-06-13 NOTE — Progress Notes (Signed)
Report called to [facility] and given to Psychologist, prison and probation services

## 2017-06-13 NOTE — Progress Notes (Signed)
Physical Therapy Treatment Patient Details Name: [name] MRN: [id number] DOB: September 11, 1940 Today's Date: 06/13/2017    History of Present Illness Pt is a 77 y.o. female with medical history significant of DM2, HTN, and mild dementia.  Patient presented to the ED with c/o L hip pain after slipping and falling. Xray revealed L hip fx. She is now s/p IM nail.     PT Comments    Pt presented supine in bed with HOB elevated, awake and willing to participate in therapy session. Focus of session was on safe transfers to a w/c and safety in w/c for pt to be able to transport from her room to visit her daughter in [place] with husband and an Therapist, sports. Pt continues to require physical assist of two for safety with transfers. Pt would continue to benefit from skilled physical therapy services at this time while admitted and after d/c to address the below listed limitations in order to improve overall safety and independence with functional mobility.   Follow Up Recommendations  SNF     Equipment Recommendations  3in1 (PT)    Recommendations for Other Services       Precautions / Restrictions Precautions Precautions: Fall Restrictions Weight Bearing Restrictions: Yes LLE Weight Bearing: Weight bearing as tolerated    Mobility  Bed Mobility Overal bed mobility: Needs Assistance Bed Mobility: Supine to Sit     Supine to sit: Min assist     General bed mobility comments: increased time and effort, assist with L LE movement   Transfers Overall transfer level: Needs assistance Equipment used: Rolling walker (2 wheeled) Transfers: Sit to/from [facility] Sit to Stand: Mod assist;+2 safety/equipment Stand pivot transfers: Min assist;+2 safety/equipment       General transfer comment: cueing for safety hand placement and technique, assist to power into standing from EOB and assist for stability with transitional movements to w/c  Ambulation/Gait              General Gait Details: deferred   Stairs             Wheelchair Mobility    Modified Rankin (Stroke Patients Only)       Balance Overall balance assessment: Needs assistance Sitting-balance support: Feet supported Sitting balance-Leahy Scale: Fair     Standing balance support: Bilateral upper extremity supported;During functional activity Standing balance-Leahy Scale: Poor Standing balance comment: reliant on RW                            Cognition Arousal/Alertness: Awake/alert Behavior During Therapy: WFL for tasks assessed/performed Overall Cognitive Status: Impaired/Different from baseline Area of Impairment: Memory;Following commands;Attention;Safety/judgement;Problem solving                   Current Attention Level: Selective Memory: Decreased short-term memory Following Commands: Follows one step commands inconsistently Safety/Judgement: Decreased awareness of deficits;Decreased awareness of safety   Problem Solving: Difficulty sequencing;Requires verbal cues        Exercises      General Comments        Pertinent Vitals/Pain Pain Assessment: Faces Faces Pain Scale: Hurts even more Pain Location: L hip Pain Descriptors / Indicators: Grimacing;Guarding;Sore Pain Intervention(s): Monitored during session;Repositioned    Home Living                      Prior Function            PT Goals (current  goals can now be found in the care plan section) Acute Rehab PT Goals PT Goal Formulation: With patient Time For Goal Achievement: 06/23/17 Potential to Achieve Goals: Fair Progress towards PT goals: Progressing toward goals    Frequency    Min 3X/week      PT Plan Current plan remains appropriate    Co-evaluation              AM-PAC PT "6 Clicks" Daily Activity  Outcome Measure  Difficulty turning over in bed (including adjusting bedclothes, sheets and blankets)?: Unable Difficulty moving from  lying on back to sitting on the side of the bed? : Unable Difficulty sitting down on and standing up from a chair with arms (e.g., wheelchair, bedside commode, etc,.)?: Unable Help needed moving to and from a bed to chair (including a wheelchair)?: A Little Help needed walking in hospital room?: A Lot Help needed climbing 3-5 steps with a railing? : Total 6 Click Score: 9    End of Session Equipment Utilized During Treatment: Gait belt Activity Tolerance: Patient tolerated treatment well Patient left: with call bell/phone within reach;with family/visitor present;Other (comment)(in w/c with RN, nurse tech and husband) Nurse Communication: Mobility status PT Visit Diagnosis: Other abnormalities of gait and mobility (R26.89);Difficulty in walking, not elsewhere classified (R26.2);Pain Pain - Right/Left: Left Pain - part of body: Hip     Time: 4492-0100 PT Time Calculation (min) (ACUTE ONLY): 28 min  Charges:  $Therapeutic Activity: 23-37 mins                    G Codes:       [place], PT, DPT [place] 06/13/2017, 2:30 PM

## 2017-06-13 NOTE — Plan of Care (Signed)
  Problem: Education: Goal: Knowledge of General Education information will improve Outcome: Progressing   

## 2017-06-13 NOTE — Care Management Important Message (Signed)
Important Message  Patient Details  Name: [name] MRN: [id number] Date of Birth: 11-29-1940   Medicare Important Message Given:  Yes    Orbie Pyo 06/13/2017, 10:42 AM

## 2017-06-13 NOTE — Progress Notes (Signed)
Visited wit pt per [facility] to support patient who is experiencing some fear and uncertainty about a daughter that is actively dying on another floor.  Patient is accepting and coping with the possible outcome of daughter. Provides presence and emotional support. Will follow as needed.    06/13/17 1310  Clinical Encounter Type  Visited With Patient and family together;Health care provider  Visit Type Initial;Spiritual support  Referral From Care management  Spiritual Encounters  Spiritual Needs Prayer;Emotional  Stress Factors  Patient Stress Factors Family relationships  Cristopher Peru, [facility], Pager [phone]

## 2017-06-13 NOTE — Social Work (Addendum)
Pt will discharge to [facility] SNF today.  CSW called [facility] for insurance auth and they are working on same.  CSW willf/u for updated dc for placement today.  Elissa Hefty, LCSW Clinical Social Worker [phone]

## 2017-06-13 NOTE — Progress Notes (Signed)
PROGRESS NOTE    Jacey Eckerson Garn  GHW:[id number] DOB: 08/23/1940 DOA: 06/08/2017 PCP: Susy Frizzle, MD  Brief Narrative:77 year old woman PMH diabetes mellitus type 2, dementia, presented after mechanical fall at home resulting in left hip pain.  Admitted for left hip fracture.  Underwent operative fixation with only complication being perioperative blood loss and mild AKI subsequently resolved.  Plan for skilled nursing facility 5/22 with hemoglobin remained stable.    Assessment & Plan:   Principal Problem:   Closed left hip fracture, initial encounter ([place]) Active Problems:   Diabetes mellitus without complication ([place])   Hypertension   Hyperlipidemia   Acute blood loss anemia   Mild dementia   AKI (acute kidney injury) ([place])   CKD (chronic kidney disease), stage III ([facility])  Left hip fracture status post mechanical fall at home Per orthopedics:  Weightbearing:WBATLLE  Incision and dressing care:maintain clean Mepilex until follow-up  Showering: Keep dressingand incisionsdry  VTE prophylaxis:Lovenox 40mg  qd30 days, SCDs, ambulation  Pain control:Norco as needed. Minimize narcotics due to history of dementia. Tylenol preferred.  Follow - up plan:2 weeks  Acute blood loss anemia secondary to surgery --Asymptomatic.  Hemoglobin probably not significantly different compared to yesterday.  Doubt ongoing bleeding. --Follow clinically.  Check CBC in a.m.  If stable, no further evaluation suggested.  AKI superimposed on CKD stage III --Secondary to perioperative fluid shifts, dehydration.  Resolved with IV fluids.  Losartan on hold. --Discontinue IV fluids.  Diabetes mellitus type 2.  Resume Januvia on discharge. --CBG remains stable.  Continue sliding scale insulin.  Essential hypertension --Remains stable.  Will hold losartan as above.  Hyperlipidemia --Continue pravastatin.  Dementia without behavioral disturbance --Remains stable.  Continue  memantine.     DVT prophylaxis: Lovenox Code Status: Full code Family Communication: None Disposition Plan DC to skilled nursing facility today Consultants: Ortho  Procedures: Intramedullary nail of the left hip 518 Antimicrobials: None Subjective: No complaints  Objective: Vitals:   06/12/17 0606 06/12/17 1804 06/12/17 2153 06/13/17 0648  BP: (!) 150/69 136/70 (!) 134/98 (!) 146/66  Pulse: 90 (!) 106 97 91  Resp: 17 16    Temp: 98.4 F (36.9 C) 98.7 F (37.1 C)  98.9 F (37.2 C)  TempSrc: Oral Oral  Oral  SpO2: 96% 95%  94%  Weight:      Height:        Intake/Output Summary (Last 24 hours) at 06/13/2017 0915 Last data filed at 06/13/2017 0648 Gross per 24 hour  Intake 480 ml  Output 1100 ml  Net -620 ml   Filed Weights   06/08/17 0155  Weight: 81.6 kg (180 lb)    Examination:  General exam: Appears calm and comfortable  Respiratory system: Clear to auscultation. Respiratory effort normal. Cardiovascular system: S1 & S2 heard, RRR. No JVD, murmurs, rubs, gallops or clicks. No pedal edema. Gastrointestinal system: Abdomen is nondistended, soft and nontender. No organomegaly or masses felt. Normal bowel sounds heard. Central nervous system: Alert and oriented. No focal neurological deficits. Extremities: Symmetric 5 x 5 power. Skin: No rashes, lesions or ulcers Psychiatry: Judgement and insight appear normal. Mood & affect appropriate.     Data Reviewed: I have personally reviewed following labs and imaging studies  CBC: Recent Labs  Lab 06/08/17 0155  06/09/17 0754 06/10/17 0503 06/11/17 0606 06/12/17 0956 06/13/17 0409  WBC 11.3*   < > 8.6 9.8 10.3 9.9 9.5  NEUTROABS 8.8*  --   --   --   --   --  6.2  HGB 13.0   < > 9.5* 8.4* 7.9* 8.4* 8.3*  HCT 39.3   < > 29.0* 25.5* 23.6* 26.2* 25.5*  MCV 95.6   < > 97.6 96.2 95.2 95.3 94.8  PLT 248   < > 192 165 209 284 312   < > = values in this interval not displayed.   Basic Metabolic Panel: Recent Labs   Lab 06/08/17 0155 06/08/17 1532 06/09/17 0754 06/10/17 0503 06/11/17 0606  NA 139  --  132* 139 140  K 3.6  --  4.0 4.2 4.0  CL 101  --  99* 104 106  CO2 24  --  24 25 28   GLUCOSE 139*  --  160* 152* 127*  BUN 24*  --  25* 20 14  CREATININE 1.09* 1.16* 1.53* 1.30* 1.04*  CALCIUM 9.3  --  8.1* 8.9 8.6*   GFR: Estimated Creatinine Clearance: 46.3 mL/min (A) (by C-G formula based on SCr of 1.04 mg/dL (H)). Liver Function Tests: Recent Labs  Lab 06/08/17 0155  AST 35  ALT 24  ALKPHOS 80  BILITOT 0.9  PROT 7.4  ALBUMIN 4.1   No results for input(s): LIPASE, AMYLASE in the last 168 hours. No results for input(s): AMMONIA in the last 168 hours. Coagulation Profile: Recent Labs  Lab 06/08/17 0219  INR 1.02   Cardiac Enzymes: No results for input(s): CKTOTAL, CKMB, CKMBINDEX, TROPONINI in the last 168 hours. BNP (last 3 results) No results for input(s): PROBNP in the last 8760 hours. HbA1C: No results for input(s): HGBA1C in the last 72 hours. CBG: Recent Labs  Lab 06/12/17 0752 06/12/17 1130 06/12/17 1759 06/12/17 2335 06/13/17 0646  GLUCAP 140* 113* 160* 122* 111*   Lipid Profile: No results for input(s): CHOL, HDL, LDLCALC, TRIG, CHOLHDL, LDLDIRECT in the last 72 hours. Thyroid Function Tests: No results for input(s): TSH, T4TOTAL, FREET4, T3FREE, THYROIDAB in the last 72 hours. Anemia Panel: No results for input(s): VITAMINB12, FOLATE, FERRITIN, TIBC, IRON, RETICCTPCT in the last 72 hours. Sepsis Labs: No results for input(s): PROCALCITON, LATICACIDVEN in the last 168 hours.  No results found for this or any previous visit (from the past 240 hour(s)).       Radiology Studies: No results found.      Scheduled Meds: . calcium-vitamin D  1 tablet Oral Q breakfast  . cholecalciferol  2,000 Units Oral Daily  . enoxaparin (LOVENOX) injection  40 mg Subcutaneous Q24H  . escitalopram  10 mg Oral Daily  . ferrous sulfate  325 mg Oral BID WC  .  hydrOXYzine  10 mg Oral Daily  . insulin aspart  0-9 Units Subcutaneous Q4H  . memantine  10 mg Oral BID  . multivitamin with minerals  1 tablet Oral Daily  . omega-3 acid ethyl esters  1 g Oral Daily  . pantoprazole  80 mg Oral Daily  . polyethylene glycol  17 g Oral BID  . pravastatin  40 mg Oral QHS  . senna  1 tablet Oral QHS  . simethicone  160 mg Oral Once   Continuous Infusions: . methocarbamol (ROBAXIN)  IV       LOS: 5 days     Georgette Shell, MD [facility] If 7PM-7AM, please contact night-coverage [email or web address] Password [email or web address] 06/13/2017, 9:15 AM

## 2017-06-17 DIAGNOSIS — S7292XA Unspecified fracture of left femur, initial encounter for closed fracture: Secondary | ICD-10-CM | POA: Diagnosis not present

## 2017-06-17 DIAGNOSIS — I10 Essential (primary) hypertension: Secondary | ICD-10-CM | POA: Diagnosis not present

## 2017-06-17 DIAGNOSIS — K219 Gastro-esophageal reflux disease without esophagitis: Secondary | ICD-10-CM | POA: Diagnosis not present

## 2017-06-17 DIAGNOSIS — F0390 Unspecified dementia without behavioral disturbance: Secondary | ICD-10-CM | POA: Diagnosis not present

## 2017-06-17 DIAGNOSIS — D649 Anemia, unspecified: Secondary | ICD-10-CM | POA: Diagnosis not present

## 2017-06-17 DIAGNOSIS — E785 Hyperlipidemia, unspecified: Secondary | ICD-10-CM | POA: Diagnosis not present

## 2017-06-17 DIAGNOSIS — E119 Type 2 diabetes mellitus without complications: Secondary | ICD-10-CM | POA: Diagnosis not present

## 2017-06-17 DIAGNOSIS — N179 Acute kidney failure, unspecified: Secondary | ICD-10-CM | POA: Diagnosis not present

## 2017-06-17 DIAGNOSIS — N189 Chronic kidney disease, unspecified: Secondary | ICD-10-CM | POA: Diagnosis not present

## 2017-06-17 DIAGNOSIS — K5900 Constipation, unspecified: Secondary | ICD-10-CM | POA: Diagnosis not present

## 2017-06-17 DIAGNOSIS — M6281 Muscle weakness (generalized): Secondary | ICD-10-CM | POA: Diagnosis not present

## 2017-06-17 DIAGNOSIS — Z4789 Encounter for other orthopedic aftercare: Secondary | ICD-10-CM | POA: Diagnosis not present

## 2017-06-19 DIAGNOSIS — E785 Hyperlipidemia, unspecified: Secondary | ICD-10-CM | POA: Diagnosis not present

## 2017-06-19 DIAGNOSIS — K219 Gastro-esophageal reflux disease without esophagitis: Secondary | ICD-10-CM | POA: Diagnosis not present

## 2017-06-19 DIAGNOSIS — M6281 Muscle weakness (generalized): Secondary | ICD-10-CM | POA: Diagnosis not present

## 2017-06-19 DIAGNOSIS — N189 Chronic kidney disease, unspecified: Secondary | ICD-10-CM | POA: Diagnosis not present

## 2017-06-19 DIAGNOSIS — I10 Essential (primary) hypertension: Secondary | ICD-10-CM | POA: Diagnosis not present

## 2017-06-19 DIAGNOSIS — K5900 Constipation, unspecified: Secondary | ICD-10-CM | POA: Diagnosis not present

## 2017-06-19 DIAGNOSIS — E119 Type 2 diabetes mellitus without complications: Secondary | ICD-10-CM | POA: Diagnosis not present

## 2017-06-19 DIAGNOSIS — N179 Acute kidney failure, unspecified: Secondary | ICD-10-CM | POA: Diagnosis not present

## 2017-06-19 DIAGNOSIS — F0390 Unspecified dementia without behavioral disturbance: Secondary | ICD-10-CM | POA: Diagnosis not present

## 2017-06-19 DIAGNOSIS — S7292XA Unspecified fracture of left femur, initial encounter for closed fracture: Secondary | ICD-10-CM | POA: Diagnosis not present

## 2017-06-19 DIAGNOSIS — D649 Anemia, unspecified: Secondary | ICD-10-CM | POA: Diagnosis not present

## 2017-06-19 DIAGNOSIS — Z4789 Encounter for other orthopedic aftercare: Secondary | ICD-10-CM | POA: Diagnosis not present

## 2017-06-21 DIAGNOSIS — Z4789 Encounter for other orthopedic aftercare: Secondary | ICD-10-CM | POA: Diagnosis not present

## 2017-06-21 DIAGNOSIS — K219 Gastro-esophageal reflux disease without esophagitis: Secondary | ICD-10-CM | POA: Diagnosis not present

## 2017-06-21 DIAGNOSIS — D649 Anemia, unspecified: Secondary | ICD-10-CM | POA: Diagnosis not present

## 2017-06-21 DIAGNOSIS — S7292XA Unspecified fracture of left femur, initial encounter for closed fracture: Secondary | ICD-10-CM | POA: Diagnosis not present

## 2017-06-21 DIAGNOSIS — E785 Hyperlipidemia, unspecified: Secondary | ICD-10-CM | POA: Diagnosis not present

## 2017-06-21 DIAGNOSIS — K5900 Constipation, unspecified: Secondary | ICD-10-CM | POA: Diagnosis not present

## 2017-06-21 DIAGNOSIS — N189 Chronic kidney disease, unspecified: Secondary | ICD-10-CM | POA: Diagnosis not present

## 2017-06-21 DIAGNOSIS — F0390 Unspecified dementia without behavioral disturbance: Secondary | ICD-10-CM | POA: Diagnosis not present

## 2017-06-21 DIAGNOSIS — I10 Essential (primary) hypertension: Secondary | ICD-10-CM | POA: Diagnosis not present

## 2017-06-21 DIAGNOSIS — E119 Type 2 diabetes mellitus without complications: Secondary | ICD-10-CM | POA: Diagnosis not present

## 2017-06-21 DIAGNOSIS — M6281 Muscle weakness (generalized): Secondary | ICD-10-CM | POA: Diagnosis not present

## 2017-06-21 DIAGNOSIS — N179 Acute kidney failure, unspecified: Secondary | ICD-10-CM | POA: Diagnosis not present

## 2017-06-24 DIAGNOSIS — E119 Type 2 diabetes mellitus without complications: Secondary | ICD-10-CM | POA: Diagnosis not present

## 2017-06-24 DIAGNOSIS — D649 Anemia, unspecified: Secondary | ICD-10-CM | POA: Diagnosis not present

## 2017-06-24 DIAGNOSIS — M6281 Muscle weakness (generalized): Secondary | ICD-10-CM | POA: Diagnosis not present

## 2017-06-24 DIAGNOSIS — F0390 Unspecified dementia without behavioral disturbance: Secondary | ICD-10-CM | POA: Diagnosis not present

## 2017-06-24 DIAGNOSIS — K5900 Constipation, unspecified: Secondary | ICD-10-CM | POA: Diagnosis not present

## 2017-06-24 DIAGNOSIS — N189 Chronic kidney disease, unspecified: Secondary | ICD-10-CM | POA: Diagnosis not present

## 2017-06-24 DIAGNOSIS — S7292XA Unspecified fracture of left femur, initial encounter for closed fracture: Secondary | ICD-10-CM | POA: Diagnosis not present

## 2017-06-24 DIAGNOSIS — N179 Acute kidney failure, unspecified: Secondary | ICD-10-CM | POA: Diagnosis not present

## 2017-06-24 DIAGNOSIS — Z4789 Encounter for other orthopedic aftercare: Secondary | ICD-10-CM | POA: Diagnosis not present

## 2017-06-24 DIAGNOSIS — I10 Essential (primary) hypertension: Secondary | ICD-10-CM | POA: Diagnosis not present

## 2017-06-24 DIAGNOSIS — K219 Gastro-esophageal reflux disease without esophagitis: Secondary | ICD-10-CM | POA: Diagnosis not present

## 2017-06-24 DIAGNOSIS — E785 Hyperlipidemia, unspecified: Secondary | ICD-10-CM | POA: Diagnosis not present

## 2017-06-26 DIAGNOSIS — M6281 Muscle weakness (generalized): Secondary | ICD-10-CM | POA: Diagnosis not present

## 2017-06-26 DIAGNOSIS — S72002D Fracture of unspecified part of neck of left femur, subsequent encounter for closed fracture with routine healing: Secondary | ICD-10-CM | POA: Diagnosis not present

## 2017-06-26 DIAGNOSIS — F0150 Vascular dementia without behavioral disturbance: Secondary | ICD-10-CM | POA: Diagnosis not present

## 2017-06-28 DIAGNOSIS — S72142D Displaced intertrochanteric fracture of left femur, subsequent encounter for closed fracture with routine healing: Secondary | ICD-10-CM | POA: Diagnosis not present

## 2017-07-01 DIAGNOSIS — E785 Hyperlipidemia, unspecified: Secondary | ICD-10-CM | POA: Diagnosis not present

## 2017-07-01 DIAGNOSIS — F419 Anxiety disorder, unspecified: Secondary | ICD-10-CM | POA: Diagnosis not present

## 2017-07-01 DIAGNOSIS — D649 Anemia, unspecified: Secondary | ICD-10-CM | POA: Diagnosis not present

## 2017-07-01 DIAGNOSIS — E119 Type 2 diabetes mellitus without complications: Secondary | ICD-10-CM | POA: Diagnosis not present

## 2017-07-01 DIAGNOSIS — N189 Chronic kidney disease, unspecified: Secondary | ICD-10-CM | POA: Diagnosis not present

## 2017-07-01 DIAGNOSIS — Z4789 Encounter for other orthopedic aftercare: Secondary | ICD-10-CM | POA: Diagnosis not present

## 2017-07-01 DIAGNOSIS — I10 Essential (primary) hypertension: Secondary | ICD-10-CM | POA: Diagnosis not present

## 2017-07-01 DIAGNOSIS — N179 Acute kidney failure, unspecified: Secondary | ICD-10-CM | POA: Diagnosis not present

## 2017-07-01 DIAGNOSIS — S7292XA Unspecified fracture of left femur, initial encounter for closed fracture: Secondary | ICD-10-CM | POA: Diagnosis not present

## 2017-07-01 DIAGNOSIS — M6281 Muscle weakness (generalized): Secondary | ICD-10-CM | POA: Diagnosis not present

## 2017-07-01 DIAGNOSIS — K5900 Constipation, unspecified: Secondary | ICD-10-CM | POA: Diagnosis not present

## 2017-07-01 DIAGNOSIS — K219 Gastro-esophageal reflux disease without esophagitis: Secondary | ICD-10-CM | POA: Diagnosis not present

## 2017-07-04 ENCOUNTER — Other Ambulatory Visit: Payer: Self-pay | Admitting: *Deleted

## 2017-07-04 NOTE — Patient Outreach (Signed)
[facility]) Care Management  07/04/2017  [name] 1940-08-30 [id number]  Referral from [facility]; member will be going home with husband from SNF. Patient has mild dementia. Couple just lost daughter to cancer this week.  Per chart review: Admission 5/18-5/23/2019 DX left hip fracture after fall at home Surgery: Intramedullary Nail left Hip Patient discharged to SNF   Telephone call to patient; spouse answered call and advised that patient was still was in skilled facility and was going to be discharged tomorrow with home health services. . States he was patient's caregiver and wanted to know reason for call. Caregiver/spouse was advised of [facility] care management services. States he has taken care of patient prior to hospitalization and plans to take care of her when she comes home. States he has assistance from his children and their spouses as needed. States he manages patient's medications. Voices that he also takes patient to MD appointments.   States she does not want [facility] services at this time. Declines contact information.  Plan: Close case.   Sherrin Daisy, RN BSN [place] Management Coordinator [facility] Care Management  [phone]

## 2017-07-05 DIAGNOSIS — K219 Gastro-esophageal reflux disease without esophagitis: Secondary | ICD-10-CM | POA: Diagnosis not present

## 2017-07-05 DIAGNOSIS — N189 Chronic kidney disease, unspecified: Secondary | ICD-10-CM | POA: Diagnosis not present

## 2017-07-05 DIAGNOSIS — S7292XA Unspecified fracture of left femur, initial encounter for closed fracture: Secondary | ICD-10-CM | POA: Diagnosis not present

## 2017-07-05 DIAGNOSIS — E785 Hyperlipidemia, unspecified: Secondary | ICD-10-CM | POA: Diagnosis not present

## 2017-07-05 DIAGNOSIS — Z4789 Encounter for other orthopedic aftercare: Secondary | ICD-10-CM | POA: Diagnosis not present

## 2017-07-05 DIAGNOSIS — N179 Acute kidney failure, unspecified: Secondary | ICD-10-CM | POA: Diagnosis not present

## 2017-07-05 DIAGNOSIS — F0390 Unspecified dementia without behavioral disturbance: Secondary | ICD-10-CM | POA: Diagnosis not present

## 2017-07-05 DIAGNOSIS — D649 Anemia, unspecified: Secondary | ICD-10-CM | POA: Diagnosis not present

## 2017-07-05 DIAGNOSIS — E119 Type 2 diabetes mellitus without complications: Secondary | ICD-10-CM | POA: Diagnosis not present

## 2017-07-05 DIAGNOSIS — I10 Essential (primary) hypertension: Secondary | ICD-10-CM | POA: Diagnosis not present

## 2017-07-05 DIAGNOSIS — M6281 Muscle weakness (generalized): Secondary | ICD-10-CM | POA: Diagnosis not present

## 2017-07-05 DIAGNOSIS — K5900 Constipation, unspecified: Secondary | ICD-10-CM | POA: Diagnosis not present

## 2017-07-12 DIAGNOSIS — I129 Hypertensive chronic kidney disease with stage 1 through stage 4 chronic kidney disease, or unspecified chronic kidney disease: Secondary | ICD-10-CM | POA: Diagnosis not present

## 2017-07-12 DIAGNOSIS — F329 Major depressive disorder, single episode, unspecified: Secondary | ICD-10-CM | POA: Diagnosis not present

## 2017-07-12 DIAGNOSIS — N183 Chronic kidney disease, stage 3 (moderate): Secondary | ICD-10-CM | POA: Diagnosis not present

## 2017-07-12 DIAGNOSIS — F0150 Vascular dementia without behavioral disturbance: Secondary | ICD-10-CM | POA: Diagnosis not present

## 2017-07-12 DIAGNOSIS — E1122 Type 2 diabetes mellitus with diabetic chronic kidney disease: Secondary | ICD-10-CM | POA: Diagnosis not present

## 2017-07-12 DIAGNOSIS — K5790 Diverticulosis of intestine, part unspecified, without perforation or abscess without bleeding: Secondary | ICD-10-CM | POA: Diagnosis not present

## 2017-07-12 DIAGNOSIS — Z9181 History of falling: Secondary | ICD-10-CM | POA: Diagnosis not present

## 2017-07-12 DIAGNOSIS — D631 Anemia in chronic kidney disease: Secondary | ICD-10-CM | POA: Diagnosis not present

## 2017-07-12 DIAGNOSIS — F0390 Unspecified dementia without behavioral disturbance: Secondary | ICD-10-CM | POA: Diagnosis not present

## 2017-07-12 DIAGNOSIS — E569 Vitamin deficiency, unspecified: Secondary | ICD-10-CM | POA: Diagnosis not present

## 2017-07-12 DIAGNOSIS — S72002D Fracture of unspecified part of neck of left femur, subsequent encounter for closed fracture with routine healing: Secondary | ICD-10-CM | POA: Diagnosis not present

## 2017-07-12 DIAGNOSIS — F419 Anxiety disorder, unspecified: Secondary | ICD-10-CM | POA: Diagnosis not present

## 2017-07-12 DIAGNOSIS — Z7982 Long term (current) use of aspirin: Secondary | ICD-10-CM | POA: Diagnosis not present

## 2017-07-17 ENCOUNTER — Ambulatory Visit (INDEPENDENT_AMBULATORY_CARE_PROVIDER_SITE_OTHER): Payer: PPO | Admitting: Neurology

## 2017-07-17 ENCOUNTER — Encounter: Payer: Self-pay | Admitting: Neurology

## 2017-07-17 VITALS — BP 130/64 | HR 85 | Ht 62.0 in | Wt 168.0 lb

## 2017-07-17 DIAGNOSIS — F0390 Unspecified dementia without behavioral disturbance: Secondary | ICD-10-CM

## 2017-07-17 DIAGNOSIS — F03A0 Unspecified dementia, mild, without behavioral disturbance, psychotic disturbance, mood disturbance, and anxiety: Secondary | ICD-10-CM

## 2017-07-17 NOTE — Patient Instructions (Signed)
1. Continue Namenda 10mg  twice a day  2. Follow-up in 6 months, call for any changes  FALL PRECAUTIONS: Be cautious when walking. Scan the area for obstacles that may increase the risk of trips and falls. When getting up in the mornings, sit up at the edge of the bed for a few minutes before getting out of bed. Consider elevating the bed at the head end to avoid drop of blood pressure when getting up. Walk always in a well-lit room (use night lights in the walls). Avoid area rugs or power cords from appliances in the middle of the walkways. Use a walker or a cane if necessary and consider physical therapy for balance exercise. Get your eyesight checked regularly.  HOME SAFETY: Consider the safety of the kitchen when operating appliances like stoves, microwave oven, and blender. Consider having supervision and share cooking responsibilities until no longer able to participate in those. Accidents with firearms and other hazards in the house should be identified and addressed as well.  DRIVING: Regarding driving, in patients with progressive memory problems, driving will be impaired. We advise to have someone else do the driving if trouble finding directions or if minor accidents are reported. Independent driving assessment is available to determine safety of driving.  ABILITY TO BE LEFT ALONE: If patient is unable to contact 911 operator, consider using LifeLine, or when the need is there, arrange for someone to stay with patients. Smoking is a fire hazard, consider supervision or cessation. Risk of wandering should be assessed by caregiver and if detected at any point, supervision and safe proof recommendations should be instituted.  MEDICATION SUPERVISION: Inability to self-administer medication needs to be constantly addressed. Implement a mechanism to ensure safe administration of the medications.  RECOMMENDATIONS FOR ALL PATIENTS WITH MEMORY PROBLEMS: 1. Continue to exercise (Recommend 30 minutes of  walking everyday, or 3 hours every week) 2. Increase social interactions - continue going to [place] and enjoy social gatherings with friends and family 3. Eat healthy, avoid fried foods and eat more fruits and vegetables 4. Maintain adequate blood pressure, blood sugar, and blood cholesterol level. Reducing the risk of stroke and cardiovascular disease also helps promoting better memory. 5. Avoid stressful situations. Live a simple life and avoid aggravations. Organize your time and prepare for the next day in anticipation. 6. Sleep well, avoid any interruptions of sleep and avoid any distractions in the bedroom that may interfere with adequate sleep quality 7. Avoid sugar, avoid sweets as there is a strong link between excessive sugar intake, diabetes, and cognitive impairment We discussed the Mediterranean diet, which has been shown to help patients reduce the risk of progressive memory disorders and reduces cardiovascular risk. This includes eating fish, eat fruits and green leafy vegetables, nuts like almonds and hazelnuts, walnuts, and also use olive oil. Avoid fast foods and fried foods as much as possible. Avoid sweets and sugar as sugar use has been linked to worsening of memory function.  There is always a concern of gradual progression of memory problems. If this is the case, then we may need to adjust level of care according to patient needs. Support, both to the patient and caregiver, should then be put into place.

## 2017-07-17 NOTE — Progress Notes (Signed)
NEUROLOGY FOLLOW UP OFFICE NOTE  [name] [id number] 03-01-1940  HISTORY OF PRESENT ILLNESS: I had the pleasure of seeing [name] in follow-up in the neurology clinic on 07/17/2017.  The patient was last seen 9 months ago for mild dementia and is again accompanied by her husband who helps supplement the history today.  Records and images were personally reviewed where available.  I personally reviewed MRI brain without contrast done 11/2016 which did not show any acute changes, there was moderate diffuse atrophy and mild chronic microvascular disease. Since her last visit, she had hip replacement surgery last month, and while in the hospital, her daughter passed away from cancer. Her husband has noticed worsening of memory which he does agree appears related to situational depression. She is on Lexapro 10mg  daily. He notices she is better when she is socializing more. She feels her memory is "mediocre." Sleep and appetite are good. Her husband is now administering medications, at one point he would check her pillbox and see 2 days of pills gone. He manages finances, does the cooking and cleaning. She is not driving. She is taking Namenda 10mg  BID without side effects. She has been home from rehab for the past 2 weeks and ambulates with a walker. She denies any headaches, dizziness, vision changes. No further falls since May 2019.  HPI 10/09/2016: This is a 77 yo RH woman with a history of hypertension, hyperlipidemia, diabetes, with mild dementia. She feels her memory is pretty good. She denies getting lost driving. She denies misplacing things, which her husband disagrees with and reminds her about losing her glasses frequently. She denies missing medications. Her husband checks after her and agrees. Her husband is in charge of finances. He started noticing memory changes around 1.5-2 years ago, initially forgetting names, but more recently short-term memory has worsened where she cannot recall  what she had for lunch. Long-term memory is good. He reports that when she is not feeling stressed or has to give the answer right away, she does pretty good. Otherwise she would lock up. She takes care of herself well, no difficulties with dressing and bathing. No personality changes or hallucinations. Her husband reports that their daughter has been in and out of the hospital for the past 1.5-2 years, causing a lot of stress on her. She feels her mood is "okay, I guess." She was initially started on Aricept which caused a rash. She is now on Namenda 10mg  BID without side effects, her husband reports they have not noticed any difference on the medication. Her mother had memory problems. She denies any head injuries, she drinks alcohol on occasion.     PAST MEDICAL HISTORY: Past Medical History:  Diagnosis Date  . Acquired vaginal enterocele 2018   s/p sacrospinal ligament fixation and posterior colporrhaphy Dr. Zigmund Daniel at [facility]  . CKD (chronic kidney disease), stage III ([place]) 06/09/2017  . Dementia    mild mmse 05/2016 (could not remember 3 objects, unable to perform serial 7's)  . Diabetes mellitus without complication ([place])   . Diverticulosis   . Hyperlipidemia   . Hypertension     MEDICATIONS: Current Outpatient Medications on File Prior to Visit  Medication Sig Dispense Refill  . aspirin 81 MG chewable tablet Chew 81 mg by mouth.    . Calcium Carb-Cholecalciferol (CALCIUM 1000 + D PO) Take by mouth daily.    . Cholecalciferol (VITAMIN D) 2000 UNITS CAPS Take 2,000 Units by mouth daily.    Marland Kitchen enoxaparin (  LOVENOX) 40 MG/0.4ML injection Inject 0.4 mLs (40 mg total) into the skin daily for 30 doses. For 30 days post op for DVT prophylaxis 30 Syringe 0  . escitalopram (LEXAPRO) 10 MG tablet Take 1 tablet (10 mg total) by mouth daily. 90 tablet 3  . ferrous sulfate 325 (65 FE) MG tablet Take 1 tablet (325 mg total) by mouth 2 (two) times daily with a meal.  3  . fish oil-omega-3 fatty acids  1000 MG capsule Take 1,200 mg by mouth daily.    Marland Kitchen glucose blood (ONE TOUCH ULTRA TEST) test strip CHECK BLOOD SUGAR EVERYDAY IN THE MORNING DX - E11.9 100 each 3  . HYDROcodone-acetaminophen (NORCO) 5-325 MG tablet Take 1-2 tablets by mouth every 6 (six) hours as needed for moderate pain. 40 tablet 0  . hydrOXYzine (ATARAX/VISTARIL) 10 MG tablet TAKE 1 TABLET BY MOUTH EVERY DAY 90 tablet 2  . JANUVIA 100 MG tablet TAKE 1 TABLET BY MOUTH EVERY DAY 30 tablet 0  . losartan (COZAAR) 100 MG tablet Take 1 tablet (100 mg total) by mouth daily. 30 tablet 5  . memantine (NAMENDA) 10 MG tablet TAKE 1 TABLET BY MOUTH TWICE A DAY 180 tablet 2  . methocarbamol (ROBAXIN) 500 MG tablet Take 1 tablet (500 mg total) by mouth every 6 (six) hours as needed for muscle spasms. 30 tablet 0  . Multiple Vitamin (MULTIVITAMIN) tablet Take 1 tablet by mouth daily.    . Multiple Vitamins-Minerals (ICAPS AREDS 2 PO) Take 1 capsule by mouth daily.    Marland Kitchen omega-3 acid ethyl esters (LOVAZA) 1 g capsule Take 1 capsule (1 g total) by mouth daily. 30 capsule 0  . omeprazole (PRILOSEC) 40 MG capsule Take 1 capsule (40 mg total) by mouth every morning. 90 capsule 3  . polyethylene glycol (MIRALAX / GLYCOLAX) packet Take 17 g by mouth 2 (two) times daily. 14 each 0  . pravastatin (PRAVACHOL) 40 MG tablet TAKE 1 TABLET BY MOUTH AT BEDTIME 90 tablet 3  . senna (SENOKOT) 8.6 MG TABS tablet Take 1 tablet (8.6 mg total) by mouth at bedtime. 120 each 0   No current facility-administered medications on file prior to visit.     ALLERGIES: Allergies  Allergen Reactions  . Aricept [Donepezil Hcl]   . Bactrim [Sulfamethoxazole-Trimethoprim]   . Flagyl [Metronidazole] Hives  . Lisinopril     hyperkalemia    FAMILY HISTORY: Family History  Problem Relation Age of Onset  . CAD Mother   . Heart attack Father     SOCIAL HISTORY: Social History   Socioeconomic History  . Marital status: Married    Spouse name: Not on file  .  Number of children: Not on file  . Years of education: Not on file  . Highest education level: Not on file  Occupational History  . Not on file  Social Needs  . Financial resource strain: Not on file  . Food insecurity:    Worry: Not on file    Inability: Not on file  . Transportation needs:    Medical: Not on file    Non-medical: Not on file  Tobacco Use  . Smoking status: Never Smoker  . Smokeless tobacco: Never Used  Substance and Sexual Activity  . Alcohol use: Yes    Alcohol/week: 1.2 oz    Types: 2 Shots of liquor per week    Comment: 2 drinks of gin a day  . Drug use: No  . Sexual activity: Yes  Lifestyle  .  Physical activity:    Days per week: Not on file    Minutes per session: Not on file  . Stress: Not on file  Relationships  . Social connections:    Talks on phone: Not on file    Gets together: Not on file    Attends religious service: Not on file    Active member of club or organization: Not on file    Attends meetings of clubs or organizations: Not on file    Relationship status: Not on file  . Intimate partner violence:    Fear of current or ex partner: Not on file    Emotionally abused: Not on file    Physically abused: Not on file    Forced sexual activity: Not on file  Other Topics Concern  . Not on file  Social History Narrative  . Not on file    REVIEW OF SYSTEMS: Constitutional: No fevers, chills, or sweats, no generalized fatigue, change in appetite Eyes: No visual changes, double vision, eye pain Ear, nose and throat: No hearing loss, ear pain, nasal congestion, sore throat Cardiovascular: No chest pain, palpitations Respiratory:  No shortness of breath at rest or with exertion, wheezes GastrointestinaI: No nausea, vomiting, diarrhea, abdominal pain, fecal incontinence Genitourinary:  No dysuria, urinary retention or frequency Musculoskeletal:  No neck pain, back pain, +left hip pain Integumentary: No rash, pruritus, skin  lesions Neurological: as above Psychiatric: No depression, insomnia, anxiety Endocrine: No palpitations, fatigue, diaphoresis, mood swings, change in appetite, change in weight, increased thirst Hematologic/Lymphatic:  No anemia, purpura, petechiae. Allergic/Immunologic: no itchy/runny eyes, nasal congestion, recent allergic reactions, rashes  PHYSICAL EXAM: Vitals:   07/17/17 0847  BP: 130/64  Pulse: 85  SpO2: 96%   General: No acute distress Head:  Normocephalic/atraumatic Neck: supple, no paraspinal tenderness, full range of motion Heart:  Regular rate and rhythm Lungs:  Clear to auscultation bilaterally Back: No paraspinal tenderness Skin/Extremities: No rash, no edema Neurological Exam: alert and oriented to person, place. No aphasia or dysarthria. Fund of knowledge is reduced.  Recent and remote memory are impaired.  Attention and concentration are normal.    Able to name objects and repeat phrases. CDT 4/5 MMSE - Mini Mental State Exam 07/17/2017  Orientation to time 0  Orientation to Place 4  Registration 3  Attention/ Calculation 5  Recall 0  Language- name 2 objects 2  Language- repeat 1  Language- follow 3 step command 2  Language- read & follow direction 1  Write a sentence 1  Copy design 1  Total score 20   Cranial nerves: Pupils equal, round, reactive to light.  Extraocular movements intact with no nystagmus. Visual fields full. Facial sensation intact. No facial asymmetry. Tongue, uvula, palate midline.  Motor: Bulk and tone normal, muscle strength 5/5 throughout except for mild difficulty with left hip flexion due to pain, no pronator drift.  Sensation to light touch intact.  No extinction to double simultaneous stimulation.  Deep tendon reflexes +1 throughout, toes downgoing.  Finger to nose testing intact.  Gait slow and cautious favoring left leg, using walker, no ataxia.  IMPRESSION: This is a 77 yo RH woman with vascular risk factors including hypertension,  hyperlipidemia, diabetes, and mild dementia. MMSE today 20/30. She had side effects on Aricept and is taking Namenda 10mg  BID without side effects. Her husband has not noticed significant memory change except did notice more changes with increased stress from daughter's recent passing while she was in the hospital herself  for hip surgery. MRI brain no acute changes. Continue Namenda 10mg  BID. We discussed continuing to monitor mood, potentially increasing Lexapro dose if needed. She is not driving. We again discussed the importance of control of vascular risk factors, physical exercise, and brain stimulation exercises for brain health. She will follow-up in 6 months and knows to call for any changes  Thank you for allowing me to participate in her care.  Please do not hesitate to call for any questions or concerns.  The duration of this appointment visit was 30 minutes of face-to-face time with the patient.  Greater than 50% of this time was spent in counseling, explanation of diagnosis, planning of further management, and coordination of care.   Ellouise Newer, M.D.   CC: Dr. Dennard Schaumann

## 2017-07-21 ENCOUNTER — Other Ambulatory Visit: Payer: Self-pay | Admitting: Family Medicine

## 2017-07-29 DIAGNOSIS — S72142D Displaced intertrochanteric fracture of left femur, subsequent encounter for closed fracture with routine healing: Secondary | ICD-10-CM | POA: Diagnosis not present

## 2017-08-19 ENCOUNTER — Encounter: Payer: Self-pay | Admitting: Physician Assistant

## 2017-08-19 ENCOUNTER — Other Ambulatory Visit: Payer: Self-pay | Admitting: Family Medicine

## 2017-08-19 ENCOUNTER — Ambulatory Visit (INDEPENDENT_AMBULATORY_CARE_PROVIDER_SITE_OTHER): Payer: PPO | Admitting: Physician Assistant

## 2017-08-19 VITALS — BP 138/74 | HR 76 | Temp 98.4°F | Resp 14 | Ht 62.0 in | Wt 174.4 lb

## 2017-08-19 DIAGNOSIS — L02213 Cutaneous abscess of chest wall: Secondary | ICD-10-CM | POA: Diagnosis not present

## 2017-08-19 MED ORDER — SITAGLIPTIN PHOSPHATE 100 MG PO TABS: 100.0000 mg | ORAL_TABLET | Freq: Every day | ORAL | 1 refills | Status: DC

## 2017-08-19 MED ORDER — DOXYCYCLINE HYCLATE 100 MG PO TABS: 100.0000 mg | ORAL_TABLET | Freq: Two times a day (BID) | ORAL | 0 refills | Status: DC

## 2017-08-19 NOTE — Addendum Note (Signed)
Addended by: Sheral Flow on: 08/19/2017 04:52 PM   Modules accepted: Orders

## 2017-08-19 NOTE — Progress Notes (Signed)
Patient ID: [name] MRN: [id number], DOB: [birth year], 77 y.o. Date of Encounter: 08/19/2017, 4:30 PM    Chief Complaint:  Chief Complaint  Patient presents with  . abscess under left breast     HPI: 77 y.o. year old female presents with above.   Husband accompanies her for visit today.  History is obtained from both of them.  I did note the patient has history of mild dementia. They report that she has never had an abscess or similar skin lesion in this area before.  They report that she has never had abscess or similar skin lesion on any other area of her skin in the past either.   Husband does report that she had a cyst removed in the past--- he is not certain if it was at this site or not and patient does not remember.  They report that all of a sudden out of nowhere she developed a bump/abscess in this site over the weekend.  Applied bandage.  When they removed the bandage, it was covered in purulent drainage.  Patient reports that it has a foul odor.  No fevers or chills.  No other concerns to address today.  Home Meds:   Outpatient Medications Prior to Visit  Medication Sig Dispense Refill  . aspirin 81 MG chewable tablet Chew 81 mg by mouth.    . Calcium Carb-Cholecalciferol (CALCIUM 1000 + D PO) Take by mouth daily.    . Cholecalciferol (VITAMIN D) 2000 UNITS CAPS Take 2,000 Units by mouth daily.    Marland Kitchen escitalopram (LEXAPRO) 10 MG tablet Take 1 tablet (10 mg total) by mouth daily. 90 tablet 3  . ferrous sulfate 325 (65 FE) MG tablet Take 1 tablet (325 mg total) by mouth 2 (two) times daily with a meal.  3  . fish oil-omega-3 fatty acids 1000 MG capsule Take 1,200 mg by mouth daily.    Marland Kitchen glucose blood (ONE TOUCH ULTRA TEST) test strip CHECK BLOOD SUGAR EVERYDAY IN THE MORNING DX - E11.9 100 each 3  . HYDROcodone-acetaminophen (NORCO) 5-325 MG tablet Take 1-2 tablets by mouth every 6 (six) hours as needed for moderate pain. 40 tablet 0  . hydrOXYzine (ATARAX/VISTARIL)  10 MG tablet TAKE 1 TABLET BY MOUTH EVERY DAY 90 tablet 2  . losartan (COZAAR) 100 MG tablet Take 1 tablet (100 mg total) by mouth daily. 30 tablet 5  . memantine (NAMENDA) 10 MG tablet TAKE 1 TABLET BY MOUTH TWICE A DAY 180 tablet 2  . methocarbamol (ROBAXIN) 500 MG tablet Take 1 tablet (500 mg total) by mouth every 6 (six) hours as needed for muscle spasms. 30 tablet 0  . Multiple Vitamin (MULTIVITAMIN) tablet Take 1 tablet by mouth daily.    . Multiple Vitamins-Minerals (ICAPS AREDS 2 PO) Take 1 capsule by mouth daily.    Marland Kitchen omega-3 acid ethyl esters (LOVAZA) 1 g capsule Take 1 capsule (1 g total) by mouth daily. 30 capsule 0  . omeprazole (PRILOSEC) 40 MG capsule Take 1 capsule (40 mg total) by mouth every morning. 90 capsule 3  . polyethylene glycol (MIRALAX / GLYCOLAX) packet Take 17 g by mouth 2 (two) times daily. 14 each 0  . pravastatin (PRAVACHOL) 40 MG tablet TAKE 1 TABLET BY MOUTH AT BEDTIME 90 tablet 3  . senna (SENOKOT) 8.6 MG TABS tablet Take 1 tablet (8.6 mg total) by mouth at bedtime. 120 each 0  . sitaGLIPtin (JANUVIA) 100 MG tablet Take 1 tablet (100 mg total)  by mouth daily. 90 tablet 1  . enoxaparin (LOVENOX) 40 MG/0.4ML injection Inject 0.4 mLs (40 mg total) into the skin daily for 30 doses. For 30 days post op for DVT prophylaxis 30 Syringe 0   No facility-administered medications prior to visit.     Allergies:  Allergies  Allergen Reactions  . Aricept [Donepezil Hcl]   . Bactrim [Sulfamethoxazole-Trimethoprim]   . Flagyl [Metronidazole] Hives  . Lisinopril     hyperkalemia      Review of Systems: See HPI for pertinent ROS. All other ROS negative.    Physical Exam: Blood pressure 138/74, pulse 76, temperature 98.4 F (36.9 C), temperature source Oral, resp. rate 14, height 5\' 2"  (1.575 m), weight 79.1 kg (174 lb 6.4 oz), SpO2 96 %., Body mass index is 31.9 kg/m. General: WNWD WF.  Appears in no acute distress. Neck: Supple. No thyromegaly. No  lymphadenopathy. Lungs: Clear bilaterally to auscultation without wheezes, rales, or rhonchi. Breathing is unlabored. Heart: Regular rhythm. No murmurs, rubs, or gallops. Msk:  Strength and tone normal for age. Skin: Underside of left breast---medial aspect----there is ~ 1cm area of mild firmness. In the center of this there is an opening.  Palpated area and got out all drainage that is present.  There is an opening that is approximate 3 mm diameter and 3 mm depth.   Appearance consistent with site of a prior cyst or crypt. Neuro: Alert and oriented X 3. Moves all extremities spontaneously. Gait is normal. CNII-XII grossly in tact. Psych:  Responds to questions appropriately with a normal affect.     ASSESSMENT AND PLAN:  77 y.o. year old female with   1. Abscess of chest wall Allergy list includes Bactrim.  Therefore avoid Bactrim. Will start doxycycline empirically.  We will send wound culture and follow-up. She is to take the doxycycline as directed.  Follow-up if site worsens or if it does not heal/resolve with this. - doxycycline (VIBRA-TABS) 100 MG tablet; Take 1 tablet (100 mg total) by mouth 2 (two) times daily.  Dispense: 20 tablet; Refill: 0 - WOUND CULTURE   Signed, [address] [place], [place], [facility] 08/19/2017 4:30 PM

## 2017-08-22 LAB — WOUND CULTURE
MICRO NUMBER:: 90893394
SPECIMEN QUALITY:: ADEQUATE

## 2017-09-09 ENCOUNTER — Ambulatory Visit: Payer: PPO | Admitting: Neurology

## 2017-09-20 ENCOUNTER — Ambulatory Visit (INDEPENDENT_AMBULATORY_CARE_PROVIDER_SITE_OTHER): Payer: PPO | Admitting: Family Medicine

## 2017-09-20 ENCOUNTER — Encounter: Payer: Self-pay | Admitting: Family Medicine

## 2017-09-20 VITALS — BP 126/68 | HR 80 | Temp 98.4°F | Resp 16 | Ht 61.5 in | Wt 170.0 lb

## 2017-09-20 DIAGNOSIS — Z23 Encounter for immunization: Secondary | ICD-10-CM | POA: Diagnosis not present

## 2017-09-20 DIAGNOSIS — N6082 Other benign mammary dysplasias of left breast: Secondary | ICD-10-CM | POA: Diagnosis not present

## 2017-09-20 NOTE — Progress Notes (Signed)
Subjective:    Patient ID: [name], female    DOB: 1940/09/11, 77 y.o.   MRN: [id number]  Patient recently saw my partner and was diagnosed with an abscess of the chest wall and was treated with doxycycline.  This was a month ago.  The area continues to drain.  There is a 1 cm opening on the left breast oozing foul-smelling white material similar to the material seen with a sebaceous cyst.  Medial to the opening there is a 2 cm x 1 cm fluctuant area that I believe is a cyst sac tunneling really towards the sternum. Past Medical History:  Diagnosis Date  . Acquired vaginal enterocele 2018   s/p sacrospinal ligament fixation and posterior colporrhaphy Dr. Zigmund Daniel at [facility]  . CKD (chronic kidney disease), stage III ([place]) 06/09/2017  . Dementia    mild mmse 05/2016 (could not remember 3 objects, unable to perform serial 7's)  . Diabetes mellitus without complication ([place])   . Diverticulosis   . Hyperlipidemia   . Hypertension    Past Surgical History:  Procedure Laterality Date  . ABDOMINAL HYSTERECTOMY    . bladder tack    . CHOLECYSTECTOMY    . COLONOSCOPY N/A 05/18/2013   Procedure: COLONOSCOPY;  Surgeon: Wonda Horner, MD;  Location: [facility];  Service: Endoscopy;  Laterality: N/A;  . INTRAMEDULLARY (IM) NAIL INTERTROCHANTERIC Left 06/08/2017   Procedure: INTRAMEDULLARY (IM) NAIL LEFT HIP;  Surgeon: Renette Butters, MD;  Location: [facility];  Service: Orthopedics;  Laterality: Left;   Current Outpatient Medications on File Prior to Visit  Medication Sig Dispense Refill  . aspirin 81 MG chewable tablet Chew 81 mg by mouth.    . Calcium Carb-Cholecalciferol (CALCIUM 1000 + D PO) Take by mouth daily.    . Cholecalciferol (VITAMIN D) 2000 UNITS CAPS Take 2,000 Units by mouth daily.    Marland Kitchen escitalopram (LEXAPRO) 10 MG tablet Take 1 tablet (10 mg total) by mouth daily. 90 tablet 3  . ferrous sulfate 325 (65 FE) MG tablet Take 1 tablet (325 mg total) by mouth 2 (two) times daily  with a meal.  3  . fish oil-omega-3 fatty acids 1000 MG capsule Take 1,200 mg by mouth daily.    Marland Kitchen glucose blood (ONE TOUCH ULTRA TEST) test strip CHECK BLOOD SUGAR EVERYDAY IN THE MORNING DX - E11.9 100 each 3  . hydrOXYzine (ATARAX/VISTARIL) 10 MG tablet TAKE 1 TABLET BY MOUTH EVERY DAY 90 tablet 2  . losartan (COZAAR) 100 MG tablet Take 1 tablet (100 mg total) by mouth daily. 30 tablet 5  . memantine (NAMENDA) 10 MG tablet TAKE 1 TABLET BY MOUTH TWICE A DAY 180 tablet 2  . methocarbamol (ROBAXIN) 500 MG tablet Take 1 tablet (500 mg total) by mouth every 6 (six) hours as needed for muscle spasms. 30 tablet 0  . Multiple Vitamin (MULTIVITAMIN) tablet Take 1 tablet by mouth daily.    . Multiple Vitamins-Minerals (ICAPS AREDS 2 PO) Take 1 capsule by mouth daily.    Marland Kitchen omega-3 acid ethyl esters (LOVAZA) 1 g capsule Take 1 capsule (1 g total) by mouth daily. 30 capsule 0  . omeprazole (PRILOSEC) 40 MG capsule Take 1 capsule (40 mg total) by mouth every morning. 90 capsule 3  . polyethylene glycol (MIRALAX / GLYCOLAX) packet Take 17 g by mouth 2 (two) times daily. 14 each 0  . pravastatin (PRAVACHOL) 40 MG tablet TAKE 1 TABLET BY MOUTH AT BEDTIME 90 tablet 3  .  senna (SENOKOT) 8.6 MG TABS tablet Take 1 tablet (8.6 mg total) by mouth at bedtime. 120 each 0  . sitaGLIPtin (JANUVIA) 100 MG tablet Take 1 tablet (100 mg total) by mouth daily. 90 tablet 1  . enoxaparin (LOVENOX) 40 MG/0.4ML injection Inject 0.4 mLs (40 mg total) into the skin daily for 30 doses. For 30 days post op for DVT prophylaxis 30 Syringe 0   No current facility-administered medications on file prior to visit.    Allergies  Allergen Reactions  . Aricept [Donepezil Hcl]   . Bactrim [Sulfamethoxazole-Trimethoprim]   . Flagyl [Metronidazole] Hives  . Lisinopril     hyperkalemia   Social History   Socioeconomic History  . Marital status: Married    Spouse name: Not on file  . Number of children: Not on file  . Years of  education: Not on file  . Highest education level: Not on file  Occupational History  . Not on file  Social Needs  . Financial resource strain: Not on file  . Food insecurity:    Worry: Not on file    Inability: Not on file  . Transportation needs:    Medical: Not on file    Non-medical: Not on file  Tobacco Use  . Smoking status: Never Smoker  . Smokeless tobacco: Never Used  Substance and Sexual Activity  . Alcohol use: Yes    Alcohol/week: 2.0 standard drinks    Types: 2 Shots of liquor per week    Comment: 2 drinks of gin a day  . Drug use: No  . Sexual activity: Yes  Lifestyle  . Physical activity:    Days per week: Not on file    Minutes per session: Not on file  . Stress: Not on file  Relationships  . Social connections:    Talks on phone: Not on file    Gets together: Not on file    Attends religious service: Not on file    Active member of club or organization: Not on file    Attends meetings of clubs or organizations: Not on file    Relationship status: Not on file  . Intimate partner violence:    Fear of current or ex partner: Not on file    Emotionally abused: Not on file    Physically abused: Not on file    Forced sexual activity: Not on file  Other Topics Concern  . Not on file  Social History Narrative  . Not on file      Review of Systems  Skin: Positive for rash.  All other systems reviewed and are negative.      Objective:   Physical Exam  Constitutional: She appears well-developed and well-nourished.  Cardiovascular: Normal rate, regular rhythm and normal heart sounds.  Pulmonary/Chest: Effort normal and breath sounds normal. No respiratory distress. She has no wheezes. She has no rales.    Abdominal: Soft. Bowel sounds are normal.  Psychiatric: She has a normal mood and affect. Her behavior is normal. Judgment and thought content normal.  Vitals reviewed.         Assessment & Plan:  Sebaceous cyst of breast, left  This does  not appear to be an abscess but is rather a ruptured sebaceous cyst.  However the cyst cavity tracks medially towards the sternum.  The small opening continues to scab over preventing drainage of the cyst.  Therefore it continues to rupture and drain, rupture and drain and never heal.  Therefore I anesthetized  the area was 0.1 sent lidocaine with epinephrine.  I made a cruciate incision at the opening that was 1 cm x 1 cm.  I then used a pair hemostats probe into the cyst cavity opening the cavity in its entirety.  I expressed some more cyst contents.  I then probed the cavity with Q-tips soaked in hydrogen peroxide.  I then packed the cavity with approximately 3 inches of 1/4 inch iodoform gauze.  Wound care is discussed.  Hopefully the cavity will close to secondary intention and heal.  If not I would recommend a general surgery consultation for excision.

## 2017-09-20 NOTE — Addendum Note (Signed)
Addended by: Shary Decamp B on: 09/20/2017 02:35 PM   Modules accepted: Orders

## 2017-09-27 ENCOUNTER — Ambulatory Visit: Payer: PPO | Admitting: Neurology

## 2017-09-30 DIAGNOSIS — S72142D Displaced intertrochanteric fracture of left femur, subsequent encounter for closed fracture with routine healing: Secondary | ICD-10-CM | POA: Diagnosis not present

## 2017-10-18 ENCOUNTER — Ambulatory Visit: Payer: PPO | Admitting: Neurology

## 2017-10-18 ENCOUNTER — Encounter

## 2017-11-06 ENCOUNTER — Other Ambulatory Visit: Payer: Self-pay | Admitting: Family Medicine

## 2017-11-06 MED ORDER — OMEPRAZOLE 40 MG PO CPDR: 40.0000 mg | DELAYED_RELEASE_CAPSULE | Freq: Every morning | ORAL | 3 refills | Status: DC

## 2017-12-10 ENCOUNTER — Other Ambulatory Visit: Payer: Self-pay | Admitting: Family Medicine

## 2017-12-18 ENCOUNTER — Other Ambulatory Visit: Payer: Self-pay | Admitting: Family Medicine

## 2018-01-06 ENCOUNTER — Ambulatory Visit (INDEPENDENT_AMBULATORY_CARE_PROVIDER_SITE_OTHER): Payer: PPO | Admitting: Family Medicine

## 2018-01-06 ENCOUNTER — Encounter: Payer: Self-pay | Admitting: Family Medicine

## 2018-01-06 VITALS — BP 122/74 | HR 77 | Temp 98.1°F | Resp 16 | Ht 61.5 in | Wt 177.0 lb

## 2018-01-06 DIAGNOSIS — N183 Chronic kidney disease, stage 3 unspecified: Secondary | ICD-10-CM

## 2018-01-06 DIAGNOSIS — Z0000 Encounter for general adult medical examination without abnormal findings: Secondary | ICD-10-CM | POA: Diagnosis not present

## 2018-01-06 DIAGNOSIS — G309 Alzheimer's disease, unspecified: Secondary | ICD-10-CM

## 2018-01-06 DIAGNOSIS — M8580 Other specified disorders of bone density and structure, unspecified site: Secondary | ICD-10-CM | POA: Diagnosis not present

## 2018-01-06 DIAGNOSIS — E119 Type 2 diabetes mellitus without complications: Secondary | ICD-10-CM | POA: Diagnosis not present

## 2018-01-06 DIAGNOSIS — Z1239 Encounter for other screening for malignant neoplasm of breast: Secondary | ICD-10-CM | POA: Diagnosis not present

## 2018-01-06 DIAGNOSIS — F0280 Dementia in other diseases classified elsewhere without behavioral disturbance: Secondary | ICD-10-CM

## 2018-01-06 NOTE — Progress Notes (Signed)
Subjective:    Patient ID: [name], female    DOB: 1940-11-28, 77 y.o.   MRN: [id number]  HPI  05/2016 Patient is here today to discuss memory loss. Has been suffering from mild memory impairment over the past 2 years. However recently symptoms have worsened. She is now having a difficult time finding words. She has a hard time remembering names. She frequently forgets what she intended to do when she walks into a room, etc.  Mini-Mental status exam is performed today. Patient is able to tell me the date and location without difficulty. However it takes her for attempts to remember 3 words. After 3 minutes, she can't remember 0/3 objects.  She is unable to perform serial sevens. She, so he has to be reminded what number to subtract from the next number. She was able to answer 1 number correctly. Clock drawing, she never draws a circle.  She writes the numbers in somewhat of a circular pattern however when I asked her to draw a clock showing 9:30, she writes ":30"next to the 9 and does not draw a short hand or long hand.  At that time, my plan was: Patient has had mild memory loss over the last year or so. She also gets easily confused with medication. However I believe now that the memory loss represents the development of underlying dementia. Mini-Mental Status exam today scores 23 out of 30. Begin Aricept 5 mg a day and increased to 10 mg a day in one month if tolerated. Recheck in 3 months to reassess patient's status. If progressive, add Namenda. More than 25 minutes was spent in direct face-to-face evaluation and consultation with the patient and her husband.  01/06/18 Patient is here today with her husband.  She is here today for complete physical exam.  She denies any concerns.  Her immunizations are listed below Immunization History  Administered Date(s) Administered  . Influenza, High Dose Seasonal PF 11/06/2016  . Influenza,inj,Quad PF,6+ Mos 11/07/2012, 11/30/2013, 11/18/2014,  10/27/2015  . Influenza-Unspecified 01/03/2007, 12/15/2008, 10/31/2009, 12/04/2010, 11/07/2011  . Pneumococcal Conjugate-13 11/30/2013  . Pneumococcal Polysaccharide-23 03/27/2006, 05/15/2013  . Td 03/27/2006  . Tdap 03/27/2006   Patient is adamant that she had her flu shot in August of this year as is her husband.  Therefore here her immunizations are up-to-date.  She is due for mammogram.  She is due for a bone density.  Her last bone density test was in 2016 and revealed osteopenia.  She is taking calcium and vitamin D but we are set to recheck that.  Given her memory loss and her medical comorbidities and her age, we discussed colonoscopies.  At the present time they decline a colonoscopy.  Due to her age she does not require a Pap smear any further Past Medical History:  Diagnosis Date  . Acquired vaginal enterocele 2018   s/p sacrospinal ligament fixation and posterior colporrhaphy Dr. Zigmund Daniel at [facility]  . CKD (chronic kidney disease), stage III ([place]) 06/09/2017  . Dementia    mild mmse 05/2016 (could not remember 3 objects, unable to perform serial 7's)  . Diabetes mellitus without complication ([place])   . Diverticulosis   . Hyperlipidemia   . Hypertension    Past Surgical History:  Procedure Laterality Date  . ABDOMINAL HYSTERECTOMY    . bladder tack    . CHOLECYSTECTOMY    . COLONOSCOPY N/A 05/18/2013   Procedure: COLONOSCOPY;  Surgeon: Wonda Horner, MD;  [facility]: [facility];  Service: Endoscopy;  Laterality: N/A;  . INTRAMEDULLARY (IM) NAIL INTERTROCHANTERIC Left 06/08/2017   Procedure: INTRAMEDULLARY (IM) NAIL LEFT HIP;  Surgeon: Renette Butters, MD;  Location: [facility];  Service: Orthopedics;  Laterality: Left;   Current Outpatient Medications on File Prior to Visit  Medication Sig Dispense Refill  . aspirin 81 MG chewable tablet Chew 81 mg by mouth.    . Calcium Carb-Cholecalciferol (CALCIUM 1000 + D PO) Take by mouth daily.    . Cholecalciferol (VITAMIN D) 2000 UNITS  CAPS Take 2,000 Units by mouth daily.    Marland Kitchen enoxaparin (LOVENOX) 40 MG/0.4ML injection Inject 0.4 mLs (40 mg total) into the skin daily for 30 doses. For 30 days post op for DVT prophylaxis 30 Syringe 0  . escitalopram (LEXAPRO) 10 MG tablet TAKE 1 TABLET BY MOUTH EVERY DAY 90 tablet 3  . ferrous sulfate 325 (65 FE) MG tablet Take 1 tablet (325 mg total) by mouth 2 (two) times daily with a meal.  3  . fish oil-omega-3 fatty acids 1000 MG capsule Take 1,200 mg by mouth daily.    Marland Kitchen glucose blood (ONE TOUCH ULTRA TEST) test strip CHECK BLOOD SUGAR EVERYDAY IN THE MORNING DX - E11.9 100 each 3  . hydrOXYzine (ATARAX/VISTARIL) 10 MG tablet TAKE 1 TABLET BY MOUTH EVERY DAY 90 tablet 2  . losartan (COZAAR) 100 MG tablet Take 1 tablet (100 mg total) by mouth daily. 30 tablet 5  . memantine (NAMENDA) 10 MG tablet TAKE 1 TABLET BY MOUTH TWICE A DAY 180 tablet 2  . methocarbamol (ROBAXIN) 500 MG tablet Take 1 tablet (500 mg total) by mouth every 6 (six) hours as needed for muscle spasms. 30 tablet 0  . Multiple Vitamin (MULTIVITAMIN) tablet Take 1 tablet by mouth daily.    . Multiple Vitamins-Minerals (ICAPS AREDS 2 PO) Take 1 capsule by mouth daily.    Marland Kitchen omega-3 acid ethyl esters (LOVAZA) 1 g capsule Take 1 capsule (1 g total) by mouth daily. 30 capsule 0  . omeprazole (PRILOSEC) 40 MG capsule Take 1 capsule (40 mg total) by mouth every morning. 90 capsule 3  . ONE TOUCH ULTRA TEST test strip CHECK BLOOD SUGAR EVERY DAY IN THE MORNING 100 each 4  . polyethylene glycol (MIRALAX / GLYCOLAX) packet Take 17 g by mouth 2 (two) times daily. 14 each 0  . pravastatin (PRAVACHOL) 40 MG tablet TAKE 1 TABLET BY MOUTH AT BEDTIME 90 tablet 3  . senna (SENOKOT) 8.6 MG TABS tablet Take 1 tablet (8.6 mg total) by mouth at bedtime. 120 each 0  . sitaGLIPtin (JANUVIA) 100 MG tablet Take 1 tablet (100 mg total) by mouth daily. 90 tablet 1   No current facility-administered medications on file prior to visit.    Allergies    Allergen Reactions  . Aricept [Donepezil Hcl]   . Bactrim [Sulfamethoxazole-Trimethoprim]   . Flagyl [Metronidazole] Hives  . Lisinopril     hyperkalemia   Social History   Socioeconomic History  . Marital status: Married    Spouse name: Not on file  . Number of children: Not on file  . Years of education: Not on file  . Highest education level: Not on file  Occupational History  . Not on file  Social Needs  . Financial resource strain: Not on file  . Food insecurity:    Worry: Not on file    Inability: Not on file  . Transportation needs:    Medical: Not on file    Non-medical: Not  on file  Tobacco Use  . Smoking status: Never Smoker  . Smokeless tobacco: Never Used  Substance and Sexual Activity  . Alcohol use: Yes    Alcohol/week: 2.0 standard drinks    Types: 2 Shots of liquor per week    Comment: 2 drinks of gin a day  . Drug use: No  . Sexual activity: Yes  Lifestyle  . Physical activity:    Days per week: Not on file    Minutes per session: Not on file  . Stress: Not on file  Relationships  . Social connections:    Talks on phone: Not on file    Gets together: Not on file    Attends religious service: Not on file    Active member of club or organization: Not on file    Attends meetings of clubs or organizations: Not on file    Relationship status: Not on file  . Intimate partner violence:    Fear of current or ex partner: Not on file    Emotionally abused: Not on file    Physically abused: Not on file    Forced sexual activity: Not on file  Other Topics Concern  . Not on file  Social History Narrative  . Not on file      Review of Systems  All other systems reviewed and are negative.      Objective:   Physical Exam  Constitutional: She is oriented to person, place, and time. She appears well-developed and well-nourished. No distress.  HENT:  Head: Normocephalic and atraumatic.  Right Ear: External ear normal.  Left Ear: External ear  normal.  Nose: Nose normal.  Mouth/Throat: Oropharynx is clear and moist. No oropharyngeal exudate.  Eyes: Pupils are equal, round, and reactive to light. Conjunctivae and EOM are normal. Left eye exhibits no discharge. No scleral icterus.  Neck: Normal range of motion. Neck supple. No JVD present. No tracheal deviation present. No thyromegaly present.  Cardiovascular: Normal rate, regular rhythm and normal heart sounds.  Pulmonary/Chest: Effort normal and breath sounds normal. No stridor. No respiratory distress. She has no wheezes. She has no rales.  Abdominal: Soft. Bowel sounds are normal. She exhibits no distension and no mass. There is no abdominal tenderness. There is no rebound and no guarding.  Musculoskeletal:        General: No edema.  Lymphadenopathy:    She has no cervical adenopathy.  Neurological: She is alert and oriented to person, place, and time. She has normal reflexes. She displays normal reflexes. No cranial nerve deficit. She exhibits normal muscle tone. Coordination normal.  Skin: No rash noted. She is not diaphoretic. No erythema. No pallor.  Psychiatric: She has a normal mood and affect. Her behavior is normal. Judgment and thought content normal.  Vitals reviewed.         Assessment & Plan:  Diabetes mellitus without complication ([place]) - Plan: Hemoglobin A1c, CBC with Differential/Platelet, COMPLETE METABOLIC PANEL WITH GFR, Lipid panel  CKD (chronic kidney disease), stage III ([facility])  Alzheimer's dementia without behavioral disturbance, unspecified timing of dementia onset ([place])  General medical exam  Patient denies any falling.  She denies depression.  Memory loss has remained relatively stable over the last few months.  Patient is currently on Namenda.  I will schedule the patient for her mammogram and her bone density.  Her immunizations are up-to-date.  She is due for fasting lab work to monitor her diabetes including hemoglobin A1c, CMP, and a fasting  lipid panel.  I will also check a CBC

## 2018-01-07 LAB — COMPLETE METABOLIC PANEL WITH GFR
AG RATIO: 1.5 (calc) (ref 1.0–2.5)
ALBUMIN MSPROF: 4 g/dL (ref 3.6–5.1)
ALT: 16 U/L (ref 6–29)
AST: 19 U/L (ref 10–35)
Alkaline phosphatase (APISO): 73 U/L (ref 33–130)
BUN/Creatinine Ratio: 23 (calc) — ABNORMAL HIGH (ref 6–22)
BUN: 25 mg/dL (ref 7–25)
CALCIUM: 9.9 mg/dL (ref 8.6–10.4)
CO2: 26 mmol/L (ref 20–32)
Chloride: 104 mmol/L (ref 98–110)
Creat: 1.09 mg/dL — ABNORMAL HIGH (ref 0.60–0.93)
GFR, Est African American: 57 mL/min/{1.73_m2} — ABNORMAL LOW (ref >=60)
GFR, Est Non African American: 49 mL/min/{1.73_m2} — ABNORMAL LOW (ref >=60)
GLOBULIN: 2.6 g/dL (ref 1.9–3.7)
Glucose, Bld: 106 mg/dL — ABNORMAL HIGH (ref 65–99)
POTASSIUM: 4.5 mmol/L (ref 3.5–5.3)
Sodium: 140 mmol/L (ref 135–146)
Total Bilirubin: 0.9 mg/dL (ref 0.2–1.2)
Total Protein: 6.6 g/dL (ref 6.1–8.1)

## 2018-01-07 LAB — CBC WITH DIFFERENTIAL/PLATELET
ABSOLUTE MONOCYTES: 462 {cells}/uL (ref 200–950)
Basophils Absolute: 41 cells/uL (ref 0–200)
Basophils Relative: 0.6 %
Eosinophils Absolute: 235 cells/uL (ref 15–500)
Eosinophils Relative: 3.4 %
HEMATOCRIT: 38.9 % (ref 35.0–45.0)
HEMOGLOBIN: 13 g/dL (ref 11.7–15.5)
LYMPHS ABS: 1725 {cells}/uL (ref 850–3900)
MCH: 29.4 pg (ref 27.0–33.0)
MCHC: 33.4 g/dL (ref 32.0–36.0)
MCV: 88 fL (ref 80.0–100.0)
MPV: 10.3 fL (ref 7.5–12.5)
Monocytes Relative: 6.7 %
NEUTROS ABS: 4437 {cells}/uL (ref 1500–7800)
Neutrophils Relative %: 64.3 %
PLATELETS: 255 10*3/uL (ref 140–400)
RBC: 4.42 10*6/uL (ref 3.80–5.10)
RDW: 12.7 % (ref 11.0–15.0)
Total Lymphocyte: 25 %
WBC: 6.9 10*3/uL (ref 3.8–10.8)

## 2018-01-07 LAB — HEMOGLOBIN A1C
Hgb A1c MFr Bld: 6 % of total Hgb — ABNORMAL HIGH (ref <5.7)
Mean Plasma Glucose: 126 (calc)
eAG (mmol/L): 7 (calc)

## 2018-01-07 LAB — LIPID PANEL
Cholesterol: 155 mg/dL (ref <200)
HDL: 51 mg/dL (ref >50)
LDL Cholesterol (Calc): 81 mg/dL (calc)
NON-HDL CHOLESTEROL (CALC): 104 mg/dL (ref <130)
TRIGLYCERIDES: 125 mg/dL (ref <150)
Total CHOL/HDL Ratio: 3 (calc) (ref <5.0)

## 2018-01-16 ENCOUNTER — Other Ambulatory Visit: Payer: Self-pay | Admitting: Family Medicine

## 2018-01-30 DIAGNOSIS — H35371 Puckering of macula, right eye: Secondary | ICD-10-CM | POA: Diagnosis not present

## 2018-01-30 DIAGNOSIS — E119 Type 2 diabetes mellitus without complications: Secondary | ICD-10-CM | POA: Diagnosis not present

## 2018-01-30 DIAGNOSIS — H353121 Nonexudative age-related macular degeneration, left eye, early dry stage: Secondary | ICD-10-CM | POA: Diagnosis not present

## 2018-01-30 DIAGNOSIS — H26492 Other secondary cataract, left eye: Secondary | ICD-10-CM | POA: Diagnosis not present

## 2018-01-30 DIAGNOSIS — H353231 Exudative age-related macular degeneration, bilateral, with active choroidal neovascularization: Secondary | ICD-10-CM | POA: Diagnosis not present

## 2018-01-30 DIAGNOSIS — Z961 Presence of intraocular lens: Secondary | ICD-10-CM | POA: Diagnosis not present

## 2018-02-03 DIAGNOSIS — E113211 Type 2 diabetes mellitus with mild nonproliferative diabetic retinopathy with macular edema, right eye: Secondary | ICD-10-CM | POA: Diagnosis not present

## 2018-02-03 DIAGNOSIS — H353131 Nonexudative age-related macular degeneration, bilateral, early dry stage: Secondary | ICD-10-CM | POA: Diagnosis not present

## 2018-02-03 DIAGNOSIS — H43812 Vitreous degeneration, left eye: Secondary | ICD-10-CM | POA: Diagnosis not present

## 2018-02-03 DIAGNOSIS — E113292 Type 2 diabetes mellitus with mild nonproliferative diabetic retinopathy without macular edema, left eye: Secondary | ICD-10-CM | POA: Diagnosis not present

## 2018-02-15 ENCOUNTER — Other Ambulatory Visit: Payer: Self-pay | Admitting: Family Medicine

## 2018-02-25 ENCOUNTER — Ambulatory Visit: Payer: Self-pay | Admitting: Neurology

## 2018-03-05 ENCOUNTER — Other Ambulatory Visit: Payer: Self-pay

## 2018-03-05 ENCOUNTER — Encounter: Payer: Self-pay | Admitting: Neurology

## 2018-03-05 ENCOUNTER — Ambulatory Visit (INDEPENDENT_AMBULATORY_CARE_PROVIDER_SITE_OTHER): Payer: PPO | Admitting: Neurology

## 2018-03-05 VITALS — BP 110/64 | HR 88 | Ht 61.5 in | Wt 181.0 lb

## 2018-03-05 DIAGNOSIS — F0390 Unspecified dementia without behavioral disturbance: Secondary | ICD-10-CM

## 2018-03-05 DIAGNOSIS — F03A0 Unspecified dementia, mild, without behavioral disturbance, psychotic disturbance, mood disturbance, and anxiety: Secondary | ICD-10-CM

## 2018-03-05 MED ORDER — ESCITALOPRAM OXALATE 20 MG PO TABS: 20.0000 mg | ORAL_TABLET | Freq: Every day | ORAL | 3 refills | Status: DC

## 2018-03-05 NOTE — Patient Instructions (Addendum)
1. Increase Lexapro to 20mg  daily. A new prescription has been sent to your pharmacy. 2. Continue Namenda 10mg  twice a day 3. Agree with looking into assisted living where both of you can move later on 4. Follow-up in 6 months, call for any changes  FALL PRECAUTIONS: Be cautious when walking. Scan the area for obstacles that may increase the risk of trips and falls. When getting up in the mornings, sit up at the edge of the bed for a few minutes before getting out of bed. Consider elevating the bed at the head end to avoid drop of blood pressure when getting up. Walk always in a well-lit room (use night lights in the walls). Avoid area rugs or power cords from appliances in the middle of the walkways. Use a walker or a cane if necessary and consider physical therapy for balance exercise. Get your eyesight checked regularly.  HOME SAFETY: Consider the safety of the kitchen when operating appliances like stoves, microwave oven, and blender. Consider having supervision and share cooking responsibilities until no longer able to participate in those. Accidents with firearms and other hazards in the house should be identified and addressed as well.  ABILITY TO BE LEFT ALONE: If patient is unable to contact 911 operator, consider using LifeLine, or when the need is there, arrange for someone to stay with patients. Smoking is a fire hazard, consider supervision or cessation. Risk of wandering should be assessed by caregiver and if detected at any point, supervision and safe proof recommendations should be instituted.  RECOMMENDATIONS FOR ALL PATIENTS WITH MEMORY PROBLEMS: 1. Continue to exercise (Recommend 30 minutes of walking everyday, or 3 hours every week) 2. Increase social interactions - continue going to [place] and enjoy social gatherings with friends and family 3. Eat healthy, avoid fried foods and eat more fruits and vegetables 4. Maintain adequate blood pressure, blood sugar, and blood cholesterol  level. Reducing the risk of stroke and cardiovascular disease also helps promoting better memory. 5. Avoid stressful situations. Live a simple life and avoid aggravations. Organize your time and prepare for the next day in anticipation. 6. Sleep well, avoid any interruptions of sleep and avoid any distractions in the bedroom that may interfere with adequate sleep quality 7. Avoid sugar, avoid sweets as there is a strong link between excessive sugar intake, diabetes, and cognitive impairment The Mediterranean diet has been shown to help patients reduce the risk of progressive memory disorders and reduces cardiovascular risk. This includes eating fish, eat fruits and green leafy vegetables, nuts like almonds and hazelnuts, walnuts, and also use olive oil. Avoid fast foods and fried foods as much as possible. Avoid sweets and sugar as sugar use has been linked to worsening of memory function.  There is always a concern of gradual progression of memory problems. If this is the case, then we may need to adjust level of care according to patient needs. Support, both to the patient and caregiver, should then be put into place.

## 2018-03-05 NOTE — Progress Notes (Signed)
NEUROLOGY FOLLOW UP OFFICE NOTE  [name] [id number] 25-Feb-1940  HISTORY OF PRESENT ILLNESS: I had the pleasure of seeing [name] in follow-up in the neurology clinic on 03/05/2018.  The patient was last seen 8 months ago for mild dementia and is again accompanied by her husband who helps supplement the history today. MMSE 20/30 in June 2019. She is on Memantine 10mg  BID. She had side effects on Donepezil. She is also on Lexapro 10mg  daily for mood. She reports her memory is terrible. She is forgetting words a lot. Her husband reports more difficulties following instructions and increased repetition every 15 minutes. She puts things back from the dishwasher in different places. Her husband administers medications and manages finances. She does not drive. She is independent with dressing and bathing. She does the laundry but needs supervision. Her husband does the cooking. She got more emotional toward the end of the visit and cursed out loud reporting that her husband talks to their friends about her condition, which upsets her. Her husband reports she has become more emotional, but overall mood is pretty level. No paranoia or hallucinations. Sleep is good, no wandering behavior.She denies any headaches, dizziness, vision changes, focal numbness/tingling/weakness, no falls.   HPI 10/09/2016: This is a 78 yo RH woman with a history of hypertension, hyperlipidemia, diabetes, with mild dementia. She feels her memory is pretty good. She denies getting lost driving. She denies misplacing things, which her husband disagrees with and reminds her about losing her glasses frequently. She denies missing medications. Her husband checks after her and agrees. Her husband is in charge of finances. He started noticing memory changes around 1.5-2 years ago, initially forgetting names, but more recently short-term memory has worsened where she cannot recall what she had for lunch. Long-term memory is good. He  reports that when she is not feeling stressed or has to give the answer right away, she does pretty good. Otherwise she would lock up. She takes care of herself well, no difficulties with dressing and bathing. No personality changes or hallucinations. Her husband reports that their daughter has been in and out of the hospital for the past 1.5-2 years, causing a lot of stress on her. She feels her mood is "okay, I guess." She was initially started on Aricept which caused a rash. She is now on Namenda 10mg  BID without side effects, her husband reports they have not noticed any difference on the medication. Her mother had memory problems. She denies any head injuries, she drinks alcohol on occasion.    Diagnostic Data: MRI brain without contrast done 11/2016 did not show any acute changes, there was moderate diffuse atrophy and mild chronic microvascular disease.   PAST MEDICAL HISTORY: Past Medical History:  Diagnosis Date  . Acquired vaginal enterocele 2018   s/p sacrospinal ligament fixation and posterior colporrhaphy Dr. Zigmund Daniel at [facility]  . CKD (chronic kidney disease), stage III ([place]) 06/09/2017  . Dementia ([place])    mild mmse 77/2018 (could not remember 3 objects, unable to perform serial 7's)  . Diabetes mellitus without complication ([place])   . Diverticulosis   . Hyperlipidemia   . Hypertension     MEDICATIONS: Current Outpatient Medications on File Prior to Visit  Medication Sig Dispense Refill  . aspirin 81 MG chewable tablet Chew 81 mg by mouth.    . Calcium Carb-Cholecalciferol (CALCIUM 1000 + D PO) Take by mouth daily.    . Cholecalciferol (VITAMIN D) 2000 UNITS CAPS Take 2,000 Units  by mouth daily.    Marland Kitchen escitalopram (LEXAPRO) 10 MG tablet TAKE 1 TABLET BY MOUTH EVERY DAY 90 tablet 3  . fish oil-omega-3 fatty acids 1000 MG capsule Take 1,200 mg by mouth daily.    Marland Kitchen glucose blood (ONE TOUCH ULTRA TEST) test strip CHECK BLOOD SUGAR EVERYDAY IN THE MORNING DX - E11.9 100 each 3  .  hydrOXYzine (ATARAX/VISTARIL) 10 MG tablet TAKE 1 TABLET BY MOUTH EVERY DAY 90 tablet 2  . JANUVIA 100 MG tablet TAKE 1 TABLET BY MOUTH EVERY DAY 90 tablet 1  . losartan (COZAAR) 100 MG tablet Take 1 tablet (100 mg total) by mouth daily. 30 tablet 5  . memantine (NAMENDA) 10 MG tablet TAKE 1 TABLET BY MOUTH TWICE A DAY 180 tablet 2  . Multiple Vitamin (MULTIVITAMIN) tablet Take 1 tablet by mouth daily.    . Multiple Vitamins-Minerals (ICAPS AREDS 2 PO) Take 1 capsule by mouth daily.    Marland Kitchen omeprazole (PRILOSEC) 40 MG capsule Take 1 capsule (40 mg total) by mouth every morning. 90 capsule 3  . ONE TOUCH ULTRA TEST test strip CHECK BLOOD SUGAR EVERY DAY IN THE MORNING 100 each 4  . pravastatin (PRAVACHOL) 40 MG tablet TAKE 1 TABLET BY MOUTH EVERYDAY AT BEDTIME 90 tablet 3   No current facility-administered medications on file prior to visit.     ALLERGIES: Allergies  Allergen Reactions  . Aricept [Donepezil Hcl]   . Bactrim [Sulfamethoxazole-Trimethoprim]   . Flagyl [Metronidazole] Hives  . Lisinopril     hyperkalemia    FAMILY HISTORY: Family History  Problem Relation Age of Onset  . CAD Mother   . Heart attack Father     SOCIAL HISTORY: Social History   Socioeconomic History  . Marital status: Married    Spouse name: Not on file  . Number of children: Not on file  . Years of education: Not on file  . Highest education level: Not on file  Occupational History  . Not on file  Social Needs  . Financial resource strain: Not on file  . Food insecurity:    Worry: Not on file    Inability: Not on file  . Transportation needs:    Medical: Not on file    Non-medical: Not on file  Tobacco Use  . Smoking status: Never Smoker  . Smokeless tobacco: Never Used  Substance and Sexual Activity  . Alcohol use: Yes    Alcohol/week: 2.0 standard drinks    Types: 2 Shots of liquor per week    Comment: 2 drinks of gin a day  . Drug use: No  . Sexual activity: Yes  Lifestyle  .  Physical activity:    Days per week: Not on file    Minutes per session: Not on file  . Stress: Not on file  Relationships  . Social connections:    Talks on phone: Not on file    Gets together: Not on file    Attends religious service: Not on file    Active member of club or organization: Not on file    Attends meetings of clubs or organizations: Not on file    Relationship status: Not on file  . Intimate partner violence:    Fear of current or ex partner: Not on file    Emotionally abused: Not on file    Physically abused: Not on file    Forced sexual activity: Not on file  Other Topics Concern  . Not on file  Social History Narrative  . Not on file    REVIEW OF SYSTEMS: Constitutional: No fevers, chills, or sweats, no generalized fatigue, change in appetite Eyes: No visual changes, double vision, eye pain Ear, nose and throat: No hearing loss, ear pain, nasal congestion, sore throat Cardiovascular: No chest pain, palpitations Respiratory:  No shortness of breath at rest or with exertion, wheezes GastrointestinaI: No nausea, vomiting, diarrhea, abdominal pain, fecal incontinence Genitourinary:  No dysuria, urinary retention or frequency Musculoskeletal:  No neck pain, back pain Integumentary: No rash, pruritus, skin lesions Neurological: as above Psychiatric: + depression, anxiety. No insomnia Endocrine: No palpitations, fatigue, diaphoresis, mood swings, change in appetite, change in weight, increased thirst Hematologic/Lymphatic:  No anemia, purpura, petechiae. Allergic/Immunologic: no itchy/runny eyes, nasal congestion, recent allergic reactions, rashes  PHYSICAL EXAM: Vitals:   03/05/18 1517  BP: 110/64  Pulse: 88  SpO2: 97%   General: No acute distress Head:  Normocephalic/atraumatic Neck: supple, no paraspinal tenderness, full range of motion Heart:  Regular rate and rhythm Lungs:  Clear to auscultation bilaterally Back: No paraspinal  tenderness Skin/Extremities: No rash, no edema Neurological Exam: alert and oriented to person, city/state. States it is October, close to Christmas, 2019. No aphasia or dysarthria. Fund of knowledge is reduced.  Recent and remote memory are impaired.  Attention and concentration are normal.    Able to name objects and repeat phrases.  MMSE - Mini Mental State Exam 03/05/2018 07/17/2017  Orientation to time 0 0  Orientation to Place 3 4  Registration 3 3  Attention/ Calculation 5 5  Recall 0 0  Language- name 2 objects 2 2  Language- repeat 1 1  Language- follow 3 step command 3 2  Language- read & follow direction 1 1  Write a sentence 1 1  Copy design 1 1  Total score 20 20   Cranial nerves: Pupils equal, round, reactive to light.  Extraocular movements intact with no nystagmus. Visual fields full. Facial sensation intact. No facial asymmetry. Tongue, uvula, palate midline.  Motor: Bulk and tone normal, muscle strength 5/5 throughout except for mild difficulty with left hip flexion due to pain, no pronator drift.  Sensation to light touch intact.  No extinction to double simultaneous stimulation.  Deep tendon reflexes +1 throughout, toes downgoing.  Finger to nose testing intact.  Gait narrow-based and steady, no ataxia.  IMPRESSION: This is a 78 yo RH woman with vascular risk factors including hypertension, hyperlipidemia, diabetes, and mild dementia. MMSE today 20/30 (20/30 in June 2019). Clinically her husband reports gradual progression of symptoms. She had side effects on Aricept and is taking Namenda 10mg  BID without side effects. She was noted to be quite emotional today and upset with her husband, we discussed increasing Lexapro to 20mg  daily. She does not drive. We again discussed the importance of control of vascular risk factors, physical exercise, and brain stimulation exercises for brain health. We discussed planning for the future and looking into ALF/Memory Care, support/resources  provided for husband today. She will follow-up in 6 months and knows to call for any changes  Thank you for allowing me to participate in her care.  Please do not hesitate to call for any questions or concerns.  The duration of this appointment visit was 30 minutes of face-to-face time with the patient.  Greater than 50% of this time was spent in counseling, explanation of diagnosis, planning of further management, and coordination of care.   Ellouise Newer, M.D.   CC: Dr. Dennard Schaumann

## 2018-03-13 ENCOUNTER — Encounter: Payer: Self-pay | Admitting: Family Medicine

## 2018-03-13 ENCOUNTER — Ambulatory Visit (INDEPENDENT_AMBULATORY_CARE_PROVIDER_SITE_OTHER): Payer: PPO | Admitting: Family Medicine

## 2018-03-13 VITALS — BP 110/76 | HR 73 | Temp 98.1°F | Resp 16 | Ht 61.5 in | Wt 182.0 lb

## 2018-03-13 DIAGNOSIS — L821 Other seborrheic keratosis: Secondary | ICD-10-CM

## 2018-03-13 NOTE — Progress Notes (Signed)
Subjective:    Patient ID: [name], female    DOB: 1940-01-24, 78 y.o.   MRN: [id number]  HPI  Patient has a brown wartlike papule on her nasal bridge.  It has been there for more than 2 years.  Patient and her husband want this removed.  Lesion is approximately 5 mm in diameter.  It is well-circumscribed.  It is not bleeding.  It is not painful.  It appears to be a seborrheic keratosis.  Past Medical History:  Diagnosis Date  . Acquired vaginal enterocele 2018   s/p sacrospinal ligament fixation and posterior colporrhaphy Dr. Zigmund Daniel at [facility]  . CKD (chronic kidney disease), stage III ([place]) 06/09/2017  . Dementia ([place])    mild mmse 05/2016 (could not remember 3 objects, unable to perform serial 7's)  . Diabetes mellitus without complication ([place])   . Diverticulosis   . Hyperlipidemia   . Hypertension    Past Surgical History:  Procedure Laterality Date  . ABDOMINAL HYSTERECTOMY    . bladder tack    . CHOLECYSTECTOMY    . COLONOSCOPY N/A 05/18/2013   Procedure: COLONOSCOPY;  Surgeon: Wonda Horner, MD;  Location: [facility];  Service: Endoscopy;  Laterality: N/A;  . INTRAMEDULLARY (IM) NAIL INTERTROCHANTERIC Left 06/08/2017   Procedure: INTRAMEDULLARY (IM) NAIL LEFT HIP;  Surgeon: Renette Butters, MD;  Location: [facility];  Service: Orthopedics;  Laterality: Left;   Current Outpatient Medications on File Prior to Visit  Medication Sig Dispense Refill  . aspirin 81 MG chewable tablet Chew 81 mg by mouth.    . Calcium Carb-Cholecalciferol (CALCIUM 1000 + D PO) Take by mouth daily.    . Cholecalciferol (VITAMIN D) 2000 UNITS CAPS Take 2,000 Units by mouth daily.    Marland Kitchen escitalopram (LEXAPRO) 20 MG tablet Take 1 tablet (20 mg total) by mouth daily. 90 tablet 3  . fish oil-omega-3 fatty acids 1000 MG capsule Take 1,200 mg by mouth daily.    Marland Kitchen glucose blood (ONE TOUCH ULTRA TEST) test strip CHECK BLOOD SUGAR EVERYDAY IN THE MORNING DX - E11.9 100 each 3  . hydrOXYzine  (ATARAX/VISTARIL) 10 MG tablet TAKE 1 TABLET BY MOUTH EVERY DAY 90 tablet 2  . JANUVIA 100 MG tablet TAKE 1 TABLET BY MOUTH EVERY DAY 90 tablet 1  . losartan (COZAAR) 100 MG tablet Take 1 tablet (100 mg total) by mouth daily. 30 tablet 5  . memantine (NAMENDA) 10 MG tablet TAKE 1 TABLET BY MOUTH TWICE A DAY 180 tablet 2  . Multiple Vitamin (MULTIVITAMIN) tablet Take 1 tablet by mouth daily.    . Multiple Vitamins-Minerals (ICAPS AREDS 2 PO) Take 1 capsule by mouth daily.    Marland Kitchen omeprazole (PRILOSEC) 40 MG capsule Take 1 capsule (40 mg total) by mouth every morning. 90 capsule 3  . ONE TOUCH ULTRA TEST test strip CHECK BLOOD SUGAR EVERY DAY IN THE MORNING 100 each 4  . pravastatin (PRAVACHOL) 40 MG tablet TAKE 1 TABLET BY MOUTH EVERYDAY AT BEDTIME 90 tablet 3   No current facility-administered medications on file prior to visit.    Allergies  Allergen Reactions  . Aricept [Donepezil Hcl]   . Bactrim [Sulfamethoxazole-Trimethoprim]   . Flagyl [Metronidazole] Hives  . Lisinopril     hyperkalemia   Social History   Socioeconomic History  . Marital status: Married    Spouse name: Not on file  . Number of children: Not on file  . Years of education: Not on file  .  Highest education level: Not on file  Occupational History  . Not on file  Social Needs  . Financial resource strain: Not on file  . Food insecurity:    Worry: Not on file    Inability: Not on file  . Transportation needs:    Medical: Not on file    Non-medical: Not on file  Tobacco Use  . Smoking status: Never Smoker  . Smokeless tobacco: Never Used  Substance and Sexual Activity  . Alcohol use: Yes    Alcohol/week: 2.0 standard drinks    Types: 2 Shots of liquor per week    Comment: 2 drinks of gin a day  . Drug use: No  . Sexual activity: Yes  Lifestyle  . Physical activity:    Days per week: Not on file    Minutes per session: Not on file  . Stress: Not on file  Relationships  . Social connections:     Talks on phone: Not on file    Gets together: Not on file    Attends religious service: Not on file    Active member of club or organization: Not on file    Attends meetings of clubs or organizations: Not on file    Relationship status: Not on file  . Intimate partner violence:    Fear of current or ex partner: Not on file    Emotionally abused: Not on file    Physically abused: Not on file    Forced sexual activity: Not on file  Other Topics Concern  . Not on file  Social History Narrative  . Not on file      Review of Systems  All other systems reviewed and are negative.      Objective:   Physical Exam  Constitutional: She appears well-developed and well-nourished. No distress.  HENT:  Head: Normocephalic and atraumatic.  Nose: Nose normal.    Mouth/Throat: Oropharynx is clear and moist.  Cardiovascular: Normal rate, regular rhythm and normal heart sounds.  Pulmonary/Chest: Effort normal and breath sounds normal. No respiratory distress. She has no wheezes. She has no rales.  Skin: She is not diaphoretic.  Vitals reviewed.         Assessment & Plan:  Seborrheic keratoses  We discussed options for treatment including excision at a dermatologist office versus excision here versus cryotherapy.  In attempt to minimize scarring, the patient elects cryotherapy.  The lesion was treated with liquid nitrogen cryotherapy for a total of 30 seconds.  Wound care was discussed.  Follow-up if persistent greater than 3 weeks.

## 2018-03-29 ENCOUNTER — Other Ambulatory Visit: Payer: Self-pay | Admitting: Family Medicine

## 2018-06-07 ENCOUNTER — Other Ambulatory Visit: Payer: Self-pay | Admitting: Family Medicine

## 2018-07-05 ENCOUNTER — Other Ambulatory Visit: Payer: Self-pay | Admitting: Family Medicine

## 2018-08-01 ENCOUNTER — Other Ambulatory Visit: Payer: Self-pay | Admitting: Family Medicine

## 2018-08-01 MED ORDER — GLUCOSE BLOOD VI STRP: ORAL_STRIP | 3 refills | Status: DC

## 2018-10-02 ENCOUNTER — Telehealth: Payer: Self-pay | Admitting: Neurology

## 2018-10-02 DIAGNOSIS — K219 Gastro-esophageal reflux disease without esophagitis: Secondary | ICD-10-CM | POA: Diagnosis not present

## 2018-10-02 DIAGNOSIS — E782 Mixed hyperlipidemia: Secondary | ICD-10-CM | POA: Diagnosis not present

## 2018-10-02 DIAGNOSIS — I10 Essential (primary) hypertension: Secondary | ICD-10-CM | POA: Diagnosis not present

## 2018-10-02 DIAGNOSIS — E119 Type 2 diabetes mellitus without complications: Secondary | ICD-10-CM | POA: Diagnosis not present

## 2018-10-02 NOTE — Telephone Encounter (Signed)
Patient's daughter-in-law, Addlee Algeo, called to let Dr. Delice Lesch know the patient has had some life change recently.   1. Her spouse passed away recently.  2. She has moved to a secured assisted living facility, [facility].  3. The patient is not taking her citalopram at this time because the nursing home needs something directly from the doctor that the patient is to take this medication.  4. At the patient's next visit on 10/14/2018, her daughter-in-law requests Dr. Delice Lesch address the patient's insistence on driving directly with the patient. She said the patient may take it better hearing it directly from the doctor.

## 2018-10-02 NOTE — Telephone Encounter (Signed)
Noted. Will show Dr. Delice Lesch on the day of appt.

## 2018-10-09 DIAGNOSIS — F323 Major depressive disorder, single episode, severe with psychotic features: Secondary | ICD-10-CM | POA: Diagnosis not present

## 2018-10-09 DIAGNOSIS — F0151 Vascular dementia with behavioral disturbance: Secondary | ICD-10-CM | POA: Diagnosis not present

## 2018-10-09 DIAGNOSIS — F22 Delusional disorders: Secondary | ICD-10-CM | POA: Diagnosis not present

## 2018-10-14 ENCOUNTER — Encounter: Payer: Self-pay | Admitting: Neurology

## 2018-10-14 ENCOUNTER — Telehealth: Payer: PPO | Admitting: Neurology

## 2018-10-14 ENCOUNTER — Other Ambulatory Visit: Payer: Self-pay

## 2018-10-14 DIAGNOSIS — D649 Anemia, unspecified: Secondary | ICD-10-CM | POA: Diagnosis not present

## 2018-10-14 DIAGNOSIS — F0391 Unspecified dementia with behavioral disturbance: Secondary | ICD-10-CM | POA: Diagnosis not present

## 2018-10-14 DIAGNOSIS — E119 Type 2 diabetes mellitus without complications: Secondary | ICD-10-CM | POA: Diagnosis not present

## 2018-10-14 DIAGNOSIS — E785 Hyperlipidemia, unspecified: Secondary | ICD-10-CM | POA: Diagnosis not present

## 2018-10-14 DIAGNOSIS — D519 Vitamin B12 deficiency anemia, unspecified: Secondary | ICD-10-CM | POA: Diagnosis not present

## 2018-10-14 DIAGNOSIS — E0821 Diabetes mellitus due to underlying condition with diabetic nephropathy: Secondary | ICD-10-CM | POA: Diagnosis not present

## 2018-10-14 DIAGNOSIS — E708 Other disorders of aromatic amino-acid metabolism: Secondary | ICD-10-CM | POA: Diagnosis not present

## 2018-10-15 DIAGNOSIS — M2041 Other hammer toe(s) (acquired), right foot: Secondary | ICD-10-CM | POA: Diagnosis not present

## 2018-10-15 DIAGNOSIS — M2012 Hallux valgus (acquired), left foot: Secondary | ICD-10-CM | POA: Diagnosis not present

## 2018-10-15 DIAGNOSIS — M79675 Pain in left toe(s): Secondary | ICD-10-CM | POA: Diagnosis not present

## 2018-10-15 DIAGNOSIS — B351 Tinea unguium: Secondary | ICD-10-CM | POA: Diagnosis not present

## 2018-10-15 DIAGNOSIS — E1151 Type 2 diabetes mellitus with diabetic peripheral angiopathy without gangrene: Secondary | ICD-10-CM | POA: Diagnosis not present

## 2018-10-16 DIAGNOSIS — R198 Other specified symptoms and signs involving the digestive system and abdomen: Secondary | ICD-10-CM | POA: Diagnosis not present

## 2018-10-16 DIAGNOSIS — F323 Major depressive disorder, single episode, severe with psychotic features: Secondary | ICD-10-CM | POA: Diagnosis not present

## 2018-10-16 DIAGNOSIS — F22 Delusional disorders: Secondary | ICD-10-CM | POA: Diagnosis not present

## 2018-10-17 NOTE — Progress Notes (Signed)
Daughter called on day of visit 10/14/2018 that patient was sick.

## 2018-10-20 ENCOUNTER — Telehealth: Payer: Self-pay | Admitting: Neurology

## 2018-10-20 ENCOUNTER — Other Ambulatory Visit: Payer: Self-pay | Admitting: Family Medicine

## 2018-10-20 NOTE — Telephone Encounter (Signed)
Severn called regarding Aileena. She said that the facility that Janijah is at has changed her medication about 18 days ago from Lexapro to Zoloft and Seroquel. She said they did that without asking Dr. Delice Lesch first. She also has a Virtual appointment scheduled in October with Dr. Delice Lesch and they are needing it sooner due to other medical needs they are needing to have done. Is there a sooner appointment she can have? There looks to be no available appointments.  Please Advise. Thank you

## 2018-10-21 ENCOUNTER — Other Ambulatory Visit: Payer: Self-pay | Admitting: Family Medicine

## 2018-10-22 NOTE — Telephone Encounter (Signed)
Pls let [name] know that she will be put on the cancellation list but right now there is nothing earlier than the end of Oct. Thanks

## 2018-10-22 NOTE — Telephone Encounter (Signed)
[name] states that pt is very sad and depressed. Pt is just "blah". Having the blah episodes more. Definitely declined in the last 72m in the past 44m per [name].  Recent gum infection this past weekend. Currently being tx with ATB.  Sleeping more.  The Seroquel and Zoloft are the only new medication.  [facility] ( pt moved there Aug 31st) is now filling pts medications  Fax [phone]  Could do a virtual visit

## 2018-10-22 NOTE — Telephone Encounter (Signed)
Betsy made aware.  Please put pt on cancellation list. Virtual or in office. Betsy needs at least 30 minutes notice prior to appt.

## 2018-10-23 ENCOUNTER — Telehealth: Payer: Self-pay | Admitting: Family Medicine

## 2018-10-23 DIAGNOSIS — E119 Type 2 diabetes mellitus without complications: Secondary | ICD-10-CM | POA: Diagnosis not present

## 2018-10-23 DIAGNOSIS — M818 Other osteoporosis without current pathological fracture: Secondary | ICD-10-CM | POA: Diagnosis not present

## 2018-10-23 DIAGNOSIS — F0390 Unspecified dementia without behavioral disturbance: Secondary | ICD-10-CM | POA: Diagnosis not present

## 2018-10-23 NOTE — Telephone Encounter (Signed)
No, she does not require prophylaxis for an intramedullary nail.

## 2018-10-23 NOTE — Telephone Encounter (Signed)
Patient is been added to the wait list

## 2018-10-23 NOTE — Telephone Encounter (Signed)
[facility]	[place] called in regards to the pt and wanted to know if she needed antibx prophylaxis since she had "rods" in her leg (4/19)?

## 2018-10-24 NOTE — Telephone Encounter (Signed)
Called and [place] aware

## 2018-10-28 DIAGNOSIS — F0391 Unspecified dementia with behavioral disturbance: Secondary | ICD-10-CM | POA: Diagnosis not present

## 2018-10-28 DIAGNOSIS — E119 Type 2 diabetes mellitus without complications: Secondary | ICD-10-CM | POA: Diagnosis not present

## 2018-10-28 DIAGNOSIS — E785 Hyperlipidemia, unspecified: Secondary | ICD-10-CM | POA: Diagnosis not present

## 2018-10-28 DIAGNOSIS — M818 Other osteoporosis without current pathological fracture: Secondary | ICD-10-CM | POA: Diagnosis not present

## 2018-10-28 DIAGNOSIS — F0390 Unspecified dementia without behavioral disturbance: Secondary | ICD-10-CM | POA: Diagnosis not present

## 2018-10-28 DIAGNOSIS — E0821 Diabetes mellitus due to underlying condition with diabetic nephropathy: Secondary | ICD-10-CM | POA: Diagnosis not present

## 2018-10-30 DIAGNOSIS — R220 Localized swelling, mass and lump, head: Secondary | ICD-10-CM | POA: Diagnosis not present

## 2018-10-30 DIAGNOSIS — R6884 Jaw pain: Secondary | ICD-10-CM | POA: Diagnosis not present

## 2018-10-30 DIAGNOSIS — M818 Other osteoporosis without current pathological fracture: Secondary | ICD-10-CM | POA: Diagnosis not present

## 2018-10-30 DIAGNOSIS — F0390 Unspecified dementia without behavioral disturbance: Secondary | ICD-10-CM | POA: Diagnosis not present

## 2018-10-30 DIAGNOSIS — E119 Type 2 diabetes mellitus without complications: Secondary | ICD-10-CM | POA: Diagnosis not present

## 2018-11-04 DIAGNOSIS — M818 Other osteoporosis without current pathological fracture: Secondary | ICD-10-CM | POA: Diagnosis not present

## 2018-11-04 DIAGNOSIS — F0390 Unspecified dementia without behavioral disturbance: Secondary | ICD-10-CM | POA: Diagnosis not present

## 2018-11-04 DIAGNOSIS — E119 Type 2 diabetes mellitus without complications: Secondary | ICD-10-CM | POA: Diagnosis not present

## 2018-11-06 DIAGNOSIS — E119 Type 2 diabetes mellitus without complications: Secondary | ICD-10-CM | POA: Diagnosis not present

## 2018-11-06 DIAGNOSIS — M818 Other osteoporosis without current pathological fracture: Secondary | ICD-10-CM | POA: Diagnosis not present

## 2018-11-06 DIAGNOSIS — F0390 Unspecified dementia without behavioral disturbance: Secondary | ICD-10-CM | POA: Diagnosis not present

## 2018-11-11 ENCOUNTER — Other Ambulatory Visit: Payer: Self-pay | Admitting: Family Medicine

## 2018-11-11 DIAGNOSIS — E0821 Diabetes mellitus due to underlying condition with diabetic nephropathy: Secondary | ICD-10-CM | POA: Diagnosis not present

## 2018-11-11 DIAGNOSIS — E785 Hyperlipidemia, unspecified: Secondary | ICD-10-CM | POA: Diagnosis not present

## 2018-11-11 DIAGNOSIS — D649 Anemia, unspecified: Secondary | ICD-10-CM | POA: Diagnosis not present

## 2018-11-11 DIAGNOSIS — M818 Other osteoporosis without current pathological fracture: Secondary | ICD-10-CM | POA: Diagnosis not present

## 2018-11-11 DIAGNOSIS — E119 Type 2 diabetes mellitus without complications: Secondary | ICD-10-CM | POA: Diagnosis not present

## 2018-11-11 DIAGNOSIS — F0390 Unspecified dementia without behavioral disturbance: Secondary | ICD-10-CM | POA: Diagnosis not present

## 2018-11-13 DIAGNOSIS — F0390 Unspecified dementia without behavioral disturbance: Secondary | ICD-10-CM | POA: Diagnosis not present

## 2018-11-13 DIAGNOSIS — E119 Type 2 diabetes mellitus without complications: Secondary | ICD-10-CM | POA: Diagnosis not present

## 2018-11-13 DIAGNOSIS — M818 Other osteoporosis without current pathological fracture: Secondary | ICD-10-CM | POA: Diagnosis not present

## 2018-11-18 ENCOUNTER — Other Ambulatory Visit: Payer: Self-pay

## 2018-11-18 ENCOUNTER — Encounter: Payer: Self-pay | Admitting: Neurology

## 2018-11-18 ENCOUNTER — Telehealth (INDEPENDENT_AMBULATORY_CARE_PROVIDER_SITE_OTHER): Payer: PPO | Admitting: Neurology

## 2018-11-18 VITALS — Ht 60.0 in | Wt 180.0 lb

## 2018-11-18 DIAGNOSIS — F0390 Unspecified dementia without behavioral disturbance: Secondary | ICD-10-CM | POA: Diagnosis not present

## 2018-11-18 DIAGNOSIS — E119 Type 2 diabetes mellitus without complications: Secondary | ICD-10-CM | POA: Diagnosis not present

## 2018-11-18 DIAGNOSIS — F03A0 Unspecified dementia, mild, without behavioral disturbance, psychotic disturbance, mood disturbance, and anxiety: Secondary | ICD-10-CM

## 2018-11-18 DIAGNOSIS — M818 Other osteoporosis without current pathological fracture: Secondary | ICD-10-CM | POA: Diagnosis not present

## 2018-11-18 MED ORDER — SERTRALINE HCL 100 MG PO TABS: 100.0000 mg | ORAL_TABLET | Freq: Every day | ORAL | 11 refills | Status: DC

## 2018-11-18 NOTE — Progress Notes (Signed)
Virtual Visit via Video Note The purpose of this virtual visit is to provide medical care while limiting exposure to the novel coronavirus.    Consent was obtained for video visit:  Yes.   Answered questions that patient had about telehealth interaction:  Yes.   I discussed the limitations, risks, security and privacy concerns of performing an evaluation and management service by telemedicine. I also discussed with the patient that there may be a patient responsible charge related to this service. The patient expressed understanding and agreed to proceed.  Pt location: Home Physician Location: office Name of referring provider:  Susy Frizzle, MD I connected with [name] at patients initiation/request on 11/18/2018 at 10:30 AM EDT by video enabled telemedicine application and verified that I am speaking with the correct person using two identifiers. Pt MRN:  [id number] Pt DOB:  November 12, 1940 Video Participants:  [name];  [facility]	[place] (daughter)   History of Present Illness:  The patient was seen as a virtual video visit on 11/18/2018. She was last seen 8 months ago for mild dementia. Her daughter [name] is present for the e-visit to provide additional information. MMSE 20/30 in 02/2018. She is on Memantine 10mg  BID. She had side effects on Donepezil. Since her last visit, her husband got very sick and passed away in 10/10/22. At that time, she moved to assisted living at [facility]. During this stressful time, family noticed she was more forgetful. [name] reports memory problems "very much increased." Family now manages finances. Staff administers medications. She does not drive. Since moving, she is a little more scared and paranoid, no hallucinations. [name] had called our office in September to report that the patient was very sad and depressed, Lexapro was stopped by her physician and Seroquel and Sertraline were started. Seroquel has since been stopped, she is on Sertraline 50mg   daily. Since then, she is less agitated, more calm. She states her memory "could be better." The patient states her mood is fine, sleep is good. She denies any headaches, dizziness, vision changes, focal numbness/tingling/weakness, no falls.   History on Initial Assessment 10/09/2016: This is a 78 yo RH woman with a history of hypertension, hyperlipidemia, diabetes, with mild dementia. She feels her memory is pretty good. She denies getting lost driving. She denies misplacing things, which her husband disagrees with and reminds her about losing her glasses frequently. She denies missing medications. Her husband checks after her and agrees. Her husband is in charge of finances. He started noticing memory changes around 1.5-2 years ago, initially forgetting names, but more recently short-term memory has worsened where she cannot recall what she had for lunch. Long-term memory is good. He reports that when she is not feeling stressed or has to give the answer right away, she does pretty good. Otherwise she would lock up. She takes care of herself well, no difficulties with dressing and bathing. No personality changes or hallucinations. Her husband reports that their daughter has been in and out of the hospital for the past 1.5-2 years, causing a lot of stress on her. She feels her mood is "okay, I guess." She was initially started on Aricept which caused a rash. She is now on Namenda 10mg  BID without side effects, her husband reports they have not noticed any difference on the medication. Her mother had memory problems. She denies any head injuries, she drinks alcohol on occasion.    Diagnostic Data: MRI brain without contrast done 11/2016 did not show any  acute changes, there was moderate diffuse atrophy and mild chronic microvascular disease.     Current Outpatient Medications on File Prior to Visit  Medication Sig Dispense Refill  . aspirin 81 MG chewable tablet Chew 81 mg by mouth.    . Calcium  Carb-Cholecalciferol (CALCIUM 1000 + D PO) Take by mouth daily.    . Cholecalciferol (VITAMIN D) 2000 UNITS CAPS Take 2,000 Units by mouth daily.    Marland Kitchen escitalopram (LEXAPRO) 20 MG tablet Take 1 tablet (20 mg total) by mouth daily. 90 tablet 3  . fish oil-omega-3 fatty acids 1000 MG capsule Take 1,200 mg by mouth daily.    Marland Kitchen glucose blood (ONE TOUCH ULTRA TEST) test strip CHECK BLOOD SUGAR EVERYDAY IN THE MORNING DX - E11.9 100 each 3  . hydrOXYzine (ATARAX/VISTARIL) 10 MG tablet TAKE 1 TABLET BY MOUTH EVERY DAY 90 tablet 2  . JANUVIA 100 MG tablet TAKE 1 TABLET BY MOUTH DAILY FOR DIABETES. 30 tablet 5  . losartan (COZAAR) 100 MG tablet TAKE 1 TABLET BY MOUTH DAILY FOR HYPERTENSION. 30 tablet 5  . memantine (NAMENDA) 10 MG tablet TAKE 1 TABLET BY MOUTH TWO TIMES A DAY FOR DEMENTIA. 60 tablet 0  . Multiple Vitamin (MULTIVITAMIN) tablet Take 1 tablet by mouth daily.    . Multiple Vitamins-Minerals (ICAPS AREDS 2 PO) Take 1 capsule by mouth daily.    Marland Kitchen omeprazole (PRILOSEC) 40 MG capsule TAKE 1 CAPSULE BY MOUTH DAILY. DX: GERD 30 capsule 5  . ONE TOUCH ULTRA TEST test strip CHECK BLOOD SUGAR EVERY DAY IN THE MORNING 100 each 4  . pravastatin (PRAVACHOL) 40 MG tablet TAKE 1 TABLET BY MOUTH DAILY FOR HYPERLIPIDEMIA. 30 tablet 5  . sertraline (ZOLOFT) 50 MG tablet TAKE 1 TABLET BY MOUTH EVERY DAY FOR MOOD 30 tablet 0   No current facility-administered medications on file prior to visit.      Observations/Objective:   Vitals:   11/18/18 0947  Weight: 180 lb (81.6 kg)  Height: 5' (1.524 m)   GEN:  The patient appears stated age and is in NAD.  Neurological examination: Patient is awake, alert, oriented x 2. No aphasia or dysarthria. Reduced fluency, able to follow commands. Remote and recent memory impaired.  [employer] Mental Exam 11/18/2018  Weekday Correct 1  Current year 0  What state are we in? 0  Amount spent 0  Amount left 0  # of Animals 0  5 objects recall 0  Number  series 2  Hour markers 2  Time correct 0  Placed X in triangle correctly 1  Largest Figure 1  Name of female 0  Date back to work 0  Type of work 0  State she lived in 0  Total score 7   Cranial nerves: Extraocular movements intact with no nystagmus. No facial asymmetry. Motor: moves all extremities symmetrically, at least anti-gravity x 4. No incoordination on finger to nose testing. Gait: narrow-based and steady, able to tandem walk adequately. Negative Romberg test.  Assessment and Plan:   This is a 78 yo RH woman with vascular risk factors including hypertension, hyperlipidemia, diabetes, and dementia with progressive decline, now living in ALF. SLUMS score today 7/30 (MMSE 20/30 in 02/2018 and 06/2017). She is on Memantine 10mg  BID (side effects on Donepezil). She is having more mood changes, likely with situational component as well with recent passing of husband and new environment. Recommend increasing Sertraline to 100mg  daily. Continue Memantine 10mg  BID. Continue 24/7 supervision. Follow-up in  6 months, they know to call for any changes   Follow Up Instructions:   -I discussed the assessment and treatment plan with the patient/daughter. The patient/daughter were provided an opportunity to ask questions and all were answered. The patient/daughter agreed with the plan and demonstrated an understanding of the instructions.   The patient/daughter were advised to call back or seek an in-person evaluation if the symptoms worsen or if the condition fails to improve as anticipated.     Cameron Sprang, MD   CC: [name] [phone]

## 2018-11-20 DIAGNOSIS — M818 Other osteoporosis without current pathological fracture: Secondary | ICD-10-CM | POA: Diagnosis not present

## 2018-11-20 DIAGNOSIS — F0390 Unspecified dementia without behavioral disturbance: Secondary | ICD-10-CM | POA: Diagnosis not present

## 2018-11-20 DIAGNOSIS — E119 Type 2 diabetes mellitus without complications: Secondary | ICD-10-CM | POA: Diagnosis not present

## 2018-11-24 DIAGNOSIS — M818 Other osteoporosis without current pathological fracture: Secondary | ICD-10-CM | POA: Diagnosis not present

## 2018-11-24 DIAGNOSIS — E119 Type 2 diabetes mellitus without complications: Secondary | ICD-10-CM | POA: Diagnosis not present

## 2018-11-24 DIAGNOSIS — F0390 Unspecified dementia without behavioral disturbance: Secondary | ICD-10-CM | POA: Diagnosis not present

## 2018-11-25 DIAGNOSIS — D649 Anemia, unspecified: Secondary | ICD-10-CM | POA: Diagnosis not present

## 2018-11-25 DIAGNOSIS — E0821 Diabetes mellitus due to underlying condition with diabetic nephropathy: Secondary | ICD-10-CM | POA: Diagnosis not present

## 2018-11-25 DIAGNOSIS — E785 Hyperlipidemia, unspecified: Secondary | ICD-10-CM | POA: Diagnosis not present

## 2018-11-25 DIAGNOSIS — F0391 Unspecified dementia with behavioral disturbance: Secondary | ICD-10-CM | POA: Diagnosis not present

## 2018-11-26 DIAGNOSIS — F0390 Unspecified dementia without behavioral disturbance: Secondary | ICD-10-CM | POA: Diagnosis not present

## 2018-11-26 DIAGNOSIS — E119 Type 2 diabetes mellitus without complications: Secondary | ICD-10-CM | POA: Diagnosis not present

## 2018-11-26 DIAGNOSIS — M818 Other osteoporosis without current pathological fracture: Secondary | ICD-10-CM | POA: Diagnosis not present

## 2018-12-01 DIAGNOSIS — M818 Other osteoporosis without current pathological fracture: Secondary | ICD-10-CM | POA: Diagnosis not present

## 2018-12-01 DIAGNOSIS — E119 Type 2 diabetes mellitus without complications: Secondary | ICD-10-CM | POA: Diagnosis not present

## 2018-12-01 DIAGNOSIS — F0390 Unspecified dementia without behavioral disturbance: Secondary | ICD-10-CM | POA: Diagnosis not present

## 2018-12-03 DIAGNOSIS — M818 Other osteoporosis without current pathological fracture: Secondary | ICD-10-CM | POA: Diagnosis not present

## 2018-12-03 DIAGNOSIS — F0390 Unspecified dementia without behavioral disturbance: Secondary | ICD-10-CM | POA: Diagnosis not present

## 2018-12-03 DIAGNOSIS — E119 Type 2 diabetes mellitus without complications: Secondary | ICD-10-CM | POA: Diagnosis not present

## 2018-12-04 DIAGNOSIS — F323 Major depressive disorder, single episode, severe with psychotic features: Secondary | ICD-10-CM | POA: Diagnosis not present

## 2018-12-04 DIAGNOSIS — I10 Essential (primary) hypertension: Secondary | ICD-10-CM | POA: Diagnosis not present

## 2018-12-09 DIAGNOSIS — M818 Other osteoporosis without current pathological fracture: Secondary | ICD-10-CM | POA: Diagnosis not present

## 2018-12-09 DIAGNOSIS — E119 Type 2 diabetes mellitus without complications: Secondary | ICD-10-CM | POA: Diagnosis not present

## 2018-12-09 DIAGNOSIS — F0390 Unspecified dementia without behavioral disturbance: Secondary | ICD-10-CM | POA: Diagnosis not present

## 2018-12-11 DIAGNOSIS — M818 Other osteoporosis without current pathological fracture: Secondary | ICD-10-CM | POA: Diagnosis not present

## 2018-12-11 DIAGNOSIS — E119 Type 2 diabetes mellitus without complications: Secondary | ICD-10-CM | POA: Diagnosis not present

## 2018-12-11 DIAGNOSIS — F0390 Unspecified dementia without behavioral disturbance: Secondary | ICD-10-CM | POA: Diagnosis not present

## 2018-12-14 DIAGNOSIS — E119 Type 2 diabetes mellitus without complications: Secondary | ICD-10-CM | POA: Diagnosis not present

## 2018-12-14 DIAGNOSIS — F0390 Unspecified dementia without behavioral disturbance: Secondary | ICD-10-CM | POA: Diagnosis not present

## 2018-12-14 DIAGNOSIS — M818 Other osteoporosis without current pathological fracture: Secondary | ICD-10-CM | POA: Diagnosis not present

## 2018-12-16 DIAGNOSIS — F0390 Unspecified dementia without behavioral disturbance: Secondary | ICD-10-CM | POA: Diagnosis not present

## 2018-12-16 DIAGNOSIS — M818 Other osteoporosis without current pathological fracture: Secondary | ICD-10-CM | POA: Diagnosis not present

## 2018-12-16 DIAGNOSIS — E119 Type 2 diabetes mellitus without complications: Secondary | ICD-10-CM | POA: Diagnosis not present

## 2018-12-23 DIAGNOSIS — E0821 Diabetes mellitus due to underlying condition with diabetic nephropathy: Secondary | ICD-10-CM | POA: Diagnosis not present

## 2018-12-23 DIAGNOSIS — F0390 Unspecified dementia without behavioral disturbance: Secondary | ICD-10-CM | POA: Diagnosis not present

## 2018-12-23 DIAGNOSIS — D649 Anemia, unspecified: Secondary | ICD-10-CM | POA: Diagnosis not present

## 2018-12-23 DIAGNOSIS — E785 Hyperlipidemia, unspecified: Secondary | ICD-10-CM | POA: Diagnosis not present

## 2018-12-23 DIAGNOSIS — F0391 Unspecified dementia with behavioral disturbance: Secondary | ICD-10-CM | POA: Diagnosis not present

## 2018-12-23 DIAGNOSIS — M818 Other osteoporosis without current pathological fracture: Secondary | ICD-10-CM | POA: Diagnosis not present

## 2018-12-23 DIAGNOSIS — E119 Type 2 diabetes mellitus without complications: Secondary | ICD-10-CM | POA: Diagnosis not present

## 2018-12-25 ENCOUNTER — Encounter: Payer: PPO | Admitting: Family Medicine

## 2018-12-25 DIAGNOSIS — E119 Type 2 diabetes mellitus without complications: Secondary | ICD-10-CM | POA: Diagnosis not present

## 2018-12-25 DIAGNOSIS — M818 Other osteoporosis without current pathological fracture: Secondary | ICD-10-CM | POA: Diagnosis not present

## 2018-12-25 DIAGNOSIS — F0390 Unspecified dementia without behavioral disturbance: Secondary | ICD-10-CM | POA: Diagnosis not present

## 2019-01-27 DIAGNOSIS — E785 Hyperlipidemia, unspecified: Secondary | ICD-10-CM | POA: Diagnosis not present

## 2019-01-27 DIAGNOSIS — F0391 Unspecified dementia with behavioral disturbance: Secondary | ICD-10-CM | POA: Diagnosis not present

## 2019-01-27 DIAGNOSIS — D649 Anemia, unspecified: Secondary | ICD-10-CM | POA: Diagnosis not present

## 2019-01-27 DIAGNOSIS — E0821 Diabetes mellitus due to underlying condition with diabetic nephropathy: Secondary | ICD-10-CM | POA: Diagnosis not present

## 2019-02-03 DIAGNOSIS — Z79899 Other long term (current) drug therapy: Secondary | ICD-10-CM | POA: Diagnosis not present

## 2019-02-03 DIAGNOSIS — N390 Urinary tract infection, site not specified: Secondary | ICD-10-CM | POA: Diagnosis not present

## 2019-02-03 DIAGNOSIS — R300 Dysuria: Secondary | ICD-10-CM | POA: Diagnosis not present

## 2019-02-03 DIAGNOSIS — R319 Hematuria, unspecified: Secondary | ICD-10-CM | POA: Diagnosis not present

## 2019-02-05 DIAGNOSIS — N390 Urinary tract infection, site not specified: Secondary | ICD-10-CM | POA: Diagnosis not present

## 2019-02-05 DIAGNOSIS — B372 Candidiasis of skin and nail: Secondary | ICD-10-CM | POA: Diagnosis not present

## 2019-02-12 DIAGNOSIS — N390 Urinary tract infection, site not specified: Secondary | ICD-10-CM | POA: Diagnosis not present

## 2019-02-12 DIAGNOSIS — I10 Essential (primary) hypertension: Secondary | ICD-10-CM | POA: Diagnosis not present

## 2019-02-24 DIAGNOSIS — Z1383 Encounter for screening for respiratory disorder NEC: Secondary | ICD-10-CM | POA: Diagnosis not present

## 2019-02-24 DIAGNOSIS — Z20828 Contact with and (suspected) exposure to other viral communicable diseases: Secondary | ICD-10-CM | POA: Diagnosis not present

## 2019-03-02 DIAGNOSIS — Z20828 Contact with and (suspected) exposure to other viral communicable diseases: Secondary | ICD-10-CM | POA: Diagnosis not present

## 2019-03-02 DIAGNOSIS — Z1383 Encounter for screening for respiratory disorder NEC: Secondary | ICD-10-CM | POA: Diagnosis not present

## 2019-03-03 DIAGNOSIS — E0821 Diabetes mellitus due to underlying condition with diabetic nephropathy: Secondary | ICD-10-CM | POA: Diagnosis not present

## 2019-03-03 DIAGNOSIS — D649 Anemia, unspecified: Secondary | ICD-10-CM | POA: Diagnosis not present

## 2019-03-03 DIAGNOSIS — E785 Hyperlipidemia, unspecified: Secondary | ICD-10-CM | POA: Diagnosis not present

## 2019-03-03 DIAGNOSIS — F0391 Unspecified dementia with behavioral disturbance: Secondary | ICD-10-CM | POA: Diagnosis not present

## 2019-03-10 ENCOUNTER — Encounter: Payer: Self-pay | Admitting: Family Medicine

## 2019-03-10 ENCOUNTER — Other Ambulatory Visit: Payer: Self-pay

## 2019-03-10 ENCOUNTER — Ambulatory Visit (INDEPENDENT_AMBULATORY_CARE_PROVIDER_SITE_OTHER): Payer: PPO | Admitting: Family Medicine

## 2019-03-10 VITALS — BP 100/62 | HR 88 | Temp 97.4°F | Resp 16 | Ht 61.5 in | Wt 184.0 lb

## 2019-03-10 DIAGNOSIS — Z0000 Encounter for general adult medical examination without abnormal findings: Secondary | ICD-10-CM

## 2019-03-10 DIAGNOSIS — Z1231 Encounter for screening mammogram for malignant neoplasm of breast: Secondary | ICD-10-CM | POA: Diagnosis not present

## 2019-03-10 DIAGNOSIS — F0280 Dementia in other diseases classified elsewhere without behavioral disturbance: Secondary | ICD-10-CM | POA: Diagnosis not present

## 2019-03-10 DIAGNOSIS — G309 Alzheimer's disease, unspecified: Secondary | ICD-10-CM

## 2019-03-10 DIAGNOSIS — M8580 Other specified disorders of bone density and structure, unspecified site: Secondary | ICD-10-CM | POA: Diagnosis not present

## 2019-03-10 DIAGNOSIS — E119 Type 2 diabetes mellitus without complications: Secondary | ICD-10-CM | POA: Diagnosis not present

## 2019-03-10 DIAGNOSIS — Z8719 Personal history of other diseases of the digestive system: Secondary | ICD-10-CM | POA: Diagnosis not present

## 2019-03-10 DIAGNOSIS — N183 Chronic kidney disease, stage 3 unspecified: Secondary | ICD-10-CM

## 2019-03-10 NOTE — Progress Notes (Signed)
Subjective:    Patient ID: [name], female    DOB: Jan 27, 1940, 79 y.o.   MRN: [id number]  HPI   05/2016 Patient is here today to discuss memory loss. Has been suffering from mild memory impairment over the past 2 years. However recently symptoms have worsened. She is now having a difficult time finding words. She has a hard time remembering names. She frequently forgets what she intended to do when she walks into a room, etc.  Mini-Mental status exam is performed today. Patient is able to tell me the date and location without difficulty. However it takes her for attempts to remember 3 words. After 3 minutes, she can't remember 0/3 objects.  She is unable to perform serial sevens. She, so he has to be reminded what number to subtract from the next number. She was able to answer 1 number correctly. Clock drawing, she never draws a circle.  She writes the numbers in somewhat of a circular pattern however when I asked her to draw a clock showing 9:30, she writes ":30"next to the 9 and does not draw a short hand or long hand.  At that time, my plan was: Patient has had mild memory loss over the last year or so. She also gets easily confused with medication. However I believe now that the memory loss represents the development of underlying dementia. Mini-Mental Status exam today scores 23 out of 30. Begin Aricept 5 mg a day and increased to 10 mg a day in one month if tolerated. Recheck in 3 months to reassess patient's status. If progressive, add Namenda. More than 25 minutes was spent in direct face-to-face evaluation and consultation with the patient and her husband.  03/10/19 Since I last saw the patient, her husband [place] died from lung cancer October 02, 2018).  She is here for a CPE.  Unfortunately, her dementia has progressed to the point that she is no longer able to live independently.  She has now at [facility].  While discussing the affairs of last year I mentioned her daughter who died from melanoma  and her husband who died from lung cancer.  The patient was unaware that her daughter had passed away.  She had no recollection of this.  She also has forgotten that her mother is deceased as well as her biological father.  I include this only to demonstrate the severity of her memory loss.  She is still very healthy appearing.  She is very active and vibrant.  Her balance is excellent.  She is able to walk and ambulate without any difficulty.  She is not falling.  Unfortunately her memory continues to decline.  I asked the family if she is dealing with any depression.  Apparently that was an issue earlier in the year however her medications were changed at the assisted living facility and she is currently taking Zoloft 100 mg p.o. nightly.  They state that her depression has improved since she has been on this.  Her biggest issue now appears to be the progression in her dementia.  She also relives the death of her family members that she is reminded about their passing.  Last bone density was 2016 which revealed osteopenia.  Last colonoscopy was performed in 2015 by Dr. Penelope Coop showing diverticulosis.  This was due to a lower GI bleed.  Last mammogram was in 2018.  Has had a hysterectomy and does not require a pap smear.   Immunization History  Administered Date(s) Administered  . Influenza, High  Dose Seasonal PF 11/06/2016  . Influenza,inj,Quad PF,6+ Mos 11/07/2012, 11/30/2013, 11/18/2014, 10/27/2015  . Influenza-Unspecified 01/03/2007, 12/15/2008, 10/31/2009, 12/04/2010, 11/07/2011  . Moderna SARS-COVID-2 Vaccination 02/18/2019  . Pneumococcal Conjugate-13 11/30/2013  . Pneumococcal Polysaccharide-23 03/27/2006, 05/15/2013  . Td 03/27/2006  . Tdap 03/27/2006    Past Medical History:  Diagnosis Date  . Abscessed tooth   . Acquired vaginal enterocele 2018   s/p sacrospinal ligament fixation and posterior colporrhaphy Dr. Zigmund Daniel at [facility]  . CKD (chronic kidney disease), stage III 06/09/2017  .  Dementia ([place])    mild mmse 05/2016 (could not remember 3 objects, unable to perform serial 7's)  . Diabetes mellitus without complication ([place])   . Diverticulosis   . Hyperlipidemia   . Hypertension    Past Surgical History:  Procedure Laterality Date  . ABDOMINAL HYSTERECTOMY    . bladder tack    . CHOLECYSTECTOMY    . COLONOSCOPY N/A 05/18/2013   Procedure: COLONOSCOPY;  Surgeon: Wonda Horner, MD;  Location: [facility];  Service: Endoscopy;  Laterality: N/A;  . DENTAL SURGERY    . INTRAMEDULLARY (IM) NAIL INTERTROCHANTERIC Left 06/08/2017   Procedure: INTRAMEDULLARY (IM) NAIL LEFT HIP;  Surgeon: Renette Butters, MD;  Location: [facility];  Service: Orthopedics;  Laterality: Left;   Current Outpatient Medications on File Prior to Visit  Medication Sig Dispense Refill  . aspirin 81 MG chewable tablet Chew 81 mg by mouth.    . Calcium Carb-Cholecalciferol (CALCIUM 1000 + D PO) Take by mouth daily.    . Cholecalciferol (VITAMIN D) 2000 UNITS CAPS Take 2,000 Units by mouth daily.    . fish oil-omega-3 fatty acids 1000 MG capsule Take 1,200 mg by mouth daily.    Marland Kitchen glucose blood (ONE TOUCH ULTRA TEST) test strip CHECK BLOOD SUGAR EVERYDAY IN THE MORNING DX - E11.9 100 each 3  . hydrOXYzine (ATARAX/VISTARIL) 10 MG tablet TAKE 1 TABLET BY MOUTH EVERY DAY 90 tablet 2  . JANUVIA 100 MG tablet TAKE 1 TABLET BY MOUTH DAILY FOR DIABETES. 30 tablet 5  . losartan (COZAAR) 100 MG tablet TAKE 1 TABLET BY MOUTH DAILY FOR HYPERTENSION. 30 tablet 5  . memantine (NAMENDA) 10 MG tablet TAKE 1 TABLET BY MOUTH TWO TIMES A DAY FOR DEMENTIA. 60 tablet 0  . Multiple Vitamin (MULTIVITAMIN) tablet Take 1 tablet by mouth daily.    . Multiple Vitamins-Minerals (ICAPS AREDS 2 PO) Take 1 capsule by mouth daily.    Marland Kitchen omeprazole (PRILOSEC) 40 MG capsule TAKE 1 CAPSULE BY MOUTH DAILY. DX: GERD 30 capsule 5  . ONE TOUCH ULTRA TEST test strip CHECK BLOOD SUGAR EVERY DAY IN THE MORNING 100 each 4  . pravastatin (PRAVACHOL)  40 MG tablet TAKE 1 TABLET BY MOUTH DAILY FOR HYPERLIPIDEMIA. 30 tablet 5  . sertraline (ZOLOFT) 100 MG tablet Take 1 tablet (100 mg total) by mouth daily. 30 tablet 11   No current facility-administered medications on file prior to visit.   Allergies  Allergen Reactions  . Aricept [Donepezil Hcl]   . Bactrim [Sulfamethoxazole-Trimethoprim]   . Flagyl [Metronidazole] Hives  . Lisinopril     hyperkalemia   Social History   Socioeconomic History  . Marital status: Widowed    Spouse name: Not on file  . Number of children: Not on file  . Years of education: Not on file  . Highest education level: Not on file  Occupational History  . Not on file  Tobacco Use  . Smoking status: Never Smoker  .  Smokeless tobacco: Never Used  Substance and Sexual Activity  . Alcohol use: Yes    Alcohol/week: 2.0 standard drinks    Types: 2 Shots of liquor per week    Comment: 2 drinks of gin a day  . Drug use: No  . Sexual activity: Yes  Other Topics Concern  . Not on file  Social History Narrative   Lives in [place]      Right handed      Highest level of edu- some college   Social Determinants of Health   Financial Resource Strain:   . Difficulty of Paying Living Expenses: Not on file  Food Insecurity:   . Worried About Charity fundraiser in the Last Year: Not on file  . Ran Out of Food in the Last Year: Not on file  Transportation Needs:   . Lack of Transportation (Medical): Not on file  . Lack of Transportation (Non-Medical): Not on file  Physical Activity:   . Days of Exercise per Week: Not on file  . Minutes of Exercise per Session: Not on file  Stress:   . Feeling of Stress : Not on file  Social Connections:   . Frequency of Communication with Friends and Family: Not on file  . Frequency of Social Gatherings with Friends and Family: Not on file  . Attends Religious Services: Not on file  . Active Member of Clubs or Organizations: Not on file  .  Attends Archivist Meetings: Not on file  . Marital Status: Not on file  Intimate Partner Violence:   . Fear of Current or Ex-Partner: Not on file  . Emotionally Abused: Not on file  . Physically Abused: Not on file  . Sexually Abused: Not on file      Review of Systems  All other systems reviewed and are negative.      Objective:   Physical Exam Vitals reviewed.  Constitutional:      General: She is not in acute distress.    Appearance: She is well-developed. She is not diaphoretic.  HENT:     Head: Normocephalic and atraumatic.     Right Ear: External ear normal.     Left Ear: External ear normal.     Nose: Nose normal.     Mouth/Throat:     Pharynx: No oropharyngeal exudate.  Eyes:     General: No scleral icterus.       Left eye: No discharge.     Conjunctiva/sclera: Conjunctivae normal.     Pupils: Pupils are equal, round, and reactive to light.  Neck:     Thyroid: No thyromegaly.     Vascular: No JVD.     Trachea: No tracheal deviation.  Cardiovascular:     Rate and Rhythm: Normal rate and regular rhythm.     Heart sounds: Normal heart sounds.  Pulmonary:     Effort: Pulmonary effort is normal. No respiratory distress.     Breath sounds: Normal breath sounds. No stridor. No wheezing or rales.  Abdominal:     General: Bowel sounds are normal. There is no distension.     Palpations: Abdomen is soft. There is no mass.     Tenderness: There is no abdominal tenderness. There is no guarding or rebound.  Musculoskeletal:     Cervical back: Normal range of motion and neck supple.  Lymphadenopathy:     Cervical: No cervical adenopathy.  Skin:    Coloration: Skin is not pale.  Findings: No erythema or rash.  Neurological:     Mental Status: She is alert and oriented to person, place, and time.     Cranial Nerves: No cranial nerve deficit.     Motor: No abnormal muscle tone.     Coordination: Coordination normal.     Deep Tendon Reflexes: Reflexes  are normal and symmetric. Reflexes normal.  Psychiatric:        Behavior: Behavior normal.        Thought Content: Thought content normal.        Judgment: Judgment normal.           Assessment & Plan:  General medical exam  Diabetes mellitus without complication ([place]) - Plan: CBC with Differential/Platelet, COMPLETE METABOLIC PANEL WITH GFR, Lipid panel, Microalbumin, urine, Hemoglobin A1c  Stage 3 chronic kidney disease, unspecified whether stage 3a or 3b CKD  Alzheimer's dementia without behavioral disturbance, unspecified timing of dementia onset (HCC)  History of GI bleed  Encounter for screening mammogram for malignant neoplasm of breast - Plan: MM Digital Screening  Osteopenia, unspecified location - Plan: DG Bone Density  Diabetic foot exam was performed today and is outstanding.  Blood pressure is very low.  I have recommended decreasing losartan from 100 mg a day to 50 mg a day to reduce the risk of dizziness falls.  Unfortunately her dementia has progressed.  I believe the patient likely has Alzheimer's disease although there is a debate that she may have vascular dementia.  Regardless, the patient has moderate dementia and is no longer able to care for herself at home or manage her finances.  She seems to be doing well in her assisted living facility.  Her depression has improved.  I will check a CBC, CMP, fasting lipid panel.  I will gladly accept a hemoglobin A1c between 7 and 8 given her dementia.  Given her possible history of vascular dementia I would like to see her LDL cholesterol well below 100 and is close to 70 as possible to reduce the risk of future strokes.  Given her history of a GI bleed I would also check a CBC to monitor for any anemia.  We discussed preventative care strategies.  We discussed a mammogram.  Both the patient and her daughter-in-law would like to proceed with a mammogram.  I also recommended a bone density given the fact that she injured her hip  last year due to a fall requiring surgery.  She had osteopenia in 2016.  Therefore I will repeat the bone density and treat this aggressively if it has progressed osteoporosis.

## 2019-03-11 DIAGNOSIS — R319 Hematuria, unspecified: Secondary | ICD-10-CM | POA: Diagnosis not present

## 2019-03-11 DIAGNOSIS — N390 Urinary tract infection, site not specified: Secondary | ICD-10-CM | POA: Diagnosis not present

## 2019-03-11 DIAGNOSIS — R4182 Altered mental status, unspecified: Secondary | ICD-10-CM | POA: Diagnosis not present

## 2019-03-11 LAB — COMPLETE METABOLIC PANEL WITH GFR
AG Ratio: 1.6 (calc) (ref 1.0–2.5)
ALT: 48 U/L — ABNORMAL HIGH (ref 6–29)
AST: 36 U/L — ABNORMAL HIGH (ref 10–35)
Albumin: 4.1 g/dL (ref 3.6–5.1)
Alkaline phosphatase (APISO): 76 U/L (ref 37–153)
BUN/Creatinine Ratio: 22 (calc) (ref 6–22)
BUN: 29 mg/dL — ABNORMAL HIGH (ref 7–25)
CO2: 24 mmol/L (ref 20–32)
Calcium: 9.4 mg/dL (ref 8.6–10.4)
Chloride: 105 mmol/L (ref 98–110)
Creat: 1.29 mg/dL — ABNORMAL HIGH (ref 0.60–0.93)
GFR, Est African American: 46 mL/min/{1.73_m2} — ABNORMAL LOW (ref >=60)
GFR, Est Non African American: 40 mL/min/{1.73_m2} — ABNORMAL LOW (ref >=60)
Globulin: 2.5 g/dL (calc) (ref 1.9–3.7)
Glucose, Bld: 134 mg/dL — ABNORMAL HIGH (ref 65–99)
Potassium: 4.5 mmol/L (ref 3.5–5.3)
Sodium: 140 mmol/L (ref 135–146)
Total Bilirubin: 0.5 mg/dL (ref 0.2–1.2)
Total Protein: 6.6 g/dL (ref 6.1–8.1)

## 2019-03-11 LAB — CBC WITH DIFFERENTIAL/PLATELET
Absolute Monocytes: 628 cells/uL (ref 200–950)
Basophils Absolute: 51 cells/uL (ref 0–200)
Basophils Relative: 0.7 %
Eosinophils Absolute: 307 cells/uL (ref 15–500)
Eosinophils Relative: 4.2 %
HCT: 40.6 % (ref 35.0–45.0)
Hemoglobin: 13.4 g/dL (ref 11.7–15.5)
Lymphs Abs: 2059 cells/uL (ref 850–3900)
MCH: 28.3 pg (ref 27.0–33.0)
MCHC: 33 g/dL (ref 32.0–36.0)
MCV: 85.7 fL (ref 80.0–100.0)
MPV: 10 fL (ref 7.5–12.5)
Monocytes Relative: 8.6 %
Neutro Abs: 4256 cells/uL (ref 1500–7800)
Neutrophils Relative %: 58.3 %
Platelets: 259 10*3/uL (ref 140–400)
RBC: 4.74 10*6/uL (ref 3.80–5.10)
RDW: 12.6 % (ref 11.0–15.0)
Total Lymphocyte: 28.2 %
WBC: 7.3 10*3/uL (ref 3.8–10.8)

## 2019-03-11 LAB — LIPID PANEL
Cholesterol: 163 mg/dL (ref <200)
HDL: 40 mg/dL — ABNORMAL LOW (ref >=50)
LDL Cholesterol (Calc): 90 mg/dL (calc)
Non-HDL Cholesterol (Calc): 123 mg/dL (calc) (ref <130)
Total CHOL/HDL Ratio: 4.1 (calc) (ref <5.0)
Triglycerides: 241 mg/dL — ABNORMAL HIGH (ref <150)

## 2019-03-11 LAB — HEMOGLOBIN A1C
Hgb A1c MFr Bld: 6 % of total Hgb — ABNORMAL HIGH (ref <5.7)
Mean Plasma Glucose: 126 (calc)
eAG (mmol/L): 7 (calc)

## 2019-03-13 ENCOUNTER — Encounter: Payer: Self-pay | Admitting: Family Medicine

## 2019-03-24 DIAGNOSIS — E0821 Diabetes mellitus due to underlying condition with diabetic nephropathy: Secondary | ICD-10-CM | POA: Diagnosis not present

## 2019-03-24 DIAGNOSIS — D649 Anemia, unspecified: Secondary | ICD-10-CM | POA: Diagnosis not present

## 2019-03-24 DIAGNOSIS — E785 Hyperlipidemia, unspecified: Secondary | ICD-10-CM | POA: Diagnosis not present

## 2019-03-24 DIAGNOSIS — F0391 Unspecified dementia with behavioral disturbance: Secondary | ICD-10-CM | POA: Diagnosis not present

## 2019-04-09 DIAGNOSIS — K219 Gastro-esophageal reflux disease without esophagitis: Secondary | ICD-10-CM | POA: Diagnosis not present

## 2019-04-09 DIAGNOSIS — F323 Major depressive disorder, single episode, severe with psychotic features: Secondary | ICD-10-CM | POA: Diagnosis not present

## 2019-04-09 DIAGNOSIS — I10 Essential (primary) hypertension: Secondary | ICD-10-CM | POA: Diagnosis not present

## 2019-05-20 IMAGING — DX DG CHEST 1V PORT
1 series · 1 of 1 positions shown · non-contrast
Comparison: Prior radiograph from 08/23/2010.

CLINICAL DATA: Initial preoperative evaluation for left hip
fracture.

EXAM:
PORTABLE CHEST 1 VIEW

[chest ap]
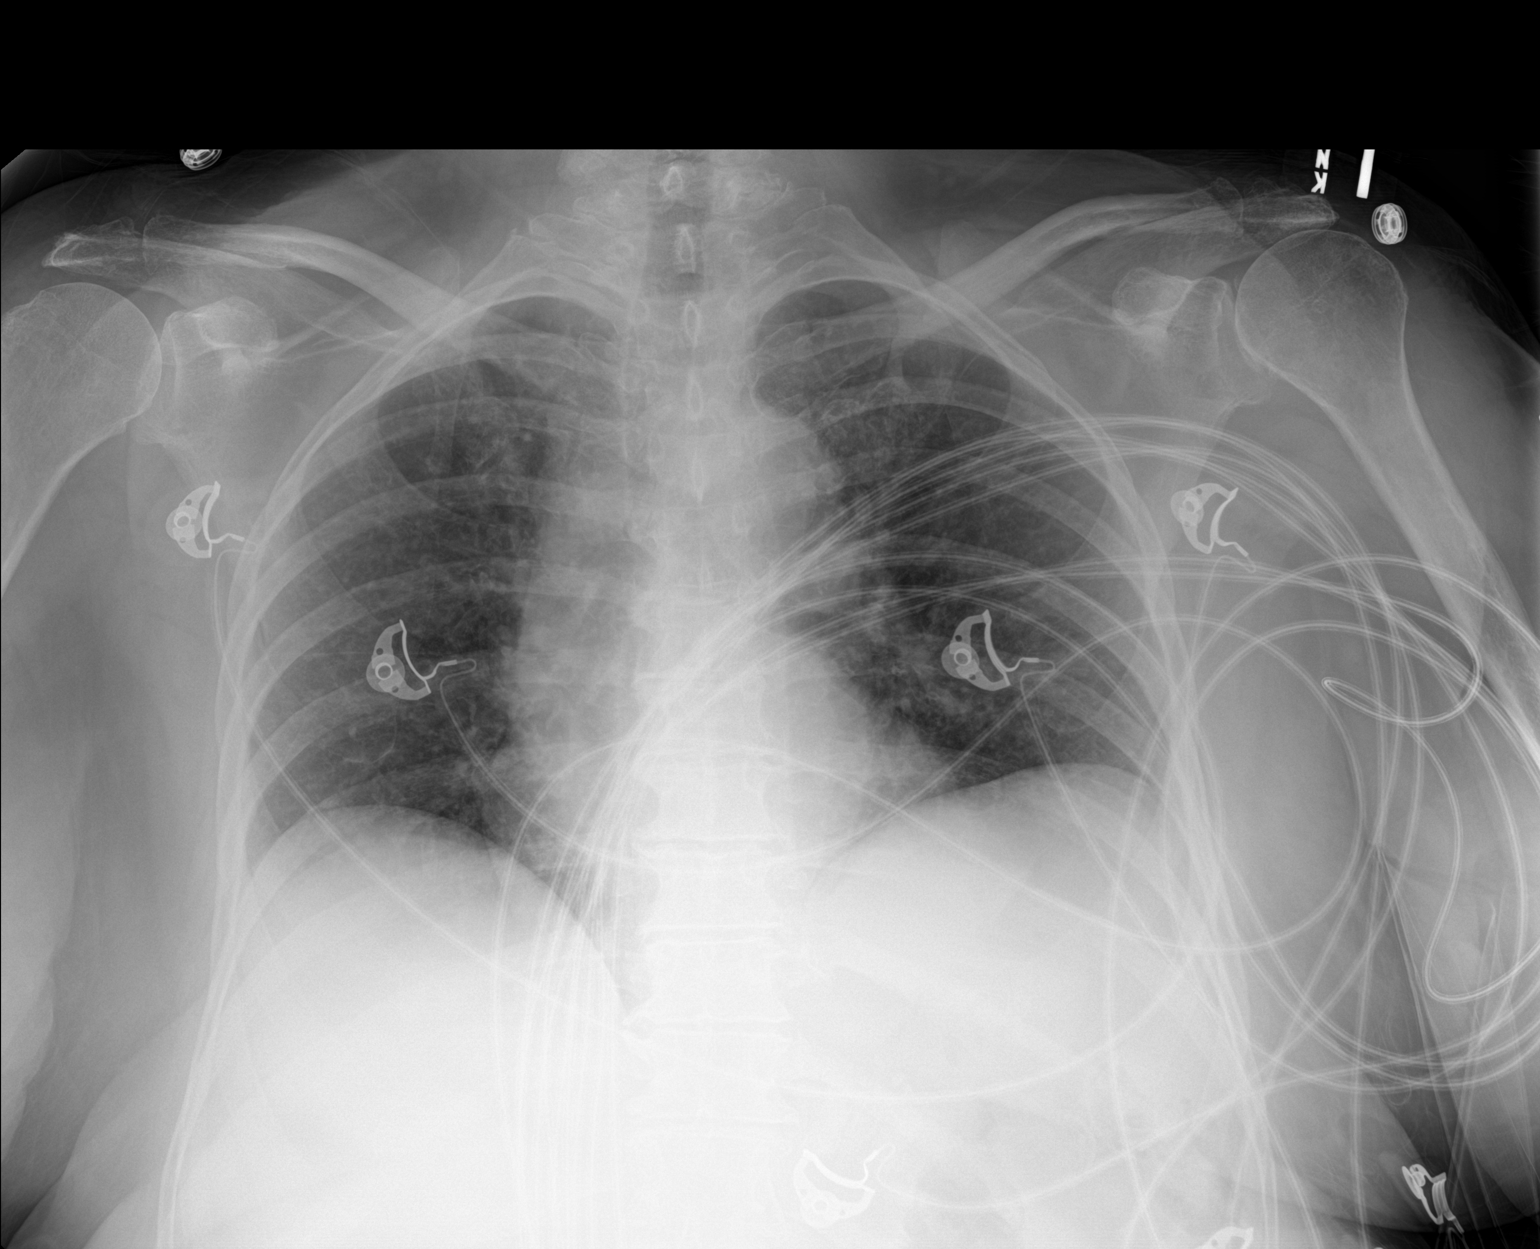

[1 of 1 positions shown; findings below may reference images not displayed]

FINDINGS: Cardiac and mediastinal silhouettes are stable in size and contour,
and remain within normal limits.

Lungs are hypoinflated. Secondary diffuse bronchovascular crowding.
No focal infiltrates. No edema or effusion. No pneumothorax.

No acute osseous abnormality.
IMPRESSION: 1. Shallow lung inflation with secondary diffuse bronchovascular
crowding.
2. No other active cardiopulmonary disease.

## 2019-05-20 IMAGING — CR DG HIP (WITH OR WITHOUT PELVIS) 2-3V*L*
3 series · 3 of 3 positions shown · non-contrast
Comparison: None.

CLINICAL DATA: Left hip pain after fall.

EXAM:
DG HIP (WITH OR WITHOUT PELVIS) 2-3V LEFT

[x hip ap left (1 of 2)]
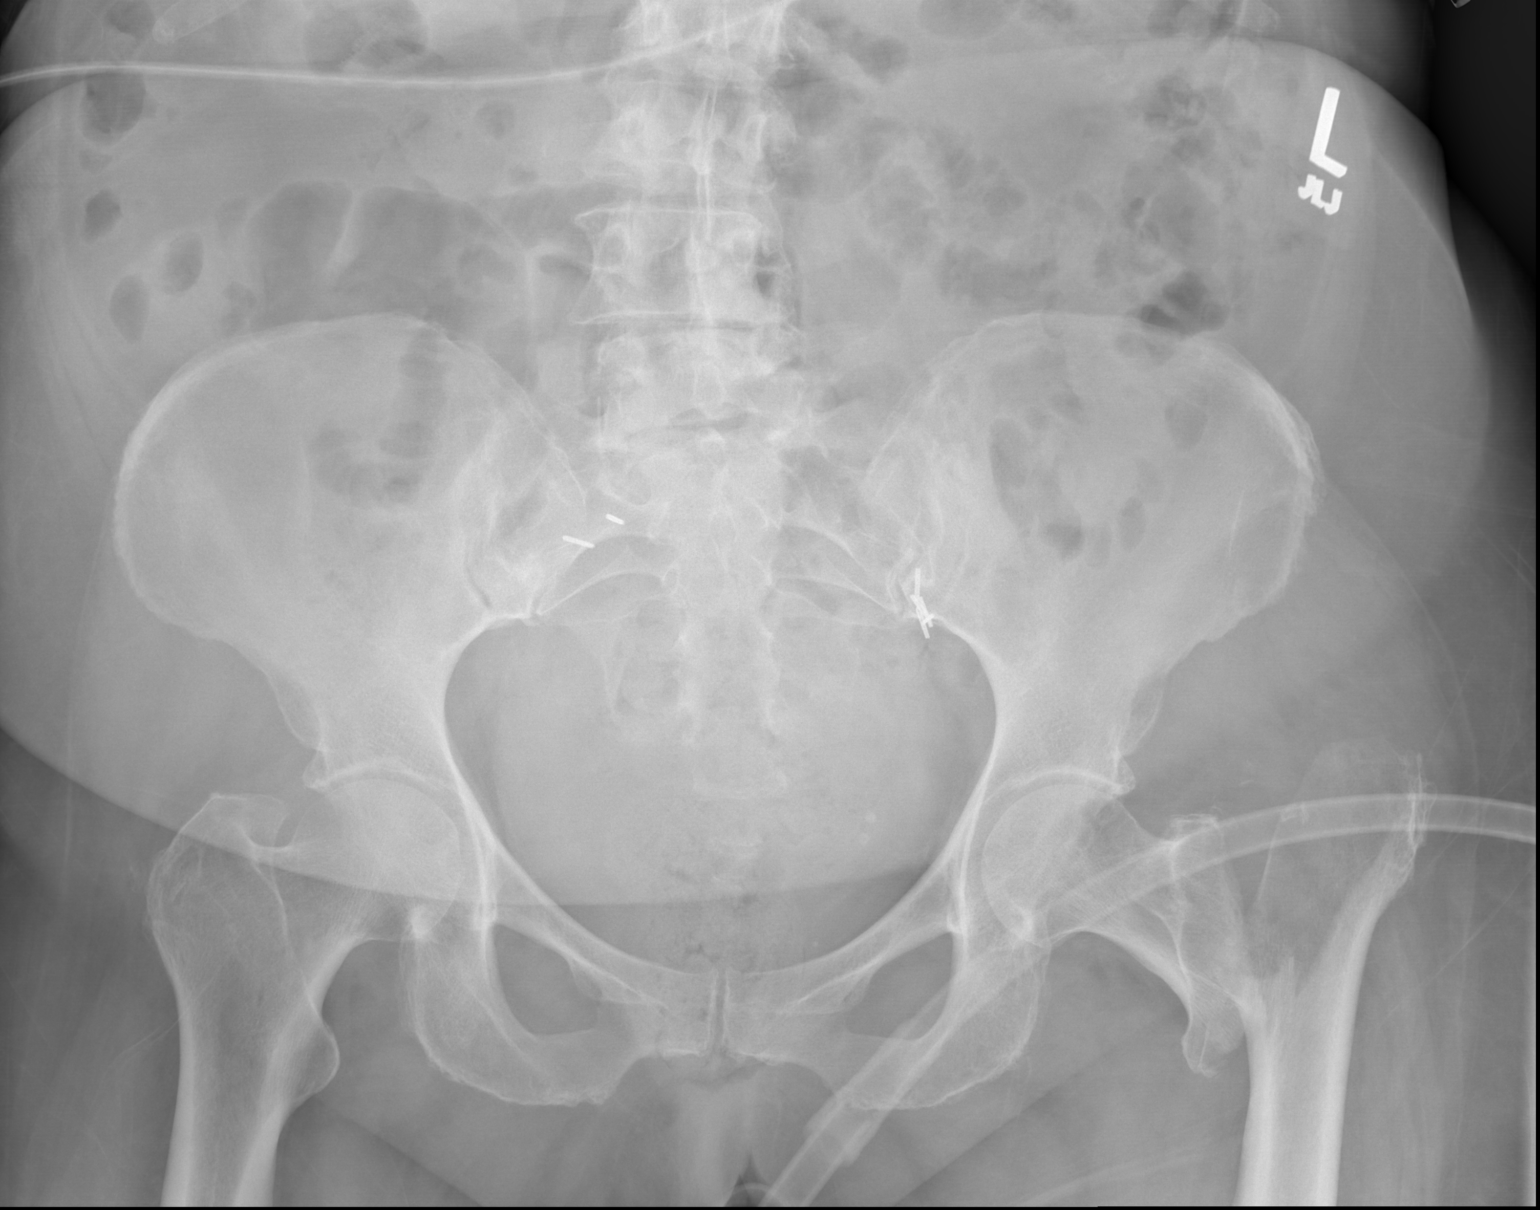

[x hip ap left (2 of 2)]
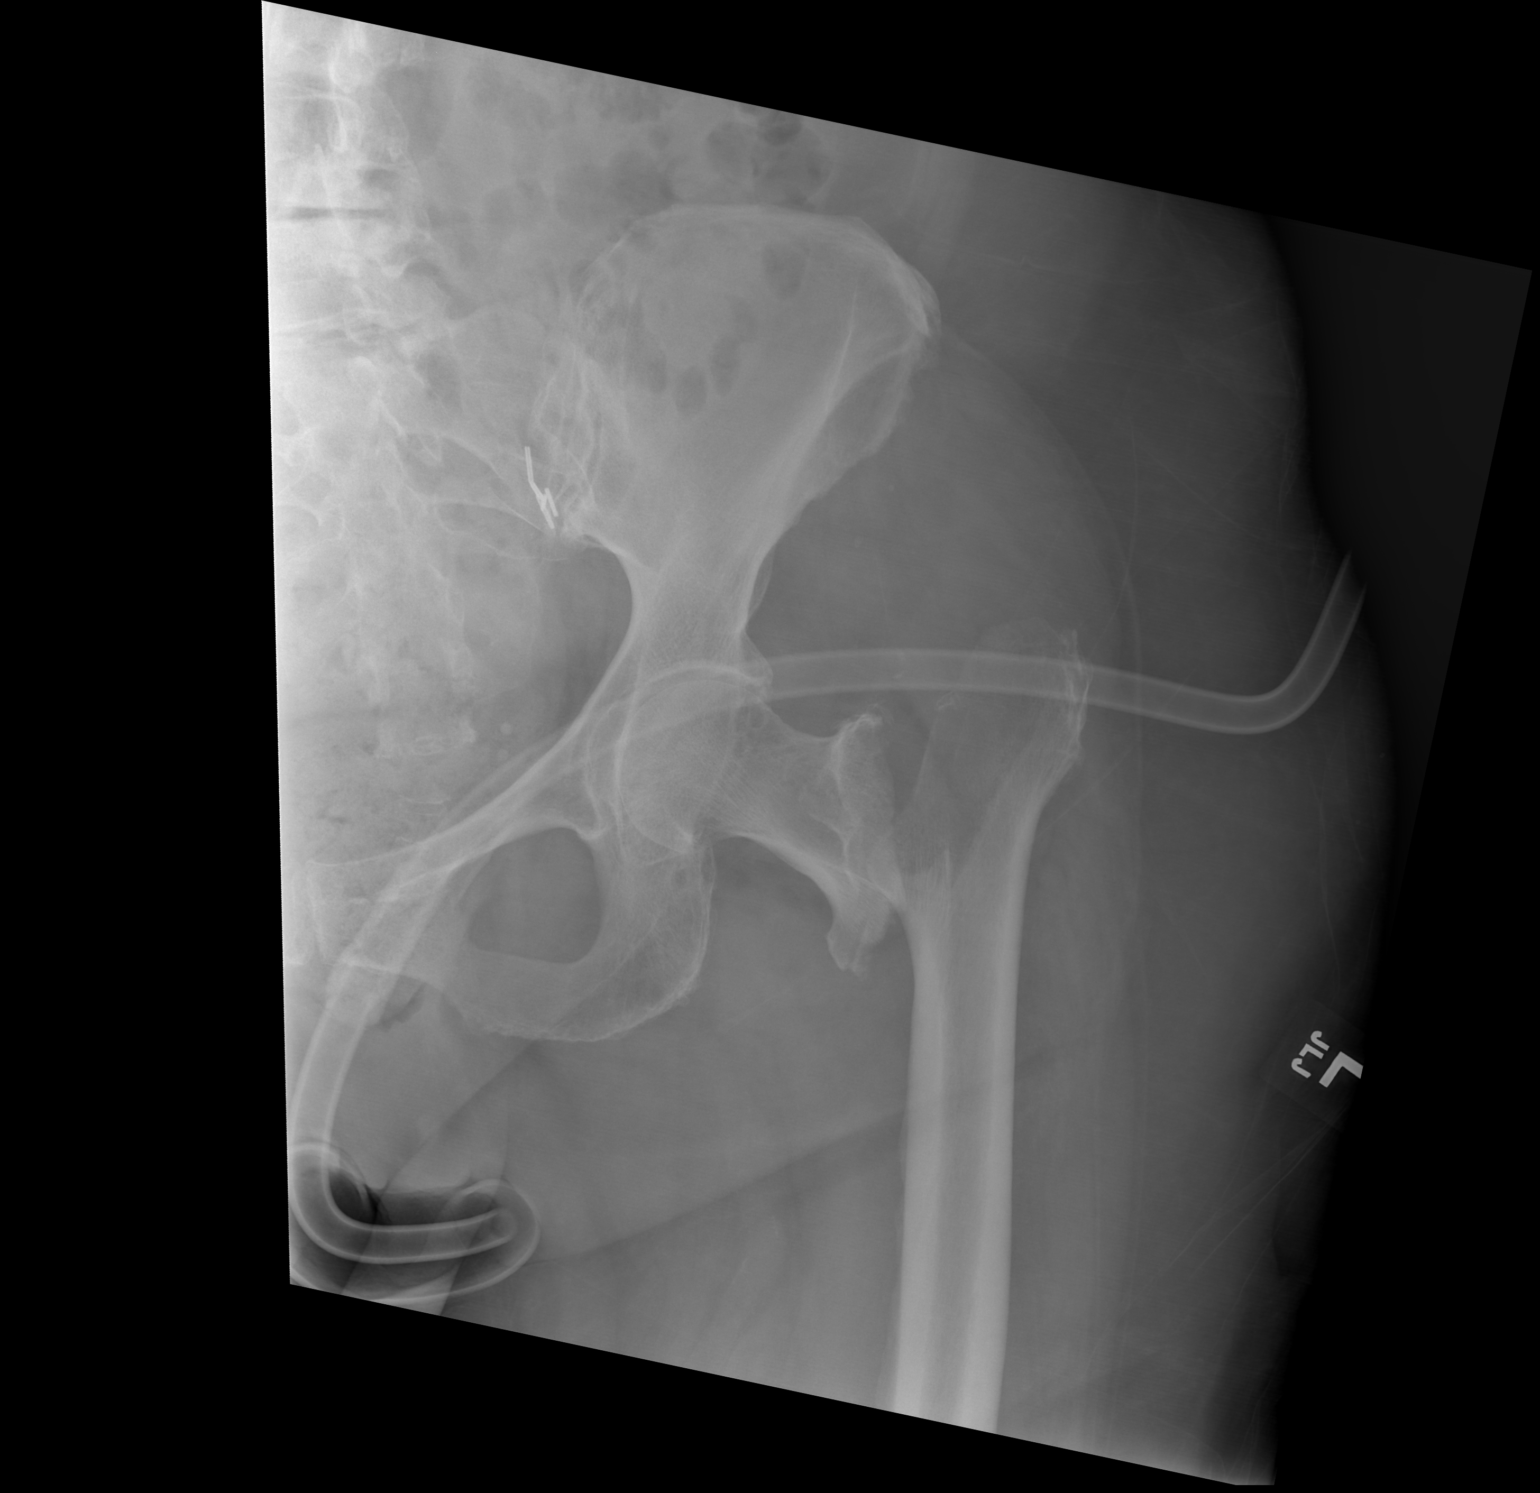

[w hip lat left]
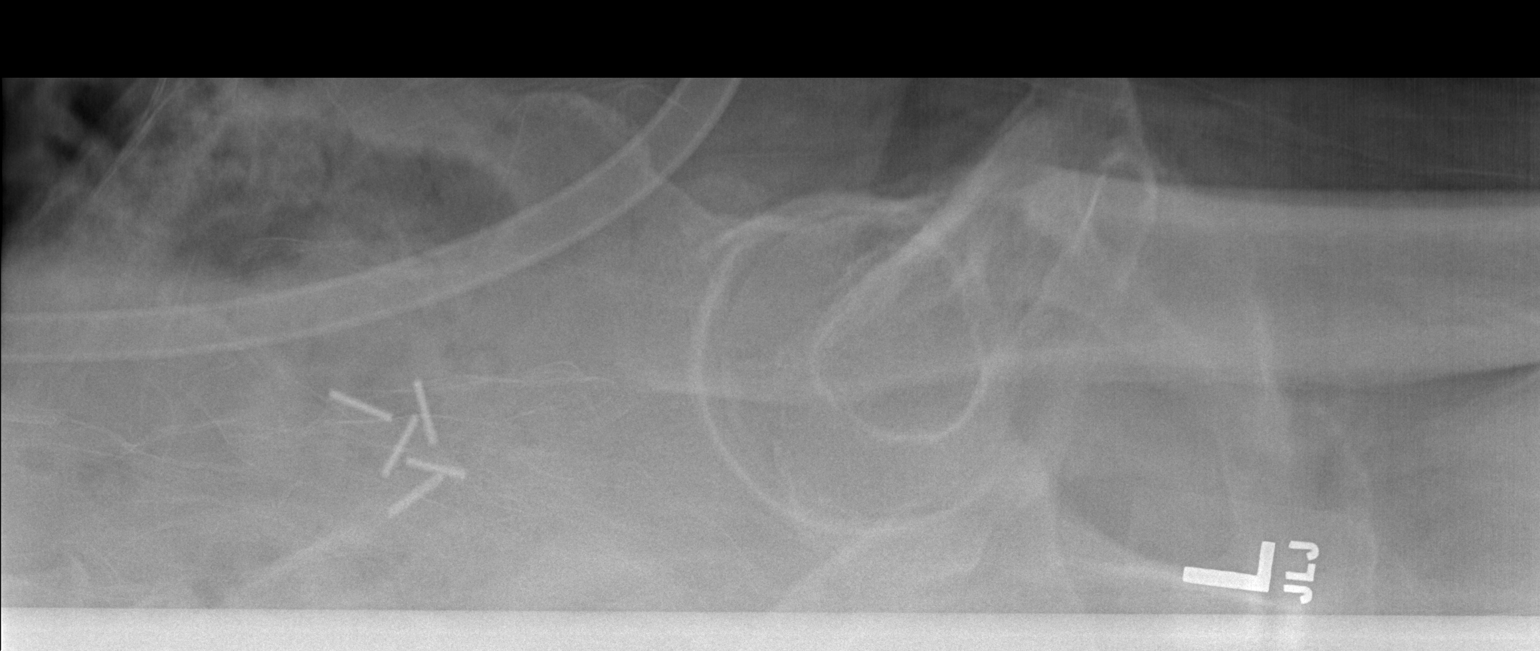

[3 of 3 positions shown; findings below may reference images not displayed]

FINDINGS: Acute left intertrochanteric fracture of the femur is noted with
varus angulation as well as cephalad and lateral displacement of the
femoral shaft relative to femoral head and neck. Intact bony pelvis.
Intact right femur. Mild lumbar spondylosis with multilevel
degenerative disc disease and dextroconvex curvature of the lumbar
spine. Surgical clips project over the upper pelvis bilaterally.
IMPRESSION: Acute displaced and varus angulated intertrochanteric fracture of
the left femur.

Dextroscoliosis with lumbar spondylosis.

## 2019-05-21 IMAGING — DX DG HIP (WITH OR WITHOUT PELVIS) 2-3V*L*
2 series · 3 of 3 positions shown · non-contrast
Comparison: 06/08/2017

CLINICAL DATA: Hip fracture.

EXAM:
DG HIP (WITH OR WITHOUT PELVIS) 2-3V LEFT

[pelvis]
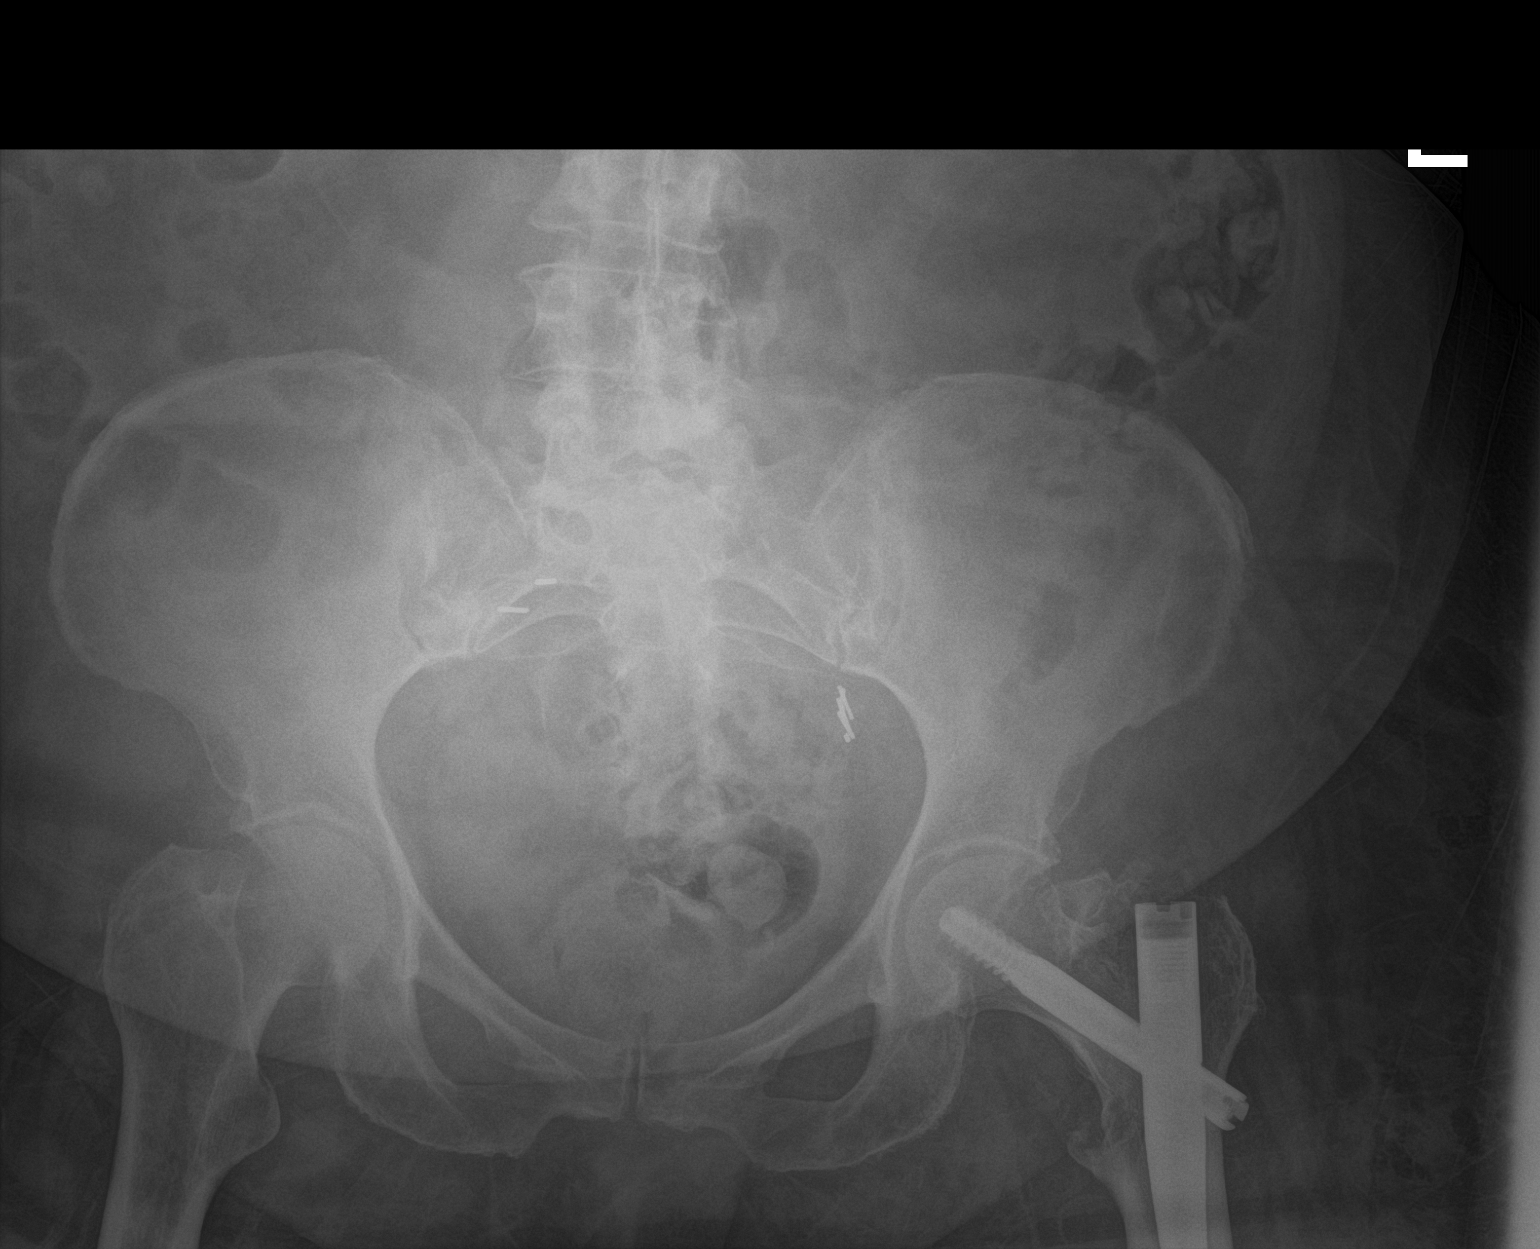

[Series 2: hip · 0.14mm/px · 2 of 2 slices shown]
[im 1/2]
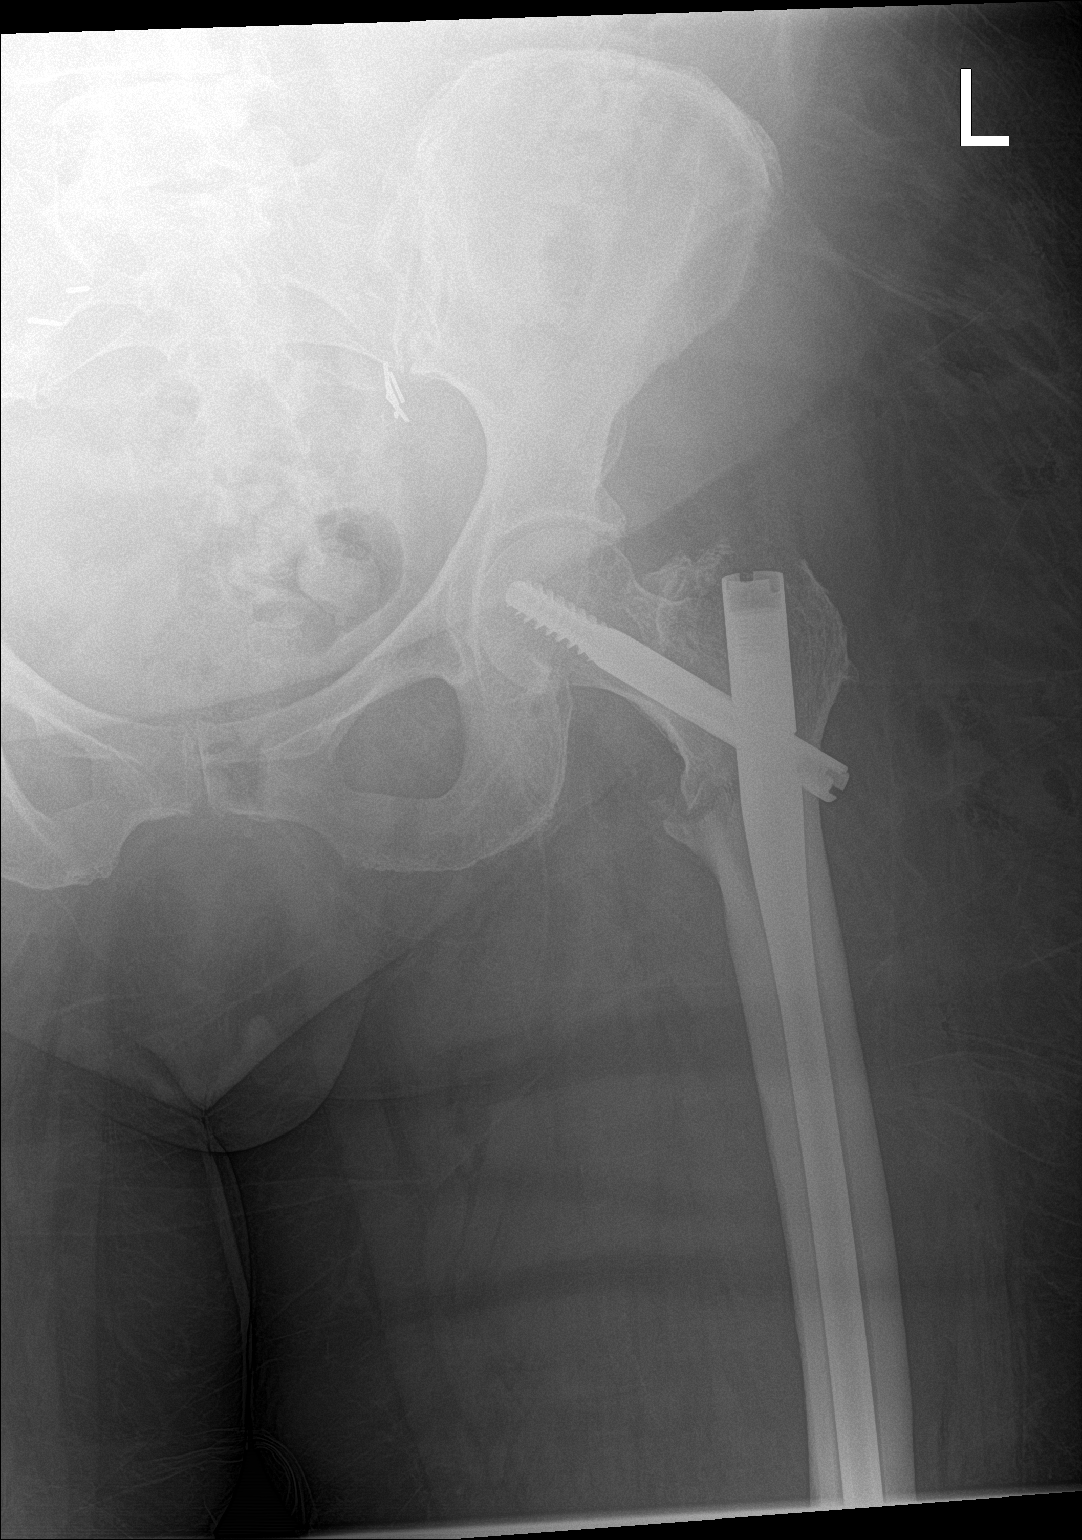
[im 2/2]
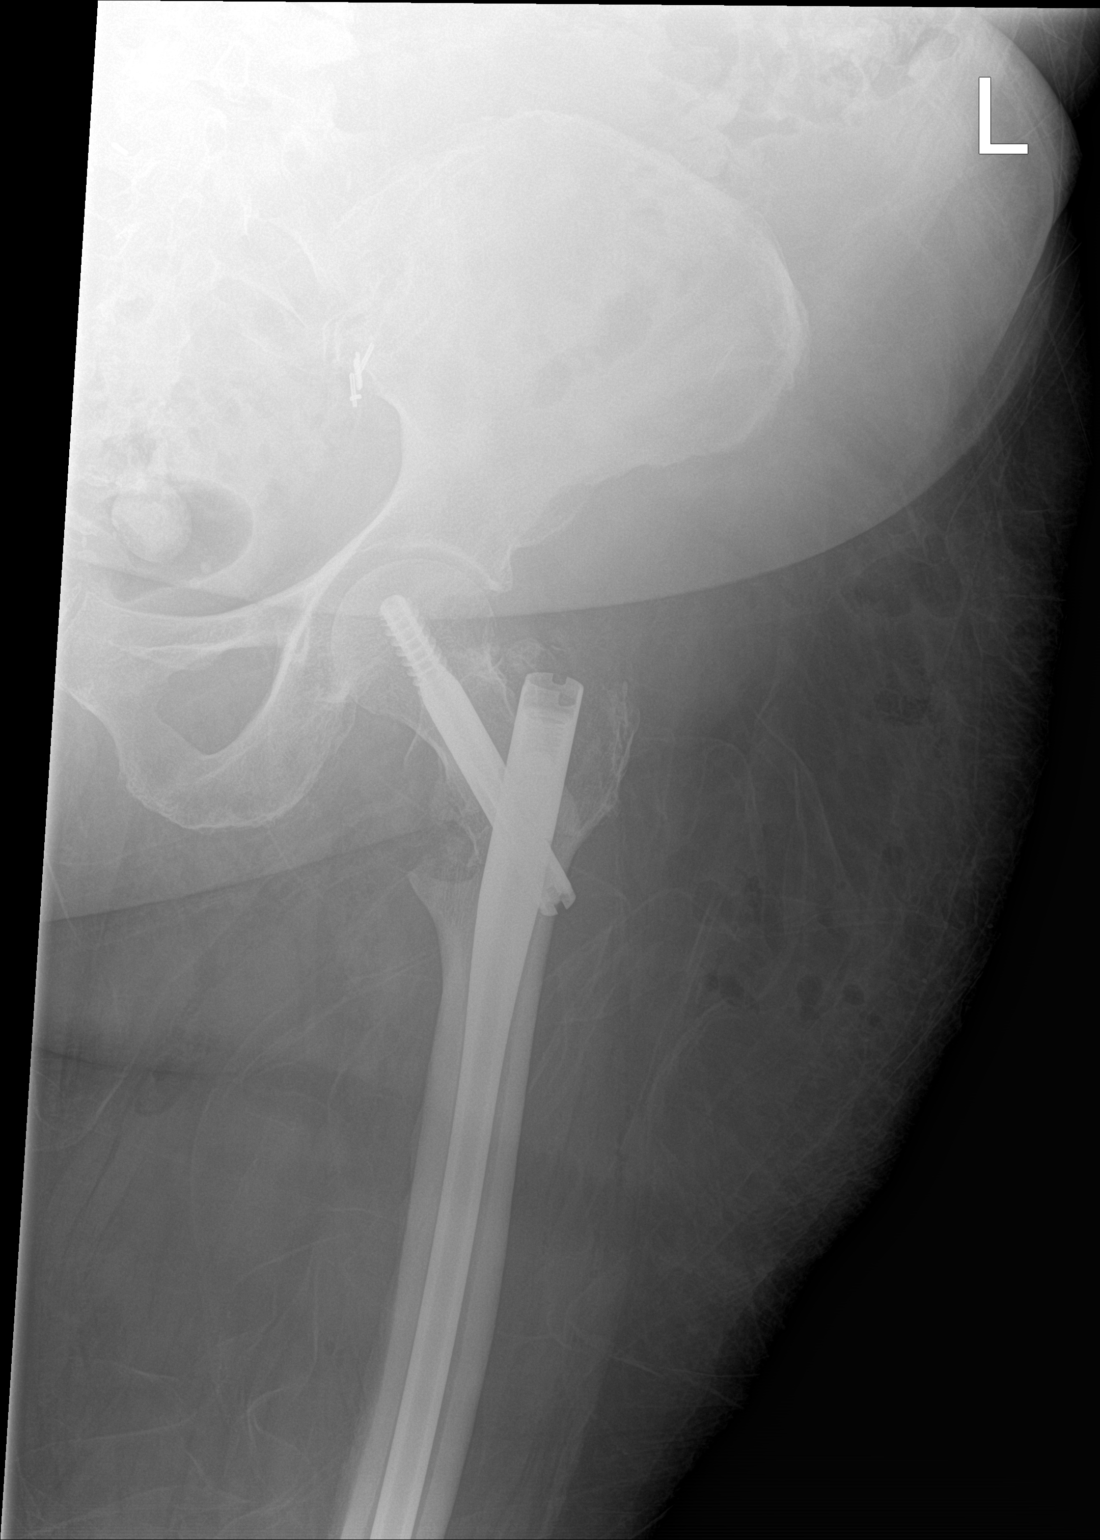

[3 of 3 positions shown; findings below may reference images not displayed]

FINDINGS: Interval fixation of patient's left trochanteric fracture with
intramedullary nail and associated screw bridging the fracture site
into the femoral neck and head. Hardware is intact as there is near
anatomic alignment over the fracture site. Remainder of the exam is
unchanged.
IMPRESSION: Post fixation left intratrochanteric hip fracture with hardware
intact and anatomic alignment about the fracture site.

## 2019-05-27 ENCOUNTER — Telehealth: Payer: Self-pay | Admitting: Family Medicine

## 2019-05-27 DIAGNOSIS — Z1383 Encounter for screening for respiratory disorder NEC: Secondary | ICD-10-CM | POA: Diagnosis not present

## 2019-05-27 DIAGNOSIS — Z20828 Contact with and (suspected) exposure to other viral communicable diseases: Secondary | ICD-10-CM | POA: Diagnosis not present

## 2019-05-27 NOTE — Telephone Encounter (Signed)
[phone] Patients family calling with issues and questions about calcium

## 2019-05-27 NOTE — Telephone Encounter (Signed)
Spoke to dtr. °

## 2019-05-28 ENCOUNTER — Other Ambulatory Visit: Payer: Self-pay

## 2019-05-28 ENCOUNTER — Ambulatory Visit
Admission: RE | Admit: 2019-05-28 | Discharge: 2019-05-28 | Disposition: A | Discharge status: Home / Self Care | Payer: PPO | Source: Ambulatory Visit | Attending: Family Medicine | Admitting: Family Medicine

## 2019-05-28 DIAGNOSIS — Z780 Asymptomatic menopausal state: Secondary | ICD-10-CM | POA: Diagnosis not present

## 2019-05-28 DIAGNOSIS — M8580 Other specified disorders of bone density and structure, unspecified site: Secondary | ICD-10-CM

## 2019-05-28 DIAGNOSIS — Z1231 Encounter for screening mammogram for malignant neoplasm of breast: Secondary | ICD-10-CM

## 2019-05-28 DIAGNOSIS — M85851 Other specified disorders of bone density and structure, right thigh: Secondary | ICD-10-CM | POA: Diagnosis not present

## 2019-05-29 ENCOUNTER — Other Ambulatory Visit: Payer: Self-pay | Admitting: Family Medicine

## 2019-05-29 DIAGNOSIS — N63 Unspecified lump in unspecified breast: Secondary | ICD-10-CM

## 2019-06-01 ENCOUNTER — Telehealth: Payer: Self-pay | Admitting: Family Medicine

## 2019-06-01 NOTE — Chronic Care Management (AMB) (Signed)
  Chronic Care Management   Outreach Note  06/01/2019 Name: [name] MRN: [id number] DOB: Mar 04, 1940  Referred by: Susy Frizzle, MD Reason for referral : Chronic Care Management   An unsuccessful telephone outreach was attempted today. The patient was referred to the pharmacist for assistance with care management and care coordination.   Follow Up Plan:   [place]

## 2019-06-08 DIAGNOSIS — Z1383 Encounter for screening for respiratory disorder NEC: Secondary | ICD-10-CM | POA: Diagnosis not present

## 2019-06-08 DIAGNOSIS — Z20828 Contact with and (suspected) exposure to other viral communicable diseases: Secondary | ICD-10-CM | POA: Diagnosis not present

## 2019-06-12 DIAGNOSIS — M2012 Hallux valgus (acquired), left foot: Secondary | ICD-10-CM | POA: Diagnosis not present

## 2019-06-12 DIAGNOSIS — M79675 Pain in left toe(s): Secondary | ICD-10-CM | POA: Diagnosis not present

## 2019-06-12 DIAGNOSIS — M2041 Other hammer toe(s) (acquired), right foot: Secondary | ICD-10-CM | POA: Diagnosis not present

## 2019-06-12 DIAGNOSIS — E1151 Type 2 diabetes mellitus with diabetic peripheral angiopathy without gangrene: Secondary | ICD-10-CM | POA: Diagnosis not present

## 2019-06-12 DIAGNOSIS — B351 Tinea unguium: Secondary | ICD-10-CM | POA: Diagnosis not present

## 2019-07-01 ENCOUNTER — Telehealth: Payer: Self-pay | Admitting: Family Medicine

## 2019-07-01 NOTE — Progress Notes (Signed)
°  Chronic Care Management   Note  07/01/2019 Name: [name] MRN: [id number] DOB: 1940-12-04  [name] is a 79 y.o. year old female who is a primary care patient of Susy Frizzle, MD. I reached out to [name] by phone today in response to a referral sent by [name]'s PCP, Susy Frizzle, MD.   [name] was given information about Chronic Care Management services today including:  1. CCM service includes personalized support from designated clinical staff supervised by her physician, including individualized plan of care and coordination with other care providers 2. 24/7 contact phone numbers for assistance for urgent and routine care needs. 3. Service will only be billed when office clinical staff spend 20 minutes or more in a month to coordinate care. 4. Only one practitioner may furnish and bill the service in a calendar month. 5. The patient may stop CCM services at any time (effective at the end of the month) by phone call to the office staff.   Patient did not agree to enrollment in care management services and does not wish to consider at this time.  Follow up plan:  Irena Reichmann

## 2019-07-01 NOTE — Progress Notes (Signed)
  Chronic Care Management   Outreach Note  07/01/2019 Name: [name] MRN: [id number] DOB: 1940/03/19  Referred by: Susy Frizzle, MD Reason for referral : No chief complaint on file.   A second unsuccessful telephone outreach was attempted today. The patient was referred to pharmacist for assistance with care management and care coordination.  Follow Up Plan:   [place]

## 2019-07-03 ENCOUNTER — Encounter: Payer: Self-pay | Admitting: Neurology

## 2019-07-03 ENCOUNTER — Other Ambulatory Visit: Payer: Self-pay

## 2019-07-03 ENCOUNTER — Ambulatory Visit: Payer: PPO | Admitting: Neurology

## 2019-07-03 VITALS — BP 112/74 | HR 79 | Ht 61.5 in | Wt 181.8 lb

## 2019-07-03 DIAGNOSIS — F0391 Unspecified dementia with behavioral disturbance: Secondary | ICD-10-CM

## 2019-07-03 DIAGNOSIS — F03B18 Unspecified dementia, moderate, with other behavioral disturbance: Secondary | ICD-10-CM

## 2019-07-03 NOTE — Patient Instructions (Signed)
Continue all your medications. Confirm if taking Seroquel. Continue 24/7 care. Follow-up in 6 months, call for any changes.

## 2019-07-03 NOTE — Progress Notes (Signed)
NEUROLOGY FOLLOW UP OFFICE NOTE  Olivia HUYSER [id number] 1940/09/25  HISTORY OF PRESENT ILLNESS: I had the pleasure of seeing [name] in follow-up in the neurology clinic on 07/03/2019.  The patient was last seen 8 months ago for moderate dementia with behavioral disturbance. She is again accompanied by her daughter [name] who helps supplement the history today.  Records and images were personally reviewed where available.  On her last visit, SLUMS score was 7/30. Sertraline dose was increased to 100mg  daily due to increased paranoia/mood changes since moving to assisted living. It was previously reported that Seroquel was discontinued, however it appears back on her med lis, her daughter is unsure if she is getting it. Staff at [facility] administer medications.She feels her memory is "somewhat." Her daughter states memory is a little bit more of a struggle. She is overall independent with dressing and bathing but is supervised. Appetite is good. No sleep issues reported. [name] thinks the increase in Sertraline has helped, she still has moments of sadness. There was a couple of occasional of hallucinations, more at night, one time she thought her other daughter came to see her when she did not. She reports mood is good. She denies any headaches, dizziness, focal numbness/tingling/weakness, no falls.   History on Initial Assessment 10/09/2016: This is a 79 yo RH woman with a history of hypertension, hyperlipidemia, diabetes, with mild dementia. She feels her memory is pretty good. She denies getting lost driving. She denies misplacing things, which her husband disagrees with and reminds her about losing her glasses frequently. She denies missing medications. Her husband checks after her and agrees. Her husband is in charge of finances. He started noticing memory changes around 1.5-2 years ago, initially forgetting names, but more recently short-term memory has worsened where she cannot recall what she  had for lunch. Long-term memory is good. He reports that when she is not feeling stressed or has to give the answer right away, she does pretty good. Otherwise she would lock up. She takes care of herself well, no difficulties with dressing and bathing. No personality changes or hallucinations. Her husband reports that their daughter has been in and out of the hospital for the past 1.5-2 years, causing a lot of stress on her. She feels her mood is "okay, I guess." She was initially started on Aricept which caused a rash. She is now on Namenda 10mg  BID without side effects, her husband reports they have not noticed any difference on the medication. Her mother had memory problems. She denies any head injuries, she drinks alcohol on occasion.    Diagnostic Data: MRI brain without contrast done 11/2016 did not show any acute changes, there was moderate diffuse atrophy and mild chronic microvascular disease.   PAST MEDICAL HISTORY: Past Medical History:  Diagnosis Date  . Abscessed tooth   . Acquired vaginal enterocele 2018   s/p sacrospinal ligament fixation and posterior colporrhaphy Dr. Zigmund Daniel at [facility]  . CKD (chronic kidney disease), stage III 06/09/2017  . Dementia ([place])    mild mmse 78/2018 (could not remember 3 objects, unable to perform serial 7's)  . Diabetes mellitus without complication ([place])   . Diverticulosis   . Hyperlipidemia   . Hypertension     MEDICATIONS: Current Outpatient Medications on File Prior to Visit  Medication Sig Dispense Refill  . aspirin 81 MG chewable tablet Chew 81 mg by mouth.    . Calcium Carb-Cholecalciferol (CALCIUM 1000 + D PO) Take by mouth daily.    Marland Kitchen  Cholecalciferol (VITAMIN D) 2000 UNITS CAPS Take 2,000 Units by mouth daily.    Marland Kitchen JANUVIA 100 MG tablet TAKE 1 TABLET BY MOUTH DAILY FOR DIABETES. 30 tablet 5  . losartan (COZAAR) 100 MG tablet TAKE 1 TABLET BY MOUTH DAILY FOR HYPERTENSION. 30 tablet 5  . memantine (NAMENDA) 10 MG tablet TAKE 1 TABLET BY  MOUTH TWO TIMES A DAY FOR DEMENTIA. 60 tablet 0  . Multiple Vitamin (MULTIVITAMIN) tablet Take 1 tablet by mouth daily.    Marland Kitchen omeprazole (PRILOSEC) 40 MG capsule TAKE 1 CAPSULE BY MOUTH DAILY. DX: GERD 30 capsule 5  . pravastatin (PRAVACHOL) 40 MG tablet TAKE 1 TABLET BY MOUTH DAILY FOR HYPERLIPIDEMIA. 30 tablet 5  . QUEtiapine (SEROQUEL) 25 MG tablet     . sertraline (ZOLOFT) 100 MG tablet Take 1 tablet (100 mg total) by mouth daily. 30 tablet 11   No current facility-administered medications on file prior to visit.    ALLERGIES: Allergies  Allergen Reactions  . Aricept [Donepezil Hcl]   . Bactrim [Sulfamethoxazole-Trimethoprim]   . Flagyl [Metronidazole] Hives  . Lisinopril     hyperkalemia    FAMILY HISTORY: Family History  Problem Relation Age of Onset  . CAD Mother   . Heart attack Father     SOCIAL HISTORY: Social History   Socioeconomic History  . Marital status: Widowed    Spouse name: Not on file  . Number of children: Not on file  . Years of education: Not on file  . Highest education level: Not on file  Occupational History  . Not on file  Tobacco Use  . Smoking status: Never Smoker  . Smokeless tobacco: Never Used  Vaping Use  . Vaping Use: Never used  Substance and Sexual Activity  . Alcohol use: Yes    Alcohol/week: 2.0 standard drinks    Types: 2 Shots of liquor per week    Comment: 2 drinks of gin a day  . Drug use: No  . Sexual activity: Yes  Other Topics Concern  . Not on file  Social History Narrative   Lives in [place]      Right handed      Highest level of edu- some college   Social Determinants of Health   Financial Resource Strain:   . Difficulty of Paying Living Expenses:   Food Insecurity:   . Worried About Charity fundraiser in the Last Year:   . Arboriculturist in the Last Year:   Transportation Needs:   . Film/video editor (Medical):   Marland Kitchen Lack of Transportation (Non-Medical):   Physical  Activity:   . Days of Exercise per Week:   . Minutes of Exercise per Session:   Stress:   . Feeling of Stress :   Social Connections:   . Frequency of Communication with Friends and Family:   . Frequency of Social Gatherings with Friends and Family:   . Attends Religious Services:   . Active Member of Clubs or Organizations:   . Attends Archivist Meetings:   Marland Kitchen Marital Status:   Intimate Partner Violence:   . Fear of Current or Ex-Partner:   . Emotionally Abused:   Marland Kitchen Physically Abused:   . Sexually Abused:    PHYSICAL EXAM: Vitals:   07/03/19 1000  BP: 112/74  Pulse: 79  SpO2: 96%   General: No acute distress Head:  Normocephalic/atraumatic Skin/Extremities: No rash, no edema Neurological Exam: alert and oriented to person,  city.  No aphasia or dysarthria. Fund of knowledge is reduced.  Recent and remote memory are impaired.  Attention and concentration are reduced.   Able to name objects and repeat phrases. MMSE 11/30. MMSE - Mini Mental State Exam 07/03/2019 03/05/2018 07/17/2017  Orientation to time 0 0 0  Orientation to Place 2 3 4   Registration 3 3 3   Attention/ Calculation 0 5 5  Recall 0 0 0  Language- name 2 objects 2 2 2   Language- repeat 1 1 1   Language- follow 3 step command 1 3 2   Language- read & follow direction 1 1 1   Write a sentence 0 1 1  Copy design 1 1 1   Total score 11 20 20     Cranial nerves: Pupils equal, round, reactive to light. Extraocular movements intact with no nystagmus. Visual fields full. No facial asymmetry.  Motor: Bulk and tone normal, muscle strength 5/5 throughout with no pronator drift.  Finger to nose testing intact.  Gait slow and cautious, no ataxia  IMPRESSION: This is a 79 yo RH woman with vascular risk factors including hypertension, hyperlipidemia, diabetes, and moderate dementia with behavioral disturbance, likely due to Alzheimer's disease. MMSE today 11/30. She had side effect on Donepezil, continue Memantine 10mg   BID. Mood overall stable on Sertraline 100mg  daily. Her daughter will confirm if she is taking Seroquel, no significant hallucinations. Continue 24/7 supervision. She does not drive. Follow-up in 6 months, they know to call for any changes.    Thank you for allowing me to participate in her care.  Please do not hesitate to call for any questions or concerns.   Ellouise Newer, M.D.   CC: Dr. Dennard Schaumann

## 2019-07-14 DIAGNOSIS — M25761 Osteophyte, right knee: Secondary | ICD-10-CM | POA: Diagnosis not present

## 2019-07-14 DIAGNOSIS — M25561 Pain in right knee: Secondary | ICD-10-CM | POA: Diagnosis not present

## 2019-07-14 DIAGNOSIS — M1711 Unilateral primary osteoarthritis, right knee: Secondary | ICD-10-CM | POA: Diagnosis not present

## 2019-07-23 DIAGNOSIS — M1712 Unilateral primary osteoarthritis, left knee: Secondary | ICD-10-CM | POA: Diagnosis not present

## 2019-07-24 DIAGNOSIS — F0390 Unspecified dementia without behavioral disturbance: Secondary | ICD-10-CM | POA: Diagnosis not present

## 2019-07-24 DIAGNOSIS — F329 Major depressive disorder, single episode, unspecified: Secondary | ICD-10-CM | POA: Diagnosis not present

## 2019-07-30 DIAGNOSIS — M1712 Unilateral primary osteoarthritis, left knee: Secondary | ICD-10-CM | POA: Diagnosis not present

## 2019-09-04 DIAGNOSIS — E119 Type 2 diabetes mellitus without complications: Secondary | ICD-10-CM | POA: Diagnosis not present

## 2019-09-04 DIAGNOSIS — Z7982 Long term (current) use of aspirin: Secondary | ICD-10-CM | POA: Diagnosis not present

## 2019-09-04 DIAGNOSIS — M1712 Unilateral primary osteoarthritis, left knee: Secondary | ICD-10-CM | POA: Diagnosis not present

## 2019-09-04 DIAGNOSIS — F329 Major depressive disorder, single episode, unspecified: Secondary | ICD-10-CM | POA: Diagnosis not present

## 2019-09-04 DIAGNOSIS — E785 Hyperlipidemia, unspecified: Secondary | ICD-10-CM | POA: Diagnosis not present

## 2019-09-04 DIAGNOSIS — M8580 Other specified disorders of bone density and structure, unspecified site: Secondary | ICD-10-CM | POA: Diagnosis not present

## 2019-09-04 DIAGNOSIS — F0390 Unspecified dementia without behavioral disturbance: Secondary | ICD-10-CM | POA: Diagnosis not present

## 2019-09-04 DIAGNOSIS — Z7984 Long term (current) use of oral hypoglycemic drugs: Secondary | ICD-10-CM | POA: Diagnosis not present

## 2019-09-04 DIAGNOSIS — I10 Essential (primary) hypertension: Secondary | ICD-10-CM | POA: Diagnosis not present

## 2019-09-04 DIAGNOSIS — Z9181 History of falling: Secondary | ICD-10-CM | POA: Diagnosis not present

## 2019-09-25 DIAGNOSIS — E785 Hyperlipidemia, unspecified: Secondary | ICD-10-CM | POA: Diagnosis not present

## 2019-09-25 DIAGNOSIS — M8580 Other specified disorders of bone density and structure, unspecified site: Secondary | ICD-10-CM | POA: Diagnosis not present

## 2019-09-25 DIAGNOSIS — F0390 Unspecified dementia without behavioral disturbance: Secondary | ICD-10-CM | POA: Diagnosis not present

## 2019-09-25 DIAGNOSIS — Z7984 Long term (current) use of oral hypoglycemic drugs: Secondary | ICD-10-CM | POA: Diagnosis not present

## 2019-09-25 DIAGNOSIS — F329 Major depressive disorder, single episode, unspecified: Secondary | ICD-10-CM | POA: Diagnosis not present

## 2019-09-25 DIAGNOSIS — I10 Essential (primary) hypertension: Secondary | ICD-10-CM | POA: Diagnosis not present

## 2019-09-25 DIAGNOSIS — M1712 Unilateral primary osteoarthritis, left knee: Secondary | ICD-10-CM | POA: Diagnosis not present

## 2019-09-25 DIAGNOSIS — Z7982 Long term (current) use of aspirin: Secondary | ICD-10-CM | POA: Diagnosis not present

## 2019-09-25 DIAGNOSIS — E119 Type 2 diabetes mellitus without complications: Secondary | ICD-10-CM | POA: Diagnosis not present

## 2019-09-25 DIAGNOSIS — Z9181 History of falling: Secondary | ICD-10-CM | POA: Diagnosis not present

## 2019-10-14 DIAGNOSIS — F338 Other recurrent depressive disorders: Secondary | ICD-10-CM | POA: Diagnosis not present

## 2019-10-14 DIAGNOSIS — F0390 Unspecified dementia without behavioral disturbance: Secondary | ICD-10-CM | POA: Diagnosis not present

## 2019-10-21 DIAGNOSIS — E119 Type 2 diabetes mellitus without complications: Secondary | ICD-10-CM | POA: Diagnosis not present

## 2019-10-21 DIAGNOSIS — I10 Essential (primary) hypertension: Secondary | ICD-10-CM | POA: Diagnosis not present

## 2019-10-21 DIAGNOSIS — K219 Gastro-esophageal reflux disease without esophagitis: Secondary | ICD-10-CM | POA: Diagnosis not present

## 2019-10-21 DIAGNOSIS — E782 Mixed hyperlipidemia: Secondary | ICD-10-CM | POA: Diagnosis not present

## 2019-10-27 DIAGNOSIS — E0821 Diabetes mellitus due to underlying condition with diabetic nephropathy: Secondary | ICD-10-CM | POA: Diagnosis not present

## 2019-11-11 DIAGNOSIS — F919 Conduct disorder, unspecified: Secondary | ICD-10-CM | POA: Diagnosis not present

## 2019-11-11 DIAGNOSIS — F338 Other recurrent depressive disorders: Secondary | ICD-10-CM | POA: Diagnosis not present

## 2019-11-11 DIAGNOSIS — F0390 Unspecified dementia without behavioral disturbance: Secondary | ICD-10-CM | POA: Diagnosis not present

## 2019-12-23 DIAGNOSIS — F919 Conduct disorder, unspecified: Secondary | ICD-10-CM | POA: Diagnosis not present

## 2019-12-23 DIAGNOSIS — F0390 Unspecified dementia without behavioral disturbance: Secondary | ICD-10-CM | POA: Diagnosis not present

## 2019-12-23 DIAGNOSIS — F338 Other recurrent depressive disorders: Secondary | ICD-10-CM | POA: Diagnosis not present

## 2019-12-30 DIAGNOSIS — F323 Major depressive disorder, single episode, severe with psychotic features: Secondary | ICD-10-CM | POA: Diagnosis not present

## 2019-12-30 DIAGNOSIS — I10 Essential (primary) hypertension: Secondary | ICD-10-CM | POA: Diagnosis not present

## 2019-12-30 DIAGNOSIS — E119 Type 2 diabetes mellitus without complications: Secondary | ICD-10-CM | POA: Diagnosis not present

## 2019-12-30 DIAGNOSIS — K219 Gastro-esophageal reflux disease without esophagitis: Secondary | ICD-10-CM | POA: Diagnosis not present

## 2020-02-02 ENCOUNTER — Ambulatory Visit: Payer: PPO | Admitting: Neurology

## 2020-02-05 DIAGNOSIS — I10 Essential (primary) hypertension: Secondary | ICD-10-CM | POA: Diagnosis not present

## 2020-02-05 DIAGNOSIS — U071 COVID-19: Secondary | ICD-10-CM | POA: Diagnosis not present

## 2020-02-11 DIAGNOSIS — F919 Conduct disorder, unspecified: Secondary | ICD-10-CM | POA: Diagnosis not present

## 2020-02-11 DIAGNOSIS — F338 Other recurrent depressive disorders: Secondary | ICD-10-CM | POA: Diagnosis not present

## 2020-02-11 DIAGNOSIS — F0390 Unspecified dementia without behavioral disturbance: Secondary | ICD-10-CM | POA: Diagnosis not present

## 2020-03-03 ENCOUNTER — Ambulatory Visit: Payer: PPO | Admitting: Family Medicine

## 2020-03-24 DIAGNOSIS — F0390 Unspecified dementia without behavioral disturbance: Secondary | ICD-10-CM | POA: Diagnosis not present

## 2020-03-24 DIAGNOSIS — F338 Other recurrent depressive disorders: Secondary | ICD-10-CM | POA: Diagnosis not present

## 2020-03-24 DIAGNOSIS — F919 Conduct disorder, unspecified: Secondary | ICD-10-CM | POA: Diagnosis not present

## 2020-04-14 DIAGNOSIS — I2510 Atherosclerotic heart disease of native coronary artery without angina pectoris: Secondary | ICD-10-CM | POA: Diagnosis not present

## 2020-04-14 DIAGNOSIS — I10 Essential (primary) hypertension: Secondary | ICD-10-CM | POA: Diagnosis not present

## 2020-04-14 DIAGNOSIS — E782 Mixed hyperlipidemia: Secondary | ICD-10-CM | POA: Diagnosis not present

## 2020-04-14 DIAGNOSIS — F0151 Vascular dementia with behavioral disturbance: Secondary | ICD-10-CM | POA: Diagnosis not present

## 2020-04-19 DIAGNOSIS — E559 Vitamin D deficiency, unspecified: Secondary | ICD-10-CM | POA: Diagnosis not present

## 2020-04-19 DIAGNOSIS — E039 Hypothyroidism, unspecified: Secondary | ICD-10-CM | POA: Diagnosis not present

## 2020-04-19 DIAGNOSIS — D519 Vitamin B12 deficiency anemia, unspecified: Secondary | ICD-10-CM | POA: Diagnosis not present

## 2020-04-19 DIAGNOSIS — E0821 Diabetes mellitus due to underlying condition with diabetic nephropathy: Secondary | ICD-10-CM | POA: Diagnosis not present

## 2020-04-19 DIAGNOSIS — F0151 Vascular dementia with behavioral disturbance: Secondary | ICD-10-CM | POA: Diagnosis not present

## 2020-04-19 DIAGNOSIS — R4182 Altered mental status, unspecified: Secondary | ICD-10-CM | POA: Diagnosis not present

## 2020-04-21 DIAGNOSIS — F0390 Unspecified dementia without behavioral disturbance: Secondary | ICD-10-CM | POA: Diagnosis not present

## 2020-04-21 DIAGNOSIS — F919 Conduct disorder, unspecified: Secondary | ICD-10-CM | POA: Diagnosis not present

## 2020-04-21 DIAGNOSIS — F338 Other recurrent depressive disorders: Secondary | ICD-10-CM | POA: Diagnosis not present

## 2020-05-05 ENCOUNTER — Ambulatory Visit: Payer: PPO | Admitting: Physician Assistant

## 2020-05-09 NOTE — Patient Instructions (Addendum)
.It was a pleasure to see you today at our office.   Recommendations:   Continue memantine 10 mg twice daily . A refill was sent to your pharmacy   Continue sertraline 100 mg daily for mood control. Refill is available   Continue Seroquel 25 mg tablet twice daily. Refill sent  Follow-up in 6 months  RECOMMENDATIONS FOR ALL PATIENTS WITH MEMORY PROBLEMS: 1. Continue to exercise (Recommend 30 minutes of walking everyday, or 3 hours every week) 2. Increase social interactions - continue going to [place] and enjoy social gatherings with friends and family 3. Eat healthy, avoid fried foods and eat more fruits and vegetables 4. Maintain adequate blood pressure, blood sugar, and blood cholesterol level. Reducing the risk of stroke and cardiovascular disease also helps promoting better memory. 5. Avoid stressful situations. Live a simple life and avoid aggravations. Organize your time and prepare for the next day in anticipation. 6. Sleep well, avoid any interruptions of sleep and avoid any distractions in the bedroom that may interfere with adequate sleep quality 7. Avoid sugar, avoid sweets as there is a strong link between excessive sugar intake, diabetes, and cognitive impairment We discussed the Mediterranean diet, which has been shown to help patients reduce the risk of progressive memory disorders and reduces cardiovascular risk. This includes eating fish, eat fruits and green leafy vegetables, nuts like almonds and hazelnuts, walnuts, and also use olive oil. Avoid fast foods and fried foods as much as possible. Avoid sweets and sugar as sugar use has been linked to worsening of memory function.  There is always a concern of gradual progression of memory problems. If this is the case, then we may need to adjust level of care according to patient needs. Support, both to the patient and caregiver, should then be put into place.      FALL PRECAUTIONS: Be cautious when walking. Scan the area  for obstacles that may increase the risk of trips and falls. When getting up in the mornings, sit up at the edge of the bed for a few minutes before getting out of bed. Consider elevating the bed at the head end to avoid drop of blood pressure when getting up. Walk always in a well-lit room (use night lights in the walls). Avoid area rugs or power cords from appliances in the middle of the walkways. Use a walker or a cane if necessary and consider physical therapy for balance exercise. Get your eyesight checked regularly.     Mediterranean Diet A Mediterranean diet refers to food and lifestyle choices that are based on the traditions of countries located on the [employer]. This way of eating has been shown to help prevent certain conditions and improve outcomes for people who have chronic diseases, like kidney disease and heart disease. What are tips for following this plan? Lifestyle   Cook and eat meals together with your family, when possible.  Drink enough fluid to keep your urine clear or pale yellow.  Be physically active every day. This includes:  Aerobic exercise like running or swimming.  Leisure activities like gardening, walking, or housework.  Get 7-8 hours of sleep each night.  If recommended by your health care provider, drink red wine in moderation. This means 1 glass a day for nonpregnant women and 2 glasses a day for men. A glass of wine equals 5 oz (150 mL). Reading food labels   Check the serving size of packaged foods. For foods such as rice and pasta, the serving size  refers to the amount of cooked product, not dry.  Check the total fat in packaged foods. Avoid foods that have saturated fat or trans fats.  Check the ingredients list for added sugars, such as corn syrup. Shopping   At the grocery store, buy most of your food from the areas near the walls of the store. This includes:  Fresh fruits and vegetables (produce).  Grains, beans, nuts, and seeds.  Some of these may be available in unpackaged forms or large amounts (in bulk).  Fresh seafood.  Poultry and eggs.  Low-fat dairy products.  Buy whole ingredients instead of prepackaged foods.  Buy fresh fruits and vegetables in-season from local farmers markets.  Buy frozen fruits and vegetables in resealable bags.  If you do not have access to quality fresh seafood, buy precooked frozen shrimp or canned fish, such as tuna, salmon, or sardines.  Buy small amounts of raw or cooked vegetables, salads, or olives from the deli or salad bar at your store.  Stock your pantry so you always have certain foods on hand, such as olive oil, canned tuna, canned tomatoes, rice, pasta, and beans. Cooking   Cook foods with extra-virgin olive oil instead of using butter or other vegetable oils.  Have meat as a side dish, and have vegetables or grains as your main dish. This means having meat in small portions or adding small amounts of meat to foods like pasta or stew.  Use beans or vegetables instead of meat in common dishes like chili or lasagna.  Experiment with different cooking methods. Try roasting or broiling vegetables instead of steaming or sauteing them.  Add frozen vegetables to soups, stews, pasta, or rice.  Add nuts or seeds for added healthy fat at each meal. You can add these to yogurt, salads, or vegetable dishes.  Marinate fish or vegetables using olive oil, lemon juice, garlic, and fresh herbs. Meal planning   Plan to eat 1 vegetarian meal one day each week. Try to work up to 2 vegetarian meals, if possible.  Eat seafood 2 or more times a week.  Have healthy snacks readily available, such as:  Vegetable sticks with hummus.  Greek yogurt.  Fruit and nut trail mix.  Eat balanced meals throughout the week. This includes:  Fruit: 2-3 servings a day  Vegetables: 4-5 servings a day  Low-fat dairy: 2 servings a day  Fish, poultry, or lean meat: 1 serving a  day  Beans and legumes: 2 or more servings a week  Nuts and seeds: 1-2 servings a day  Whole grains: 6-8 servings a day  Extra-virgin olive oil: 3-4 servings a day  Limit red meat and sweets to only a few servings a month What are my food choices?  Mediterranean diet  Recommended  Grains: Whole-grain pasta. Brown rice. Bulgar wheat. Polenta. Couscous. Whole-wheat bread. [place].  Vegetables: Artichokes. Beets. Broccoli. Cabbage. Carrots. Eggplant. Green beans. Chard. Kale. Spinach. Onions. Leeks. Peas. Squash. Tomatoes. Peppers. Radishes.  Fruits: Apples. Apricots. Avocado. Berries. Bananas. Cherries. Dates. Figs. Grapes. Lemons. Melon. Oranges. Peaches. Plums. Pomegranate.  Meats and other protein foods: Beans. Almonds. Sunflower seeds. Pine nuts. Peanuts. [place]. Salmon. Scallops. Shrimp. [place]. Tilapia. Clams. Oysters. Eggs.  Dairy: Low-fat milk. Cheese. Greek yogurt.  Beverages: Water. Red wine. Herbal tea.  Fats and oils: Extra virgin olive oil. Avocado oil. Grape seed oil.  Sweets and desserts: [place] yogurt with honey. Baked apples. Poached pears. Trail mix.  Seasoning and other foods: Basil. Cilantro. Coriander. Cumin. Mint.  Parsley. Sage. Rosemary. Tarragon. Garlic. Oregano. Thyme. Pepper. Balsalmic vinegar. Tahini. Hummus. Tomato sauce. Olives. Mushrooms.  Limit these  Grains: Prepackaged pasta or rice dishes. Prepackaged cereal with added sugar.  Vegetables: Deep fried potatoes (french fries).  Fruits: Fruit canned in syrup.  Meats and other protein foods: Beef. Pork. Lamb. Poultry with skin. Hot dogs. Berniece Salines.  Dairy: Ice cream. Sour cream. Whole milk.  Beverages: Juice. Sugar-sweetened soft drinks. Beer. Liquor and spirits.  Fats and oils: Butter. Canola oil. Vegetable oil. Beef fat (tallow). Lard.  Sweets and desserts: Cookies. Cakes. Pies. Candy.  Seasoning and other foods: Mayonnaise. Premade sauces and marinades.  The items listed may not be a  complete list. Talk with your dietitian about what dietary choices are right for you. Summary  The Mediterranean diet includes both food and lifestyle choices.  Eat a variety of fresh fruits and vegetables, beans, nuts, seeds, and whole grains.  Limit the amount of red meat and sweets that you eat.  Talk with your health care provider about whether it is safe for you to drink red wine in moderation. This means 1 glass a day for nonpregnant women and 2 glasses a day for men. A glass of wine equals 5 oz (150 mL). This information is not intended to replace advice given to you by your health care provider. Make sure you discuss any questions you have with your health care provider. Document Released: 09/01/2015 Document Revised: 10/04/2015 Document Reviewed: 09/01/2015 Elsevier Interactive Patient Education  2017 [employer].

## 2020-05-09 NOTE — Progress Notes (Signed)
Assessment/Plan:   Moderate dementia with behavioral disturbance  likely due to Alzheimer's disease 80 year old right-handed woman with moderate dementia with behavioral disturbance, likely due to Alzheimer's disease.  Last MMSE on 07/03/2019 was 11/30.  In today's visit, there was significant cognitive decline and trying to perform her MMSE, although she is still able to do most of her daily activities.  At this time, we will continue her current therapy as follows:  Marland Kitchen Discussed the diagnosis of dementia,likely due to Alzheimer's disease. . Discussed safety both in and out of the home. Discussed the importance of regular daily schedule with inclusion of crossword puzzles to maintain brain function.  . Continue to stay active exercising  at least 30 minutes at least 3 times a week.  . Naps should be scheduled and should be no longer than 60 minutes and should not occur after 2 PM.  . Mediterranean diet is recommended    Continue memantine 10 mg twice daily (she had side effects on donepezil)  Continue sertraline 100 mg daily for mood control  Continue Seroquel 25 mg twice daily  Continue 24/7 supervision  Follow-up in 6 months or sooner if needed  May need to consider a cane to ambulate if balance becomes worse.    Case discussed with Dr. Posey Pronto who agrees with the plan    Subjective:   [name] with a history of diabetes mellitus 2, hypertension, hyperlipidemia, seen today in follow up for memory loss.  She has a history of moderate dementia with behavioral disturbance likely due to Alzheimer's Disease.  This patient is accompanied in the office by her daughter in law [name] who supplements the history.  My previous records as well as any outside records available were reviewed prior to todays visit. Previous MMSE was 11/30.  Patient is currently on memantine 10 mg twice daily.  She is also tolerating well Sertraline at 100 mg daily for paranoia and mood changes.  She  continues to live in assisted living, and she reports she loves to  "color in paper".  She is very busy during the day, participating in many activities.The staff at [facility] admin[facility] her medications. She feels her memory is "fine, all there" She dresses and bathes with supervision. Sleeps "ok"  Goes early evening, 8-9 h , naps 30 mins.  She denies any vivid dreams.Denies hallucinations or paranoia, daughter-in-law [name] confirms that she "no longer sees her mother visiting her " mood is overall stable, although there are periods of "on and off " Her appetite is good, no trouble swallowing.  Denies diarrhea or constipation.  Denies any urinary issues. Denies any headaches dizziness focal numbness, numbness, tingling or weakness. Denies any falls, headaches or head trauma.  Denies tremors. Patient dresses up and bathes without help. Denies living objects in unusual places.The patient does not cook .  Her daughter-in-law reports that although she has slowed down in her walking, but denies yet the need to use a cane or a walker.  She has a living will, and her sons are the power of attorney's   "History on Initial Assessment 10/09/2016: This is a 80 yo RH woman with a history of hypertension, hyperlipidemia, diabetes, with mild dementia. She feels her memory is pretty good. She denies getting lost driving. She denies misplacing things, which her husband disagrees with and reminds her about losing her glasses frequently. She denies missing medications. Her husband checks after her and agrees. Her husband is in charge of finances. He started noticing  memory changes around 1.5-2 years ago, initially forgetting names, but more recently short-term memory has worsened where she cannot recall what she had for lunch. Long-term memory is good. He reports that when she is not feeling stressed or has to give the answer right away, she does pretty good. Otherwise she would lock up. She takes care of herself well, no  difficulties with dressing and bathing. No personality changes or hallucinations. Her husband reports that their daughter has been in and out of the hospital for the past 1.5-2 years, causing a lot of stress on her. She feels her mood is "okay, I guess." She was initially started on Aricept which caused a rash. She is now on Namenda 10mg  BID without side effects, her husband reports they have not noticed any difference on the medication. Her mother had memory problems. She denies any head injuries, she drinks alcohol on occasion.  Diagnostic Data: MRI brain without contrast done 11/2016 did not show any acute changes, there was moderate diffuse atrophy and mild chronic microvascular disease. "    PREVIOUS MEDICATIONS Donepezil 10 mg, unable to tolerate it   CURRENT MEDICATIONS:  Outpatient Encounter Medications as of 05/10/2020  Medication Sig  . aspirin 81 MG chewable tablet Chew 81 mg by mouth.  . Calcium Carb-Cholecalciferol (CALCIUM 1000 + D PO) Take by mouth daily.  . Cholecalciferol (VITAMIN D) 2000 UNITS CAPS Take 2,000 Units by mouth daily.  Marland Kitchen JANUVIA 100 MG tablet TAKE 1 TABLET BY MOUTH DAILY FOR DIABETES.  Marland Kitchen losartan (COZAAR) 100 MG tablet TAKE 1 TABLET BY MOUTH DAILY FOR HYPERTENSION.  . memantine (NAMENDA) 10 MG tablet TAKE 1 TABLET BY MOUTH TWO TIMES A DAY FOR DEMENTIA.  . Multiple Vitamin (MULTIVITAMIN) tablet Take 1 tablet by mouth daily.  Marland Kitchen omeprazole (PRILOSEC) 40 MG capsule TAKE 1 CAPSULE BY MOUTH DAILY. DX: GERD  . pravastatin (PRAVACHOL) 40 MG tablet TAKE 1 TABLET BY MOUTH DAILY FOR HYPERLIPIDEMIA.  Marland Kitchen QUEtiapine (SEROQUEL) 25 MG tablet   . losartan (COZAAR) 50 MG tablet Take 1 tablet by mouth daily.  Marland Kitchen MODERNA COVID-19 VACCINE 100 MCG/0.5ML injection   . ondansetron (ZOFRAN) 4 MG tablet Take by mouth.  . sertraline (ZOLOFT) 100 MG tablet Take 1 tablet (100 mg total) by mouth daily.   No facility-administered encounter medications on file as of 05/10/2020.      Objective:     PHYSICAL EXAMINATION:    VITALS:   Vitals:   05/10/20 1521  BP: 135/81  Pulse: 76  Resp: 20  SpO2: 95%  Weight: 189 lb (85.7 kg)  Height: 5' (1.524 m)    GEN:  The patient appears stated age and is in NAD. HEENT:  Normocephalic, atraumatic.   Neurological examination: alert and oriented to person only.  No aphasia or dysarthria. Fund of knowledge is reduced.  Recent and remote memory are impaired.  Attention and concentration are reduced.   Able to name objects and repeat phrases. Orientation: The patient is alert. Oriented to person only.  No aphasia or dysarthria.  Fund of knowledge is reduced.  Recent and remote memory are impaired.  Attention and concentration are reduced.  She is not able to name objects or repeat phrases.  She is unable to draw. Cranial nerves: There is good facial symmetry.The speech is fluent and clear.  Hearing is intact to conversational tone. Sensation: Sensation is intact to light touch throughout Motor: Strength is at least antigravity x4.  Movement examination: Tone: There is normal tone in  the UE/LE Abnormal movements:  no tremor.  No myoclonus.  No asterixis.   Coordination:  There is no decremation with RAM's. Gait and Station: slow and cautious, no ataxia   MMSE - Mini Mental State Exam 07/03/2019 03/05/2018 07/17/2017  Orientation to time 0 0 0  Orientation to Place 2 3 4   Registration 3 3 3   Attention/ Calculation 0 5 5  Recall 0 0 0  Language- name 2 objects 2 2 2   Language- repeat 1 1 1   Language- follow 3 step command 1 3 2   Language- read & follow direction 1 1 1   Write a sentence 0 1 1  Copy design 1 1 1   Total score 11 20 20      CBC    Component Value Date/Time   WBC 7.3 03/10/2019 1518   RBC 4.74 03/10/2019 1518   HGB 13.4 03/10/2019 1518   HCT 40.6 03/10/2019 1518   PLT 259 03/10/2019 1518   MCV 85.7 03/10/2019 1518   MCH 28.3 03/10/2019 1518   MCHC 33.0 03/10/2019 1518   RDW 12.6 03/10/2019  1518   LYMPHSABS 2,059 03/10/2019 1518   MONOABS 0.9 06/13/2017 0409   EOSABS 307 03/10/2019 1518   BASOSABS 51 03/10/2019 1518     CMP Latest Ref Rng & Units 03/10/2019 01/06/2018 06/11/2017  Glucose 65 - 99 mg/dL 134(H) 106(H) 127(H)  BUN 7 - 25 mg/dL 29(H) 25 14  Creatinine 0.60 - 0.93 mg/dL 1.29(H) 1.09(H) 1.04(H)  Sodium 135 - 146 mmol/L 140 140 140  Potassium 3.5 - 5.3 mmol/L 4.5 4.5 4.0  Chloride 98 - 110 mmol/L 105 104 106  CO2 20 - 32 mmol/L 24 26 28   Calcium 8.6 - 10.4 mg/dL 9.4 9.9 8.6(L)  Total Protein 6.1 - 8.1 g/dL 6.6 6.6 -  Total Bilirubin 0.2 - 1.2 mg/dL 0.5 0.9 -  Alkaline Phos 38 - 126 U/L - - -  AST 10 - 35 U/L 36(H) 19 -  ALT 6 - 29 U/L 48(H) 16 -       Total time spent on today's visit was 50 minutes, including both face-to-face time and nonface-to-face time.  Time included that spent on review of records (prior notes available to me/labs/imaging if pertinent), discussing treatment and goals, answering patient's questions and coordinating care.  Cc:  Susy Frizzle, MD Sharene Butters, PA-C

## 2020-05-10 ENCOUNTER — Ambulatory Visit: Payer: PPO | Admitting: Physician Assistant

## 2020-05-10 ENCOUNTER — Other Ambulatory Visit: Payer: Self-pay

## 2020-05-10 ENCOUNTER — Encounter: Payer: Self-pay | Admitting: Physician Assistant

## 2020-05-10 VITALS — BP 135/81 | HR 76 | Resp 20 | Ht 60.0 in | Wt 189.0 lb

## 2020-05-10 DIAGNOSIS — F0391 Unspecified dementia with behavioral disturbance: Secondary | ICD-10-CM

## 2020-05-10 DIAGNOSIS — F330 Major depressive disorder, recurrent, mild: Secondary | ICD-10-CM | POA: Diagnosis not present

## 2020-05-10 DIAGNOSIS — F03B18 Unspecified dementia, moderate, with other behavioral disturbance: Secondary | ICD-10-CM

## 2020-05-10 MED ORDER — MEMANTINE HCL 10 MG PO TABS: ORAL_TABLET | ORAL | 5 refills | Status: AC

## 2020-05-10 MED ORDER — QUETIAPINE FUMARATE 25 MG PO TABS: 25.0000 mg | ORAL_TABLET | Freq: Two times a day (BID) | ORAL | 5 refills | Status: AC

## 2020-05-10 MED ORDER — SERTRALINE HCL 100 MG PO TABS: 100.0000 mg | ORAL_TABLET | Freq: Every day | ORAL | 5 refills | Status: AC

## 2020-06-17 DIAGNOSIS — F919 Conduct disorder, unspecified: Secondary | ICD-10-CM | POA: Diagnosis not present

## 2020-06-17 DIAGNOSIS — F0390 Unspecified dementia without behavioral disturbance: Secondary | ICD-10-CM | POA: Diagnosis not present

## 2020-06-17 DIAGNOSIS — F338 Other recurrent depressive disorders: Secondary | ICD-10-CM | POA: Diagnosis not present

## 2020-07-12 DIAGNOSIS — M2041 Other hammer toe(s) (acquired), right foot: Secondary | ICD-10-CM | POA: Diagnosis not present

## 2020-07-12 DIAGNOSIS — M79675 Pain in left toe(s): Secondary | ICD-10-CM | POA: Diagnosis not present

## 2020-07-12 DIAGNOSIS — B351 Tinea unguium: Secondary | ICD-10-CM | POA: Diagnosis not present

## 2020-07-12 DIAGNOSIS — M2012 Hallux valgus (acquired), left foot: Secondary | ICD-10-CM | POA: Diagnosis not present

## 2020-07-12 DIAGNOSIS — E1151 Type 2 diabetes mellitus with diabetic peripheral angiopathy without gangrene: Secondary | ICD-10-CM | POA: Diagnosis not present

## 2020-07-21 DIAGNOSIS — I10 Essential (primary) hypertension: Secondary | ICD-10-CM | POA: Diagnosis not present

## 2020-07-21 DIAGNOSIS — K219 Gastro-esophageal reflux disease without esophagitis: Secondary | ICD-10-CM | POA: Diagnosis not present

## 2020-07-21 DIAGNOSIS — E782 Mixed hyperlipidemia: Secondary | ICD-10-CM | POA: Diagnosis not present

## 2020-07-21 DIAGNOSIS — F323 Major depressive disorder, single episode, severe with psychotic features: Secondary | ICD-10-CM | POA: Diagnosis not present

## 2020-07-21 DIAGNOSIS — E119 Type 2 diabetes mellitus without complications: Secondary | ICD-10-CM | POA: Diagnosis not present

## 2020-07-21 DIAGNOSIS — F0151 Vascular dementia with behavioral disturbance: Secondary | ICD-10-CM | POA: Diagnosis not present

## 2020-07-21 DIAGNOSIS — E559 Vitamin D deficiency, unspecified: Secondary | ICD-10-CM | POA: Diagnosis not present

## 2020-08-30 ENCOUNTER — Telehealth: Payer: Self-pay | Admitting: Neurology

## 2020-08-30 ENCOUNTER — Encounter: Payer: Self-pay | Admitting: Neurology

## 2020-08-30 NOTE — Telephone Encounter (Signed)
Done, thanks

## 2020-08-30 NOTE — Telephone Encounter (Signed)
Spoke to daughter, letter to be picked up at front office.

## 2020-08-30 NOTE — Telephone Encounter (Signed)
[name] called in and would like to get a letter for social security. She needs a letter stating [name] is not mentally stable and cant be on her own.Marland Kitchen also that she cant do her finances. If this is something that can be done, please call her and let her know.

## 2020-09-01 DIAGNOSIS — F338 Other recurrent depressive disorders: Secondary | ICD-10-CM | POA: Diagnosis not present

## 2020-09-01 DIAGNOSIS — F0390 Unspecified dementia without behavioral disturbance: Secondary | ICD-10-CM | POA: Diagnosis not present

## 2020-09-27 ENCOUNTER — Ambulatory Visit: Payer: PPO | Admitting: Neurology

## 2020-09-30 DIAGNOSIS — E119 Type 2 diabetes mellitus without complications: Secondary | ICD-10-CM | POA: Diagnosis not present

## 2020-09-30 DIAGNOSIS — I2510 Atherosclerotic heart disease of native coronary artery without angina pectoris: Secondary | ICD-10-CM | POA: Diagnosis not present

## 2020-09-30 DIAGNOSIS — E782 Mixed hyperlipidemia: Secondary | ICD-10-CM | POA: Diagnosis not present

## 2020-09-30 DIAGNOSIS — E559 Vitamin D deficiency, unspecified: Secondary | ICD-10-CM | POA: Diagnosis not present

## 2020-10-11 DIAGNOSIS — E559 Vitamin D deficiency, unspecified: Secondary | ICD-10-CM | POA: Diagnosis not present

## 2020-10-11 DIAGNOSIS — E0821 Diabetes mellitus due to underlying condition with diabetic nephropathy: Secondary | ICD-10-CM | POA: Diagnosis not present

## 2020-10-13 DIAGNOSIS — E559 Vitamin D deficiency, unspecified: Secondary | ICD-10-CM | POA: Diagnosis not present

## 2020-10-13 DIAGNOSIS — E119 Type 2 diabetes mellitus without complications: Secondary | ICD-10-CM | POA: Diagnosis not present

## 2020-10-27 ENCOUNTER — Encounter (HOSPITAL_BASED_OUTPATIENT_CLINIC_OR_DEPARTMENT_OTHER): Payer: Self-pay

## 2020-10-27 ENCOUNTER — Other Ambulatory Visit: Payer: Self-pay

## 2020-10-27 ENCOUNTER — Emergency Department (HOSPITAL_BASED_OUTPATIENT_CLINIC_OR_DEPARTMENT_OTHER)
Admission: EM | Admit: 2020-10-27 | Discharge: 2020-10-27 | Disposition: A | Discharge status: Home / Self Care | Payer: PPO | Attending: Emergency Medicine | Admitting: Emergency Medicine

## 2020-10-27 ENCOUNTER — Emergency Department (HOSPITAL_BASED_OUTPATIENT_CLINIC_OR_DEPARTMENT_OTHER): Payer: PPO

## 2020-10-27 DIAGNOSIS — E1122 Type 2 diabetes mellitus with diabetic chronic kidney disease: Secondary | ICD-10-CM | POA: Insufficient documentation

## 2020-10-27 DIAGNOSIS — I129 Hypertensive chronic kidney disease with stage 1 through stage 4 chronic kidney disease, or unspecified chronic kidney disease: Secondary | ICD-10-CM | POA: Diagnosis not present

## 2020-10-27 DIAGNOSIS — M25572 Pain in left ankle and joints of left foot: Secondary | ICD-10-CM | POA: Diagnosis not present

## 2020-10-27 DIAGNOSIS — M25552 Pain in left hip: Secondary | ICD-10-CM | POA: Diagnosis not present

## 2020-10-27 DIAGNOSIS — Z7984 Long term (current) use of oral hypoglycemic drugs: Secondary | ICD-10-CM | POA: Insufficient documentation

## 2020-10-27 DIAGNOSIS — W19XXXA Unspecified fall, initial encounter: Secondary | ICD-10-CM | POA: Diagnosis not present

## 2020-10-27 DIAGNOSIS — F0390 Unspecified dementia without behavioral disturbance: Secondary | ICD-10-CM | POA: Diagnosis not present

## 2020-10-27 DIAGNOSIS — Z7982 Long term (current) use of aspirin: Secondary | ICD-10-CM | POA: Insufficient documentation

## 2020-10-27 DIAGNOSIS — N183 Chronic kidney disease, stage 3 unspecified: Secondary | ICD-10-CM | POA: Insufficient documentation

## 2020-10-27 DIAGNOSIS — Z79899 Other long term (current) drug therapy: Secondary | ICD-10-CM | POA: Diagnosis not present

## 2020-10-27 DIAGNOSIS — Y9289 Other specified places as the place of occurrence of the external cause: Secondary | ICD-10-CM | POA: Insufficient documentation

## 2020-10-27 DIAGNOSIS — M25551 Pain in right hip: Secondary | ICD-10-CM | POA: Diagnosis not present

## 2020-10-27 DIAGNOSIS — M79605 Pain in left leg: Secondary | ICD-10-CM | POA: Diagnosis not present

## 2020-10-27 DIAGNOSIS — F338 Other recurrent depressive disorders: Secondary | ICD-10-CM | POA: Diagnosis not present

## 2020-10-27 DIAGNOSIS — S0990XA Unspecified injury of head, initial encounter: Secondary | ICD-10-CM | POA: Diagnosis not present

## 2020-10-27 DIAGNOSIS — M25562 Pain in left knee: Secondary | ICD-10-CM | POA: Diagnosis not present

## 2020-10-27 NOTE — ED Provider Notes (Signed)
[facility] EMERGENCY DEPT Provider Note   CSN: [id number] Arrival date & time: 10/27/20  1905     History Chief Complaint  Patient presents with   [name] is a 80 y.o. female.  Patient is a 80 year old female with a history of dementia, diabetes, CKD, hypertension, hyperlipidemia who is presenting today from her living facility due to an unwitnessed fall.  Staff at the facility found the patient lying on the floor and needed help getting up.  Patient cannot recall why she fell but she has been awake and alert the entire time she has been with staff and on the ride here.  She initially was complaining of some pain in her left hip and family present reports that in the past she has fallen and broken her hip several years ago.  Unclear if patient hit her head or lost consciousness.  She denies any neck pain at this time.  She reports the pain in her hip comes and goes.  She otherwise has no other complaints.  She does not take any anticoagulation.  Per family who are present they report the last few days she has been her normal self.  She has not had change in mental status or any frequent falling.  The history is provided by the patient.  Fall      Past Medical History:  Diagnosis Date   Abscessed tooth    Acquired vaginal enterocele 2018   s/p sacrospinal ligament fixation and posterior colporrhaphy Dr. 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   AKI (acute kidney injury) ([place]) 06/09/2017   CKD (chronic kidney disease), stage III ([place]) 06/09/2017   Closed left hip fracture, initial  encounter ([place]) 06/08/2017   Situational depression 10/11/2016   Osteopenia 05/11/2014   Acute blood loss anemia 07/29/2013   Dizzy spells 07/29/2013   Lower GI bleed 05/14/2013   GI bleed 05/14/2013   Diabetes mellitus without complication ([place])    Hypertension    Hyperlipidemia    Diverticulosis     Past Surgical History:  Procedure Laterality Date   ABDOMINAL HYSTERECTOMY     bladder tack     CHOLECYSTECTOMY     COLONOSCOPY N/A 05/18/2013   Procedure: COLONOSCOPY;  Surgeon: Wonda Horner, MD;  Location: [facility];  Service: Endoscopy;  Laterality: N/A;   DENTAL SURGERY     INTRAMEDULLARY (IM) NAIL INTERTROCHANTERIC Left 06/08/2017   Procedure: INTRAMEDULLARY (IM) NAIL LEFT HIP;  Surgeon: Renette Butters, MD;  Location: [facility];  Service: Orthopedics;  Laterality: Left;     OB History   No obstetric history on file.     Family History  Problem Relation Age of Onset   CAD Mother    Heart attack Father     Social History   Tobacco Use   Smoking status: Never   Smokeless tobacco: Never  Vaping Use   Vaping Use: Never used  Substance Use Topics   Alcohol use: Not Currently    Alcohol/week: 2.0 standard drinks    Types: 2 Shots of liquor per week  Comment: 2 drinks of gin a day   Drug use: No    Home Medications Prior to Admission medications   Medication Sig Start Date End Date Taking? Authorizing Provider  aspirin 81 MG chewable tablet Chew 81 mg by mouth.    [provider]  Calcium Carb-Cholecalciferol (CALCIUM 1000 + D PO) Take by mouth daily.    [provider]  Cholecalciferol (VITAMIN D) 2000 UNITS CAPS Take 2,000 Units by mouth daily.    [provider]  JANUVIA 100 MG tablet TAKE 1 TABLET BY MOUTH DAILY FOR DIABETES. 10/22/18   Susy Frizzle, MD  losartan (COZAAR) 100 MG tablet TAKE 1 TABLET BY MOUTH DAILY FOR HYPERTENSION. 10/22/18   Susy Frizzle, MD  losartan (COZAAR) 50 MG tablet Take 1 tablet by mouth daily.  05/05/20   [provider]  memantine (NAMENDA) 10 MG tablet TAKE 1 TABLET BY MOUTH TWO TIMES A DAY FOR DEMENTIA. 05/10/20   Rondel Jumbo, PA-C  MODERNA COVID-19 VACCINE 100 MCG/0.5ML injection  03/10/20   [provider]  Multiple Vitamin (MULTIVITAMIN) tablet Take 1 tablet by mouth daily.    [provider]  omeprazole (PRILOSEC) 40 MG capsule TAKE 1 CAPSULE BY MOUTH DAILY. DX: GERD 10/22/18   Susy Frizzle, MD  ondansetron (ZOFRAN) 4 MG tablet Take by mouth. 03/30/20   [provider]  pravastatin (PRAVACHOL) 40 MG tablet TAKE 1 TABLET BY MOUTH DAILY FOR HYPERLIPIDEMIA. 10/22/18   Susy Frizzle, MD  QUEtiapine (SEROQUEL) 25 MG tablet Take 1 tablet (25 mg total) by mouth 2 (two) times daily. 05/10/20   Rondel Jumbo, PA-C  sertraline (ZOLOFT) 100 MG tablet Take 1 tablet (100 mg total) by mouth daily. 05/10/20   Rondel Jumbo, PA-C    Allergies    Aricept Reather Littler hcl], Bactrim [sulfamethoxazole-trimethoprim], Flagyl [metronidazole], and Lisinopril  Review of Systems   Review of Systems  All other systems reviewed and are negative.  Physical Exam Updated Vital Signs BP 102/81 (BP Location: Right Arm)   Pulse 80   Temp 98.3 F (36.8 C) (Oral)   Resp 18   Ht 5\' 2"  (1.575 m)   Wt 85 kg   SpO2 100%   BMI 34.27 kg/m   Physical Exam Vitals and nursing note reviewed.  Constitutional:      General: She is in acute distress.     Appearance: She is well-developed.  HENT:     Head: Normocephalic and atraumatic.  Eyes:     Pupils: Pupils are equal, round, and reactive to light.  Cardiovascular:     Rate and Rhythm: Normal rate and regular rhythm.     Pulses: Normal pulses.     Heart sounds: Normal heart sounds. No murmur heard.   No friction rub.  Pulmonary:     Effort: Pulmonary effort is normal.     Breath sounds: Normal breath sounds. No wheezing or rales.  Abdominal:     General: Bowel sounds are normal. There is no distension.      Palpations: Abdomen is soft.     Tenderness: There is no abdominal tenderness. There is no guarding or rebound.  Musculoskeletal:        General: No tenderness. Normal range of motion.     Cervical back: Normal range of motion and neck supple. No tenderness. No spinous process tenderness or muscular tenderness.     Right lower leg: No edema.     Left lower leg: No edema.  Comments: No edema.  Able to fully range bilateral shoulders, elbows, wrists, hips, knees and ankles.  Patient is able to stand and walk does not appear to have pain  Skin:    General: Skin is warm and dry.     Findings: No rash.  Neurological:     Mental Status: She is alert and oriented to person, place, and time. Mental status is at baseline.     Cranial Nerves: No cranial nerve deficit.     Sensory: No sensory deficit.     Motor: No weakness.     Gait: Gait normal.  Psychiatric:        Behavior: Behavior normal.    ED Results / Procedures / Treatments   Labs (all labs ordered are listed, but only abnormal results are displayed) Labs Reviewed - No data to display  EKG None  Radiology CT Head Wo Contrast  Result Date: 10/27/2020 CLINICAL DATA:  Head trauma, minor (Age >= 65y).  Dementia. EXAM: CT HEAD WITHOUT CONTRAST TECHNIQUE: Contiguous axial images were obtained from the base of the skull through the vertex without intravenous contrast. COMPARISON:  MRI 12/03/2016 FINDINGS: Brain: Normal anatomic configuration. Parenchymal volume loss is commensurate with the patient's age. Mild periventricular white matter changes are present likely reflecting the sequela of small vessel ischemia. No abnormal intra or extra-axial mass lesion or fluid collection. No abnormal mass effect or midline shift. No evidence of acute intracranial hemorrhage or infarct. Ventricular size is normal. Cerebellum unremarkable. Vascular: No asymmetric hyperdense vasculature at the skull base. Skull: Intact Sinuses/Orbits: Paranasal sinuses  are clear. Orbits are unremarkable. Other: Mastoid air cells and middle ear cavities are clear. IMPRESSION: No acute intracranial injury.  No calvarial fracture. Mild senescent change. Electronically Signed   By: Fidela Salisbury M.D.   On: 10/27/2020 19:57    Procedures Procedures   Medications Ordered in ED Medications - No data to display  ED Course  I have reviewed the triage vital signs and the nursing notes.  Pertinent labs & imaging results that were available during my care of the patient were reviewed by me and considered in my medical decision making (see chart for details).    MDM Rules/Calculators/A&P                           Elderly female presenting today after an unwitnessed fall.  She initially was complaining some pain in her hip however she is able to stand from the wheelchair and walk here without any significant pain or limping.  She takes no anticoagulation.  She has no cervical tenderness and C-spine was cleared.  Unclear if patient hit her head.  We will do a CT to ensure no other acute findings.  Otherwise patient appears well and has reassuring vital signs.  No report of any change in mental status, new medications or other complaints today.  8:01 PM CT neg will d/c home.  MDM   Amount and/or Complexity of Data Reviewed Tests in the radiology section of CPT: ordered and reviewed Independent visualization of images, tracings, or specimens: yes     Final Clinical Impression(s) / ED Diagnoses Final diagnoses:  Fall, initial encounter    Rx / DC Orders ED Discharge Orders     None        Blanchie Dessert, MD 10/27/20 2001

## 2020-10-27 NOTE — ED Triage Notes (Signed)
Pt BIB EMS following a unwitnessed fall. No LOC / N / V  -  no blood thinner as per report.   Pt has hx of Dementia. She is now Aox 3 -  baseline.  Pt c/o left knee pain. Pt is from SNF.

## 2020-10-27 NOTE — Discharge Instructions (Addendum)
CAT scan of the head is normal.  Other thing else seems okay.  She has pain tomorrow she can have extra strength Tylenol 1 tablet every 6 hours.  If she starts having change in mental status, vomiting, inability to walk, fever or other concerns please return to the emergency room.

## 2020-10-29 ENCOUNTER — Emergency Department (HOSPITAL_COMMUNITY)
Admission: EM | Admit: 2020-10-29 | Discharge: 2020-10-30 | Disposition: A | Discharge status: Home / Self Care | Payer: PPO | Attending: Emergency Medicine | Admitting: Emergency Medicine

## 2020-10-29 DIAGNOSIS — R58 Hemorrhage, not elsewhere classified: Secondary | ICD-10-CM | POA: Diagnosis not present

## 2020-10-29 DIAGNOSIS — W19XXXA Unspecified fall, initial encounter: Secondary | ICD-10-CM | POA: Diagnosis not present

## 2020-10-29 DIAGNOSIS — I10 Essential (primary) hypertension: Secondary | ICD-10-CM | POA: Diagnosis not present

## 2020-10-29 DIAGNOSIS — R4182 Altered mental status, unspecified: Secondary | ICD-10-CM | POA: Diagnosis not present

## 2020-10-29 DIAGNOSIS — Z79899 Other long term (current) drug therapy: Secondary | ICD-10-CM | POA: Diagnosis not present

## 2020-10-29 DIAGNOSIS — Z7982 Long term (current) use of aspirin: Secondary | ICD-10-CM | POA: Insufficient documentation

## 2020-10-29 DIAGNOSIS — R0902 Hypoxemia: Secondary | ICD-10-CM | POA: Diagnosis not present

## 2020-10-29 DIAGNOSIS — R410 Disorientation, unspecified: Secondary | ICD-10-CM | POA: Diagnosis not present

## 2020-10-29 DIAGNOSIS — M79604 Pain in right leg: Secondary | ICD-10-CM | POA: Insufficient documentation

## 2020-10-29 DIAGNOSIS — I129 Hypertensive chronic kidney disease with stage 1 through stage 4 chronic kidney disease, or unspecified chronic kidney disease: Secondary | ICD-10-CM | POA: Diagnosis not present

## 2020-10-29 DIAGNOSIS — E1122 Type 2 diabetes mellitus with diabetic chronic kidney disease: Secondary | ICD-10-CM | POA: Insufficient documentation

## 2020-10-29 DIAGNOSIS — S022XXA Fracture of nasal bones, initial encounter for closed fracture: Secondary | ICD-10-CM | POA: Insufficient documentation

## 2020-10-29 DIAGNOSIS — W01198A Fall on same level from slipping, tripping and stumbling with subsequent striking against other object, initial encounter: Secondary | ICD-10-CM | POA: Diagnosis not present

## 2020-10-29 DIAGNOSIS — N3000 Acute cystitis without hematuria: Secondary | ICD-10-CM | POA: Insufficient documentation

## 2020-10-29 DIAGNOSIS — N183 Chronic kidney disease, stage 3 unspecified: Secondary | ICD-10-CM | POA: Diagnosis not present

## 2020-10-29 DIAGNOSIS — S0992XA Unspecified injury of nose, initial encounter: Secondary | ICD-10-CM | POA: Diagnosis present

## 2020-10-29 DIAGNOSIS — F0390 Unspecified dementia without behavioral disturbance: Secondary | ICD-10-CM | POA: Insufficient documentation

## 2020-10-29 DIAGNOSIS — Z043 Encounter for examination and observation following other accident: Secondary | ICD-10-CM | POA: Diagnosis not present

## 2020-10-29 DIAGNOSIS — M25561 Pain in right knee: Secondary | ICD-10-CM | POA: Diagnosis not present

## 2020-10-29 DIAGNOSIS — M25551 Pain in right hip: Secondary | ICD-10-CM | POA: Diagnosis not present

## 2020-10-30 ENCOUNTER — Emergency Department (HOSPITAL_COMMUNITY): Payer: PPO

## 2020-10-30 ENCOUNTER — Other Ambulatory Visit: Payer: Self-pay

## 2020-10-30 DIAGNOSIS — S022XXA Fracture of nasal bones, initial encounter for closed fracture: Secondary | ICD-10-CM | POA: Diagnosis not present

## 2020-10-30 DIAGNOSIS — N3000 Acute cystitis without hematuria: Secondary | ICD-10-CM | POA: Diagnosis not present

## 2020-10-30 DIAGNOSIS — Z043 Encounter for examination and observation following other accident: Secondary | ICD-10-CM | POA: Diagnosis not present

## 2020-10-30 DIAGNOSIS — I10 Essential (primary) hypertension: Secondary | ICD-10-CM | POA: Diagnosis not present

## 2020-10-30 DIAGNOSIS — M25551 Pain in right hip: Secondary | ICD-10-CM | POA: Diagnosis not present

## 2020-10-30 DIAGNOSIS — M25561 Pain in right knee: Secondary | ICD-10-CM | POA: Diagnosis not present

## 2020-10-30 LAB — URINALYSIS, ROUTINE W REFLEX MICROSCOPIC
Bilirubin Urine: NEGATIVE
Glucose, UA: NEGATIVE mg/dL
Ketones, ur: NEGATIVE mg/dL
Nitrite: NEGATIVE
Protein, ur: 100 mg/dL — AB
Specific Gravity, Urine: 1.006 (ref 1.005–1.030)
WBC, UA: >50 WBC/hpf — ABNORMAL HIGH (ref 0–5)
pH: 5 (ref 5.0–8.0)

## 2020-10-30 MED ORDER — CEPHALEXIN 500 MG PO CAPS
500.0000 mg | ORAL_CAPSULE | Freq: Once | ORAL | Status: AC
  Administered 2020-10-30: 500 mg via ORAL
  Filled 2020-10-30: qty 1

## 2020-10-30 MED ORDER — CEPHALEXIN 500 MG PO CAPS: 500.0000 mg | ORAL_CAPSULE | Freq: Three times a day (TID) | ORAL | 0 refills | Status: DC

## 2020-10-30 NOTE — ED Triage Notes (Signed)
Patient was at the nursing station at the nursing home then then fall hitting the wall then the concrete floor hitting her nose and right hip pain, no LOC , EMS arrived when patient was on the floor , no c/o neck pain

## 2020-10-30 NOTE — Discharge Instructions (Addendum)
You were seen today after a fall.  You sustained a nasal bone fracture.  You can follow-up with ENT or plastic surgery if you have concerns regarding appearance or nasal breathing issues.  You are also noted to have UTI.  Take antibiotics as prescribed.

## 2020-10-30 NOTE — ED Provider Notes (Signed)
[facility] [place] DEPT Provider Note   CSN: [id number] Arrival date & time: 10/29/20  2343     History Chief Complaint  Patient presents with   Fall    Possible broken nose    [name] is a 80 y.o. female.  HPI     This is a 80 year old female with a history of diabetes, hypertension, hyperlipidemia, dementia who presents following a fall.  Per EMS, patient had a witnessed fall at her memory care unit.  She was noted to hit her nose and her right hip.  Daughter is at the bedside.  She states that she has had more frequent falls recently.  She is not on any blood thinners.  Daughter reports that she is at baseline.  Patient is unable to provide any history.  Level 5 caveat for dementia  Past Medical History:  Diagnosis Date   Abscessed tooth    Acquired vaginal enterocele 2018   s/p sacrospinal ligament fixation and posterior colporrhaphy Dr. 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   AKI (acute kidney injury) ([place]) 06/09/2017   CKD (chronic kidney disease), stage III ([place]) 06/09/2017   Closed left hip fracture, initial encounter ([place]) 06/08/2017   Situational depression 10/11/2016   Osteopenia 05/11/2014   Acute blood loss anemia 07/29/2013   Dizzy spells 07/29/2013   Lower GI bleed 05/14/2013   GI bleed 05/14/2013   Diabetes mellitus without complication ([place])    Hypertension    Hyperlipidemia    Diverticulosis     Past Surgical History:  Procedure Laterality Date   ABDOMINAL HYSTERECTOMY     bladder tack     CHOLECYSTECTOMY     COLONOSCOPY N/A 05/18/2013    Procedure: COLONOSCOPY;  Surgeon: Wonda Horner, MD;  Location: [facility];  Service: Endoscopy;  Laterality: N/A;   DENTAL SURGERY     INTRAMEDULLARY (IM) NAIL INTERTROCHANTERIC Left 06/08/2017   Procedure: INTRAMEDULLARY (IM) NAIL LEFT HIP;  Surgeon: Renette Butters, MD;  Location: [facility];  Service: Orthopedics;  Laterality: Left;     OB History   No obstetric history on file.     Family History  Problem Relation Age of Onset   CAD Mother    Heart attack Father     Social History   Tobacco Use   Smoking status: Never   Smokeless tobacco: Never  Vaping Use   Vaping Use: Never used  Substance Use Topics   Alcohol use: Not Currently    Alcohol/week: 2.0 standard drinks    Types: 2 Shots of liquor per week    Comment: 2 drinks of gin a day   Drug use: No    Home Medications Prior to Admission medications   Medication Sig Start Date End Date Taking? Authorizing Provider  cephALEXin (KEFLEX) 500 MG capsule Take 1 capsule (500 mg total) by mouth 3 (three) times daily. 10/30/20  Yes Tisha Cline, Barbette Hair, MD  aspirin 81 MG chewable tablet Chew 81 mg by mouth.    [provider]  Calcium Carb-Cholecalciferol (CALCIUM 1000 + D PO) Take by mouth  daily.    [provider]  Cholecalciferol (VITAMIN D) 2000 UNITS CAPS Take 2,000 Units by mouth daily.    [provider]  JANUVIA 100 MG tablet TAKE 1 TABLET BY MOUTH DAILY FOR DIABETES. 10/22/18   Susy Frizzle, MD  losartan (COZAAR) 100 MG tablet TAKE 1 TABLET BY MOUTH DAILY FOR HYPERTENSION. 10/22/18   Susy Frizzle, MD  losartan (COZAAR) 50 MG tablet Take 1 tablet by mouth daily. 05/05/20   [provider]  memantine (NAMENDA) 10 MG tablet TAKE 1 TABLET BY MOUTH TWO TIMES A DAY FOR DEMENTIA. 05/10/20   Rondel Jumbo, PA-C  MODERNA COVID-19 VACCINE 100 MCG/0.5ML injection  03/10/20   [provider]  Multiple Vitamin (MULTIVITAMIN) tablet Take 1 tablet by mouth daily.    [provider]  omeprazole (PRILOSEC) 40 MG capsule TAKE 1 CAPSULE BY MOUTH DAILY. DX: GERD 10/22/18   Susy Frizzle, MD  ondansetron (ZOFRAN) 4 MG tablet Take by mouth. 03/30/20   [provider]  pravastatin (PRAVACHOL) 40 MG tablet TAKE 1 TABLET BY MOUTH DAILY FOR HYPERLIPIDEMIA. 10/22/18   Susy Frizzle, MD  QUEtiapine (SEROQUEL) 25 MG tablet Take 1 tablet (25 mg total) by mouth 2 (two) times daily. 05/10/20   Rondel Jumbo, PA-C  sertraline (ZOLOFT) 100 MG tablet Take 1 tablet (100 mg total) by mouth daily. 05/10/20   Rondel Jumbo, PA-C    Allergies    Aricept Reather Littler hcl], Bactrim [sulfamethoxazole-trimethoprim], Flagyl [metronidazole], and Lisinopril  Review of Systems   Review of Systems  Unable to perform ROS: Dementia   Physical Exam Updated Vital Signs BP 106/87   Temp 97.9 F (36.6 C) (Oral)   Resp 16   Ht 1.651 m (5\' 5" )   Wt 83.4 kg   SpO2 97%   BMI 30.59 kg/m   Physical Exam Vitals and nursing note reviewed.  Constitutional:      Appearance: She is well-developed.     Comments: Elderly, not ill-appearing  HENT:     Head:     Comments: Deformity noted to the nose, dried blood noted in the bilateral naris, no septal hematoma noted, abrasion over the tip of the nose, small hematoma right forehead    Mouth/Throat:     Mouth: Mucous membranes are moist.  Eyes:     Extraocular Movements: Extraocular movements intact.     Pupils: Pupils are equal, round, and reactive to light.  Neck:     Comments: No midline C-spine tenderness palpation, step-off, deformity Cardiovascular:     Rate and Rhythm: Normal rate and regular rhythm.     Heart sounds: Normal heart sounds.  Pulmonary:     Effort: Pulmonary effort is normal. No respiratory distress.     Breath sounds: No wheezing.  Abdominal:     General: Bowel sounds are normal.     Palpations: Abdomen is soft.     Tenderness: There is no abdominal tenderness.  Musculoskeletal:     Cervical back:  Neck supple.     Comments: Normal range of motion bilateral hips and knees, pain with range of motion of the right leg, no obvious deformities  Skin:    General: Skin is warm and dry.  Neurological:     Mental Status: She is alert.     Comments: Disoriented  Psychiatric:        Mood and Affect: Mood normal.    ED Results / Procedures / Treatments   Labs (all  labs ordered are listed, but only abnormal results are displayed) Labs Reviewed  URINALYSIS, ROUTINE W REFLEX MICROSCOPIC - Abnormal; Notable for the following components:      Result Value   APPearance CLOUDY (*)    Hgb urine dipstick MODERATE (*)    Protein, ur 100 (*)    Leukocytes,Ua LARGE (*)    WBC, UA >50 (*)    Bacteria, UA RARE (*)    Non Squamous Epithelial 0-5 (*)    All other components within normal limits  URINE CULTURE    EKG EKG Interpretation  Date/Time:  Sunday October 30 2020 01:20:12 EDT Ventricular Rate:  76 PR Interval:  148 QRS Duration: 97 QT Interval:  436 QTC Calculation: 491 R Axis:   -27 Text Interpretation: Sinus rhythm Borderline left axis deviation Low voltage, precordial leads Abnormal R-wave progression, early transition Borderline prolonged QT interval Confirmed by Thayer Jew [id number]) on 10/30/2020 1:26:28 AM  Radiology CT Head Wo Contrast  Result Date: 10/30/2020 CLINICAL DATA:  Fall EXAM: CT HEAD WITHOUT CONTRAST TECHNIQUE: Contiguous axial images were obtained from the base of the skull through the vertex without intravenous contrast. COMPARISON:  None. FINDINGS: Brain: There is no mass, hemorrhage or extra-axial collection. There is generalized atrophy without lobar predilection. Hypodensity of the white matter is most commonly associated with chronic microvascular disease. Vascular: No abnormal hyperdensity of the major intracranial arteries or dural venous sinuses. No intracranial atherosclerosis. Skull: The visualized skull base, calvarium and extracranial soft tissues are  normal. Sinuses/Orbits: No fluid levels or advanced mucosal thickening of the visualized paranasal sinuses. No mastoid or middle ear effusion. The orbits are normal. IMPRESSION: Chronic microvascular ischemia and generalized atrophy without acute intracranial abnormality. Electronically Signed   By: Ulyses Jarred M.D.   On: 10/30/2020 02:57   DG Knee Complete 4 Views Right  Result Date: 10/30/2020 CLINICAL DATA:  Fall, right knee pain EXAM: RIGHT KNEE - COMPLETE 4+ VIEW COMPARISON:  None. FINDINGS: Normal alignment. No fracture or dislocation. Severe tricompartmental degenerative arthritis. No effusion. Soft tissues are unremarkable. IMPRESSION: No acute fracture or dislocation. Severe tricompartmental degenerative arthritis. Electronically Signed   By: Fidela Salisbury M.D.   On: 10/30/2020 02:33   DG Hip Unilat W or Wo Pelvis 1 View Right  Result Date: 10/30/2020 CLINICAL DATA:  Fall, right hip pain EXAM: DG HIP (WITH OR WITHOUT PELVIS) 1V RIGHT COMPARISON:  None. FINDINGS: Normal alignment. No fracture or dislocation. Left hip ORIF has been performed. Hip joint spaces are preserved. Surgical clips are seen within the pelvis. IMPRESSION: No acute fracture or dislocation. Electronically Signed   By: Fidela Salisbury M.D.   On: 10/30/2020 02:35   CT Maxillofacial Wo Contrast  Result Date: 10/30/2020 CLINICAL DATA:  Fall EXAM: CT MAXILLOFACIAL WITHOUT CONTRAST TECHNIQUE: Multidetector CT imaging of the maxillofacial structures was performed. Multiplanar CT image reconstructions were also generated. COMPARISON:  None. FINDINGS: Osseous: Mildly leftwardly displaced fracture of the nasal bones. No other facial fracture. Orbits: The globes are intact. Normal appearance of the intra- and extraconal fat. Symmetric extraocular muscles. Sinuses: No fluid levels or advanced mucosal thickening. Soft tissues: Normal visualized extracranial soft tissues. Limited intracranial: Normal. IMPRESSION: Mildly leftwardly  displaced fracture of the nasal bones. Electronically Signed   By: Ulyses Jarred M.D.   On: 10/30/2020 03:01    Procedures Procedures   Medications Ordered in ED Medications  cephALEXin (KEFLEX) capsule 500 mg (has no administration in time range)    ED Course  I have  reviewed the triage vital signs and the nursing notes.  Pertinent labs & imaging results that were available during my care of the patient were reviewed by me and considered in my medical decision making (see chart for details).    MDM Rules/Calculators/A&P                           Patient presents after a fall.  She is overall nontoxic and vital signs are largely reassuring.  Patient is significant dementia history and unable to provide history.  Daughter at bedside and provides collateral information.  She does have likely a broken nose based on physical exam.  She otherwise per the daughter has been at baseline.  Daughter does report that previously her falls have been associated with UTI or intoxication.  For this reason urinalysis was obtained in addition to imaging.  Urinalysis is concerning for possible UTI with white blood cell clumps.  Will culture and treat.  Additionally, imaging obtained.  Confirms nasal bone fracture.  These are closed.  Discussed follow-up as needed with ENT or plastic surgery.  Plain films of the knee and hip are negative.  EKG shows no evidence of acute arrhythmia or ischemia.  Feel patient is safe for discharge back to her memory care unit.  Daughter states that she can transport patient.  After history, exam, and medical workup I feel the patient has been appropriately medically screened and is safe for discharge home. Pertinent diagnoses were discussed with the patient. Patient was given return precautions.  Final Clinical Impression(s) / ED Diagnoses Final diagnoses:  Fall, initial encounter  Closed fracture of nasal bone, initial encounter  Acute cystitis without hematuria    Rx / DC  Orders ED Discharge Orders          Ordered    cephALEXin (KEFLEX) 500 MG capsule  3 times daily        10/30/20 0328             Mariea Mcmartin, Barbette Hair, MD 10/30/20 [phone]

## 2020-10-31 ENCOUNTER — Encounter (HOSPITAL_COMMUNITY): Payer: Self-pay | Admitting: Emergency Medicine

## 2020-10-31 ENCOUNTER — Emergency Department (HOSPITAL_COMMUNITY)
Admission: EM | Admit: 2020-10-31 | Discharge: 2020-10-31 | Disposition: A | Discharge status: Home / Self Care | Payer: PPO | Attending: Emergency Medicine | Admitting: Emergency Medicine

## 2020-10-31 ENCOUNTER — Other Ambulatory Visit: Payer: Self-pay

## 2020-10-31 DIAGNOSIS — N183 Chronic kidney disease, stage 3 unspecified: Secondary | ICD-10-CM | POA: Insufficient documentation

## 2020-10-31 DIAGNOSIS — Z7984 Long term (current) use of oral hypoglycemic drugs: Secondary | ICD-10-CM | POA: Insufficient documentation

## 2020-10-31 DIAGNOSIS — Z7982 Long term (current) use of aspirin: Secondary | ICD-10-CM | POA: Diagnosis not present

## 2020-10-31 DIAGNOSIS — E1122 Type 2 diabetes mellitus with diabetic chronic kidney disease: Secondary | ICD-10-CM | POA: Diagnosis not present

## 2020-10-31 DIAGNOSIS — Z79899 Other long term (current) drug therapy: Secondary | ICD-10-CM | POA: Insufficient documentation

## 2020-10-31 DIAGNOSIS — F0390 Unspecified dementia without behavioral disturbance: Secondary | ICD-10-CM | POA: Diagnosis not present

## 2020-10-31 DIAGNOSIS — I129 Hypertensive chronic kidney disease with stage 1 through stage 4 chronic kidney disease, or unspecified chronic kidney disease: Secondary | ICD-10-CM | POA: Insufficient documentation

## 2020-10-31 DIAGNOSIS — I959 Hypotension, unspecified: Secondary | ICD-10-CM | POA: Insufficient documentation

## 2020-10-31 DIAGNOSIS — W19XXXA Unspecified fall, initial encounter: Secondary | ICD-10-CM | POA: Diagnosis not present

## 2020-10-31 DIAGNOSIS — R0902 Hypoxemia: Secondary | ICD-10-CM | POA: Diagnosis not present

## 2020-10-31 LAB — CBC WITH DIFFERENTIAL/PLATELET
Abs Immature Granulocytes: 0.08 10*3/uL — ABNORMAL HIGH (ref 0.00–0.07)
Basophils Absolute: 0 10*3/uL (ref 0.0–0.1)
Basophils Relative: 0 %
Eosinophils Absolute: 0 10*3/uL (ref 0.0–0.5)
Eosinophils Relative: 0 %
HCT: 33.7 % — ABNORMAL LOW (ref 36.0–46.0)
Hemoglobin: 10.7 g/dL — ABNORMAL LOW (ref 12.0–15.0)
Immature Granulocytes: 1 %
Lymphocytes Relative: 7 %
Lymphs Abs: 0.9 10*3/uL (ref 0.7–4.0)
MCH: 28.5 pg (ref 26.0–34.0)
MCHC: 31.8 g/dL (ref 30.0–36.0)
MCV: 89.9 fL (ref 80.0–100.0)
Monocytes Absolute: 0.9 10*3/uL (ref 0.1–1.0)
Monocytes Relative: 8 %
Neutro Abs: 10.1 10*3/uL — ABNORMAL HIGH (ref 1.7–7.7)
Neutrophils Relative %: 84 %
Platelets: 220 10*3/uL (ref 150–400)
RBC: 3.75 MIL/uL — ABNORMAL LOW (ref 3.87–5.11)
RDW: 13.5 % (ref 11.5–15.5)
WBC: 12 10*3/uL — ABNORMAL HIGH (ref 4.0–10.5)
nRBC: 0 % (ref 0.0–0.2)

## 2020-10-31 LAB — COMPREHENSIVE METABOLIC PANEL
ALT: 17 U/L (ref 0–44)
AST: 31 U/L (ref 15–41)
Albumin: 3.6 g/dL (ref 3.5–5.0)
Alkaline Phosphatase: 53 U/L (ref 38–126)
Anion gap: 7 (ref 5–15)
BUN: 30 mg/dL — ABNORMAL HIGH (ref 8–23)
CO2: 25 mmol/L (ref 22–32)
Calcium: 8.7 mg/dL — ABNORMAL LOW (ref 8.9–10.3)
Chloride: 103 mmol/L (ref 98–111)
Creatinine, Ser: 1.11 mg/dL — ABNORMAL HIGH (ref 0.44–1.00)
GFR, Estimated: 51 mL/min — ABNORMAL LOW (ref >60)
Glucose, Bld: 125 mg/dL — ABNORMAL HIGH (ref 70–99)
Potassium: 3.7 mmol/L (ref 3.5–5.1)
Sodium: 135 mmol/L (ref 135–145)
Total Bilirubin: 0.7 mg/dL (ref 0.3–1.2)
Total Protein: 6.8 g/dL (ref 6.5–8.1)

## 2020-10-31 MED ORDER — SODIUM CHLORIDE 0.9 % IV SOLN
INTRAVENOUS | Status: DC
  Administered 2020-10-31: 15 mL/h via INTRAVENOUS

## 2020-10-31 MED ORDER — LACTATED RINGERS IV SOLN: INTRAVENOUS | Status: DC

## 2020-10-31 MED ORDER — LACTATED RINGERS IV BOLUS
1000.0000 mL | Freq: Once | INTRAVENOUS | Status: AC
  Administered 2020-10-31: 1000 mL via INTRAVENOUS

## 2020-10-31 NOTE — ED Triage Notes (Signed)
BIBA Per EMS: Pt coming from [facility] w/ c/ o hypotension that PT noticed while doing their assessment today. Pt had a fall 2 days ago and broke her nose. BP upon EMS arrival 90/46 170 CBG  80 HR  94% RA  Manual BP taken in ED room - 98/50

## 2020-10-31 NOTE — ED Provider Notes (Signed)
[place] DEPT Provider Note   CSN: [id number] Arrival date & time: 10/31/20  1018     History No chief complaint on file.   [name] is a 80 y.o. female.  80 year old female with history of dementia presents due to low blood pressure that was found today at her facility.  Patient was scheduled to have rehab and her blood pressure was taken and found to be in the 90s.  Is at her baseline according to EMS.  She does not complain of any symptoms.  Patient CBG was 170.  Was transported here for further management     Past Medical History:  Diagnosis Date   Abscessed tooth    Acquired vaginal enterocele 2018   s/p sacrospinal ligament fixation and posterior colporrhaphy Dr. Zigmund Daniel at [facility]   CKD (chronic kidney disease), stage III ([place]) 06/09/2017   Dementia ([place])    mild mmse 05/2016 (could not remember 3 objects, unable to perform serial 7's)   Diabetes mellitus without complication ([place])    Diverticulosis    Hyperlipidemia    Hypertension     Patient Active Problem List   Diagnosis Date Noted   MDD (major depressive disorder), recurrent episode, mild ([place]) 05/10/2020   AKI (acute kidney injury) ([place]) 06/09/2017   CKD (chronic kidney disease), stage III ([place]) 06/09/2017   Closed left hip fracture, initial encounter ([place]) 06/08/2017   Situational depression 10/11/2016   Osteopenia 05/11/2014   Acute blood loss anemia 07/29/2013   Dizzy spells 07/29/2013   Lower GI bleed 05/14/2013   GI bleed 05/14/2013   Diabetes mellitus without complication ([place])    Hypertension    Hyperlipidemia    Diverticulosis     Past Surgical History:  Procedure Laterality Date   ABDOMINAL HYSTERECTOMY     bladder tack     CHOLECYSTECTOMY     COLONOSCOPY N/A 05/18/2013   Procedure: COLONOSCOPY;  Surgeon: Wonda Horner, MD;  [facility]: [facility];  Service: Endoscopy;  Laterality: N/A;   DENTAL SURGERY     INTRAMEDULLARY (IM) NAIL INTERTROCHANTERIC  Left 06/08/2017   Procedure: INTRAMEDULLARY (IM) NAIL LEFT HIP;  Surgeon: Renette Butters, MD;  Location: [facility];  Service: Orthopedics;  Laterality: Left;     OB History   No obstetric history on file.     Family History  Problem Relation Age of Onset   CAD Mother    Heart attack Father     Social History   Tobacco Use   Smoking status: Never   Smokeless tobacco: Never  Vaping Use   Vaping Use: Never used  Substance Use Topics   Alcohol use: Not Currently    Alcohol/week: 2.0 standard drinks    Types: 2 Shots of liquor per week    Comment: 2 drinks of gin a day   Drug use: No    Home Medications Prior to Admission medications   Medication Sig Start Date End Date Taking? Authorizing Provider  aspirin 81 MG chewable tablet Chew 81 mg by mouth.    [provider]  Calcium Carb-Cholecalciferol (CALCIUM 1000 + D PO) Take by mouth daily.    [provider]  cephALEXin (KEFLEX) 500 MG capsule Take 1 capsule (500 mg total) by mouth 3 (three) times daily. 10/30/20   [place], Barbette Hair, MD  Cholecalciferol (VITAMIN D) 2000 UNITS CAPS Take 2,000 Units by mouth daily.    [provider]  JANUVIA 100 MG tablet TAKE 1 TABLET BY MOUTH DAILY  FOR DIABETES. 10/22/18   Susy Frizzle, MD  losartan (COZAAR) 100 MG tablet TAKE 1 TABLET BY MOUTH DAILY FOR HYPERTENSION. 10/22/18   Susy Frizzle, MD  losartan (COZAAR) 50 MG tablet Take 1 tablet by mouth daily. 05/05/20   [provider]  memantine (NAMENDA) 10 MG tablet TAKE 1 TABLET BY MOUTH TWO TIMES A DAY FOR DEMENTIA. 05/10/20   Rondel Jumbo, PA-C  MODERNA COVID-19 VACCINE 100 MCG/0.5ML injection  03/10/20   [provider]  Multiple Vitamin (MULTIVITAMIN) tablet Take 1 tablet by mouth daily.    [provider]  omeprazole (PRILOSEC) 40 MG capsule TAKE 1 CAPSULE BY MOUTH DAILY. DX: GERD 10/22/18   Susy Frizzle, MD  ondansetron (ZOFRAN) 4 MG tablet Take by mouth. 03/30/20   [provider]  pravastatin (PRAVACHOL) 40 MG tablet TAKE 1 TABLET BY MOUTH DAILY FOR HYPERLIPIDEMIA. 10/22/18   Susy Frizzle, MD  QUEtiapine (SEROQUEL) 25 MG tablet Take 1 tablet (25 mg total) by mouth 2 (two) times daily. 05/10/20   Rondel Jumbo, PA-C  sertraline (ZOLOFT) 100 MG tablet Take 1 tablet (100 mg total) by mouth daily. 05/10/20   Rondel Jumbo, PA-C    Allergies    Aricept Reather Littler hcl], Bactrim [sulfamethoxazole-trimethoprim], Flagyl [metronidazole], and Lisinopril  Review of Systems   Review of Systems  Unable to perform ROS: Dementia   Physical Exam Updated Vital Signs There were no vitals taken for this visit.  Physical Exam Vitals and nursing note reviewed.  Constitutional:      General: She is not in acute distress.    Appearance: Normal appearance. She is well-developed. She is not toxic-appearing.  HENT:     Head: Normocephalic and atraumatic.  Eyes:     General: Lids are normal.     Conjunctiva/sclera: Conjunctivae normal.     Pupils: Pupils are equal, round, and reactive to light.  Neck:     Thyroid: No thyroid mass.     Trachea: No tracheal deviation.  Cardiovascular:     Rate and Rhythm: Normal rate and regular rhythm.     Heart sounds: Normal heart sounds. No murmur heard.   No gallop.  Pulmonary:     Effort: Pulmonary effort is normal. No respiratory distress.     Breath sounds: Normal breath sounds. No stridor. No decreased breath sounds, wheezing, rhonchi or rales.  Abdominal:     General: There is no distension.     Palpations: Abdomen is soft.     Tenderness: There is no abdominal tenderness. There is no rebound.  Musculoskeletal:        General: No tenderness. Normal range of motion.     Cervical back: Normal range of motion and neck supple.  Skin:    General: Skin is warm and dry.     Findings: No abrasion or rash.  Neurological:     General: No focal deficit present.     Mental Status: She is alert and oriented to person,  place, and time. Mental status is at baseline.     GCS: GCS eye subscore is 4. GCS verbal subscore is 5. GCS motor subscore is 6.     Cranial Nerves: Cranial nerves are intact. No cranial nerve deficit.     Sensory: No sensory deficit.     Motor: Motor function is intact.     Comments: Moves all 4 extremities at this time.    Psychiatric:        Attention  and Perception: Attention normal.        Mood and Affect: Affect is flat.        Speech: Speech normal.        Behavior: Behavior normal.   ED Results / Procedures / Treatments   Labs (all labs ordered are listed, but only abnormal results are displayed) Labs Reviewed  CBC WITH DIFFERENTIAL/PLATELET  COMPREHENSIVE METABOLIC PANEL    EKG None  Radiology CT Head Wo Contrast  Result Date: 10/30/2020 CLINICAL DATA:  Fall EXAM: CT HEAD WITHOUT CONTRAST TECHNIQUE: Contiguous axial images were obtained from the base of the skull through the vertex without intravenous contrast. COMPARISON:  None. FINDINGS: Brain: There is no mass, hemorrhage or extra-axial collection. There is generalized atrophy without lobar predilection. Hypodensity of the white matter is most commonly associated with chronic microvascular disease. Vascular: No abnormal hyperdensity of the major intracranial arteries or dural venous sinuses. No intracranial atherosclerosis. Skull: The visualized skull base, calvarium and extracranial soft tissues are normal. Sinuses/Orbits: No fluid levels or advanced mucosal thickening of the visualized paranasal sinuses. No mastoid or middle ear effusion. The orbits are normal. IMPRESSION: Chronic microvascular ischemia and generalized atrophy without acute intracranial abnormality. Electronically Signed   By: Ulyses Jarred M.D.   On: 10/30/2020 02:57   DG Knee Complete 4 Views Right  Result Date: 10/30/2020 CLINICAL DATA:  Fall, right knee pain EXAM: RIGHT KNEE - COMPLETE 4+ VIEW COMPARISON:  None. FINDINGS: Normal alignment. No fracture  or dislocation. Severe tricompartmental degenerative arthritis. No effusion. Soft tissues are unremarkable. IMPRESSION: No acute fracture or dislocation. Severe tricompartmental degenerative arthritis. Electronically Signed   By: Fidela Salisbury M.D.   On: 10/30/2020 02:33   DG Hip Unilat W or Wo Pelvis 1 View Right  Result Date: 10/30/2020 CLINICAL DATA:  Fall, right hip pain EXAM: DG HIP (WITH OR WITHOUT PELVIS) 1V RIGHT COMPARISON:  None. FINDINGS: Normal alignment. No fracture or dislocation. Left hip ORIF has been performed. Hip joint spaces are preserved. Surgical clips are seen within the pelvis. IMPRESSION: No acute fracture or dislocation. Electronically Signed   By: Fidela Salisbury M.D.   On: 10/30/2020 02:35   CT Maxillofacial Wo Contrast  Result Date: 10/30/2020 CLINICAL DATA:  Fall EXAM: CT MAXILLOFACIAL WITHOUT CONTRAST TECHNIQUE: Multidetector CT imaging of the maxillofacial structures was performed. Multiplanar CT image reconstructions were also generated. COMPARISON:  None. FINDINGS: Osseous: Mildly leftwardly displaced fracture of the nasal bones. No other facial fracture. Orbits: The globes are intact. Normal appearance of the intra- and extraconal fat. Symmetric extraocular muscles. Sinuses: No fluid levels or advanced mucosal thickening. Soft tissues: Normal visualized extracranial soft tissues. Limited intracranial: Normal. IMPRESSION: Mildly leftwardly displaced fracture of the nasal bones. Electronically Signed   By: Ulyses Jarred M.D.   On: 10/30/2020 03:01    Procedures Procedures   Medications Ordered in ED Medications  0.9 %  sodium chloride infusion (has no administration in time range)    ED Course  I have reviewed the triage vital signs and the nursing notes.  Pertinent labs & imaging results that were available during my care of the patient were reviewed by me and considered in my medical decision making (see chart for details).    MDM Rules/Calculators/A&P                           Patient's blood pressure noted here.  Labs are reassuring.  Had orthostatics x2 performed and they  are stable.  We will stop patient's losartan and have her follow-up with her doctor.  Have spoken with the patient's daughter and suggested that patient has increased supervision  Final Clinical Impression(s) / ED Diagnoses Final diagnoses:  None    Rx / DC Orders ED Discharge Orders     None        Lacretia Leigh, MD 10/31/20 1407

## 2020-10-31 NOTE — Discharge Instructions (Addendum)
Stop the losartan.  Follow-up with your doctor

## 2020-10-31 NOTE — ED Notes (Signed)
Patient struggles with following commands.

## 2020-11-01 LAB — URINE CULTURE: Culture: 20000 — AB

## 2020-11-02 ENCOUNTER — Telehealth: Payer: Self-pay | Admitting: Emergency Medicine

## 2020-11-02 NOTE — Telephone Encounter (Signed)
Post ED Visit - Positive Culture Follow-up  Culture report reviewed by antimicrobial stewardship pharmacist: [place] Team []  Elenor Quinones, Pharm.D. []  Heide Guile, Pharm.D., BCPS AQ-ID []  Parks Neptune, Pharm.D., BCPS []  Alycia Rossetti, Pharm.D., BCPS []  [place], Pharm.D., BCPS, AAHIVP []  Legrand Como, Pharm.D., BCPS, AAHIVP []  Salome Arnt, PharmD, BCPS []  Johnnette Gourd, PharmD, BCPS []  Hughes Better, PharmD, BCPS []  Leeroy Cha, PharmD []  Laqueta Linden, PharmD, BCPS []  Albertina Parr, PharmD  [place] Team []  Leodis Sias, PharmD []  Lindell Spar, PharmD []  Royetta Asal, PharmD []  Graylin Shiver, Rph []  Rema Fendt) Glennon Mac, PharmD []  Arlyn Dunning, PharmD []  Netta Cedars, PharmD []  Dia Sitter, PharmD []  Leone Haven, PharmD []  Gretta Arab, PharmD []  Theodis Shove, PharmD []  Peggyann Juba, PharmD []  Adria Dill, PharmD   Positive urine culture Treated with Cephalexin, organism sensitive to the same and no further patient follow-up is required at this time.  Sandi Raveling Natali Lavallee 11/02/2020, 10:53 AM

## 2020-11-03 DIAGNOSIS — I959 Hypotension, unspecified: Secondary | ICD-10-CM | POA: Diagnosis not present

## 2020-11-03 DIAGNOSIS — S0990XA Unspecified injury of head, initial encounter: Secondary | ICD-10-CM | POA: Diagnosis not present

## 2020-11-03 DIAGNOSIS — N390 Urinary tract infection, site not specified: Secondary | ICD-10-CM | POA: Diagnosis not present

## 2020-11-03 DIAGNOSIS — S022XXA Fracture of nasal bones, initial encounter for closed fracture: Secondary | ICD-10-CM | POA: Diagnosis not present

## 2020-11-09 ENCOUNTER — Other Ambulatory Visit: Payer: Self-pay

## 2020-11-09 ENCOUNTER — Ambulatory Visit: Payer: PPO | Admitting: Physician Assistant

## 2020-11-09 ENCOUNTER — Encounter: Payer: Self-pay | Admitting: Physician Assistant

## 2020-11-09 VITALS — BP 122/62 | HR 66 | Ht 65.0 in | Wt 183.0 lb

## 2020-11-09 DIAGNOSIS — F03B18 Unspecified dementia, moderate, with other behavioral disturbance: Secondary | ICD-10-CM

## 2020-11-09 NOTE — Progress Notes (Signed)
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 Discussed safety both in and out of the home. Discussed the importance of regular daily schedule with inclusion of crossword puzzles to maintain brain function.  Continue to stay active exercising  at least 30 minutes at least 3 times a week.  Naps should be scheduled and should be no longer than 60 minutes and should not occur after 2 PM.  Mediterranean diet is recommended   Continue memantine 10 mg twice daily (she had side effects on donepezil) Continue sertraline 100 mg daily for mood control Continue Seroquel 25 mg twice daily Continue 24/7 supervision Follow-up in 6 months or sooner if needed Continue PT, needs a walker when stronger  Agree with Palliative care to prevent frequent ER visits     Case discussed with Dr. Posey Pronto who agrees with the plan    Subjective:   [name] is a 80 yo RH woman with a history of diabetes mellitus 2, hypertension, hyperlipidemia, seen today in follow up for  moderate dementia with behavioral disturbance likely due to Alzheimer's Disease.  This patient is accompanied in the office by her daughter in law Benjamine Mola who supplements the history.  Previous records as well as any outside records available were reviewed prior to todays visit. Previous MMSE was 11/30.  Patient is currently on memantine 10 mg twice daily.  She is also tolerating well Sertraline at 100 mg daily and Seroquel 25 mg bid for mood Since her last visit, the patient had several presentations to the ED.  On one episode, she fell backwards, without loss of consciousness.  All work-up including a CT of the head was negative for acute findings.  In another opportunity, she sat forward, and fell, this was witnessed, fracturing her nose, and work-up was consistent with UTI, treated with antibiotics.  The next day,  she fell twice on the floor, although she did not get seen at the ER.  Last presentation on 10/31/2020 was  generalized weakness, due to hypotension, with a BP of 67/47, receiving 2 L of IV fluids, and her blood pressure medications were discontinued.  Since that time, no other falls were reported.  It is unclear if the patient was dehydrated or has been drinking enough water at the time.  She stays in a wheelchair for now, and is about to start physical therapy for strength and balance.  Her daughter reports that her memory has declined, unable to remember events, or associating them.  She is unable to carry a conversation.  She likes to call her books and watch TV.  She interacts to a certain extent with the other residents, although there were concerns that "some people have been hanging in her bed "per daughter-in-law's report.  It is unclear if any sexual activity took place.  She sleeps less, and "things are wandering around the facility ", her daughter-in-law got her lower to the ground bed to prevent falling.  There are no further hallucinations, she never brings the topics of her spouse or daughter again.  Occasionally she gets agitated, but is minimal, and the last time it was associated with her UTI.  She now needs assistance to bathe and dress.  She leaves objects in unusual places, like napkins in [employer], and she hides different things in different places.  For example, underwear inside different clothes.  She also brings clothing that may belong  to other residents into her room.  She hoards crackers and candies.  At the facility, she loves to help in the kitchen.  She has urine incontinence, and wears depends.  She also has had bowel incontinence and has to wear diapers at the time. Her daughter is interested in pursuing palliative care evaluation at the facility, to minimize the presentations to the emergency department, as they can visit her at [facility]    "History on Initial Assessment  10/09/2016: This is a 80 yo RH woman with a history of hypertension, hyperlipidemia, diabetes, with mild dementia. She feels her memory is pretty good. She denies getting lost driving. She denies misplacing things, which her husband disagrees with and reminds her about losing her glasses frequently. She denies missing medications. Her husband checks after her and agrees. Her husband is in charge of finances. He started noticing memory changes around 1.5-2 years ago, initially forgetting names, but more recently short-term memory has worsened where she cannot recall what she had for lunch. Long-term memory is good. He reports that when she is not feeling stressed or has to give the answer right away, she does pretty good. Otherwise she would lock up. She takes care of herself well, no difficulties with dressing and bathing. No personality changes or hallucinations. Her husband reports that their daughter has been in and out of the hospital for the past 1.5-2 years, causing a lot of stress on her. She feels her mood is "okay, I guess." She was initially started on Aricept which caused a rash. She is now on Namenda 10mg  BID without side effects, her husband reports they have not noticed any difference on the medication. Her mother had memory problems. She denies any head injuries, she drinks alcohol on occasion.    Diagnostic Data: MRI brain without contrast done 11/2016 did not show any acute changes, there was moderate diffuse atrophy and mild chronic microvascular disease. "     PREVIOUS MEDICATIONS Donepezil 10 mg, unable to tolerate it   CURRENT MEDICATIONS:  Outpatient Encounter Medications as of 11/09/2020  Medication Sig   acetaminophen (TYLENOL) 325 MG tablet Take 650 mg by mouth every 6 (six) hours as needed.   aspirin 81 MG chewable tablet Chew 81 mg by mouth.   Cholecalciferol (VITAMIN D) 2000 UNITS CAPS Take 2,000 Units by mouth daily.   JANUVIA 100 MG tablet TAKE 1 TABLET BY MOUTH DAILY FOR  DIABETES.   memantine (NAMENDA) 10 MG tablet TAKE 1 TABLET BY MOUTH TWO TIMES A DAY FOR DEMENTIA.   MODERNA COVID-19 VACCINE 100 MCG/0.5ML injection    omeprazole (PRILOSEC) 40 MG capsule TAKE 1 CAPSULE BY MOUTH DAILY. DX: GERD (Patient taking differently: 20 mg.)   ondansetron (ZOFRAN) 4 MG tablet Take by mouth.   Oyster Shell 500 MG TABS Take 500 mg by mouth. Take two tablets by mouth every day for supplement.   pravastatin (PRAVACHOL) 40 MG tablet TAKE 1 TABLET BY MOUTH DAILY FOR HYPERLIPIDEMIA.   QUEtiapine (SEROQUEL) 25 MG tablet Take 1 tablet (25 mg total) by mouth 2 (two) times daily.   sertraline (ZOLOFT) 100 MG tablet Take 1 tablet (100 mg total) by mouth daily.   [DISCONTINUED] Calcium Carb-Cholecalciferol (CALCIUM 1000 + D PO) Take by mouth daily. (Patient not taking: Reported on 11/09/2020)   [DISCONTINUED] cephALEXin (KEFLEX) 500 MG capsule Take 1 capsule (500 mg total) by mouth 3 (three) times daily. (Patient not taking: Reported on 11/09/2020)   [DISCONTINUED] Multiple Vitamin (MULTIVITAMIN) tablet Take 1 tablet  by mouth daily. (Patient not taking: Reported on 11/09/2020)   No facility-administered encounter medications on file as of 11/09/2020.     Objective:     PHYSICAL EXAMINATION:    VITALS:   Vitals:   11/09/20 1502  BP: 122/62  Pulse: 66  SpO2: 97%  Weight: 183 lb (83 kg)  Height: 5\' 5"  (1.651 m)     GEN:  The patient appears stated age and is in NAD. HEENT:  Normocephalic, atraumatic.   Neurological examination:   Orientation: The patient is alert. Oriented to person only.  No aphasia or dysarthria.  Fund of knowledge is reduced.  Recent and remote memory are impaired.  Attention and concentration are reduced.  She is not able to name objects or repeat phrases.  She does not know what year it is. Cranial nerves: There is good facial symmetry.The speech is not fluent but clear.  Hearing is intact to conversational tone. Sensation: Sensation is intact to  light touch throughout Motor: Strength is at least antigravity x4.  Movement examination: Tone: There is normal tone in the UE/LE Abnormal movements:  no tremor.  No myoclonus.  No asterixis.   Coordination:  There is no decremation with RAM's. Gait and Station: Unable to assess, the patient is in a wheelchair due to weakness  MMSE - Mini Mental State Exam 07/03/2019 03/05/2018 07/17/2017  Orientation to time 0 0 0  Orientation to Place 2 3 4   Registration 3 3 3   Attention/ Calculation 0 5 5  Recall 0 0 0  Language- name 2 objects 2 2 2   Language- repeat 1 1 1   Language- follow 3 step command 1 3 2   Language- read & follow direction 1 1 1   Write a sentence 0 1 1  Copy design 1 1 1   Total score 11 20 20      CBC    Component Value Date/Time   WBC 12.0 (H) 10/31/2020 1106   RBC 3.75 (L) 10/31/2020 1106   HGB 10.7 (L) 10/31/2020 1106   HCT 33.7 (L) 10/31/2020 1106   PLT 220 10/31/2020 1106   MCV 89.9 10/31/2020 1106   MCH 28.5 10/31/2020 1106   MCHC 31.8 10/31/2020 1106   RDW 13.5 10/31/2020 1106   LYMPHSABS 0.9 10/31/2020 1106   MONOABS 0.9 10/31/2020 1106   EOSABS 0.0 10/31/2020 1106   BASOSABS 0.0 10/31/2020 1106     CMP Latest Ref Rng & Units 10/31/2020 03/10/2019 01/06/2018  Glucose 70 - 99 mg/dL 125(H) 134(H) 106(H)  BUN 8 - 23 mg/dL 30(H) 29(H) 25  Creatinine 0.44 - 1.00 mg/dL 1.11(H) 1.29(H) 1.09(H)  Sodium 135 - 145 mmol/L 135 140 140  Potassium 3.5 - 5.1 mmol/L 3.7 4.5 4.5  Chloride 98 - 111 mmol/L 103 105 104  CO2 22 - 32 mmol/L 25 24 26   Calcium 8.9 - 10.3 mg/dL 8.7(L) 9.4 9.9  Total Protein 6.5 - 8.1 g/dL 6.8 6.6 6.6  Total Bilirubin 0.3 - 1.2 mg/dL 0.7 0.5 0.9  Alkaline Phos 38 - 126 U/L 53 - -  AST 15 - 41 U/L 31 36(H) 19  ALT 0 - 44 U/L 17 48(H) 16       Total time spent on today's visit was  40 minutes, including both face-to-face time and nonface-to-face time.  Time included that spent on review of records (prior notes available to me/labs/imaging  if pertinent), discussing treatment and goals, answering patient's questions and coordinating care.  Cc:  System, Provider Not In Sharene Butters,  PA-C

## 2020-11-09 NOTE — Patient Instructions (Signed)
.It was a pleasure to see you today at our office.   Recommendations:  Continue memantine 10 mg twice daily . Continue sertraline 100 mg daily for mood control.  Continue Seroquel 25 mg tablet twice daily.  Agree with Palliative Care evaluation  Follow-up in 6 months  RECOMMENDATIONS FOR ALL PATIENTS WITH MEMORY PROBLEMS: 1. Continue to exercise (Recommend 30 minutes of walking everyday, or 3 hours every week) 2. Increase social interactions - continue going to [place] and enjoy social gatherings with friends and family 3. Eat healthy, avoid fried foods and eat more fruits and vegetables 4. Maintain adequate blood pressure, blood sugar, and blood cholesterol level. Reducing the risk of stroke and cardiovascular disease also helps promoting better memory. 5. Avoid stressful situations. Live a simple life and avoid aggravations. Organize your time and prepare for the next day in anticipation. 6. Sleep well, avoid any interruptions of sleep and avoid any distractions in the bedroom that may interfere with adequate sleep quality 7. Avoid sugar, avoid sweets as there is a strong link between excessive sugar intake, diabetes, and cognitive impairment We discussed the Mediterranean diet, which has been shown to help patients reduce the risk of progressive memory disorders and reduces cardiovascular risk. This includes eating fish, eat fruits and green leafy vegetables, nuts like almonds and hazelnuts, walnuts, and also use olive oil. Avoid fast foods and fried foods as much as possible. Avoid sweets and sugar as sugar use has been linked to worsening of memory function.  There is always a concern of gradual progression of memory problems. If this is the case, then we may need to adjust level of care according to patient needs. Support, both to the patient and caregiver, should then be put into place.      FALL PRECAUTIONS: Be cautious when walking. Scan the area for obstacles that may increase the  risk of trips and falls. When getting up in the mornings, sit up at the edge of the bed for a few minutes before getting out of bed. Consider elevating the bed at the head end to avoid drop of blood pressure when getting up. Walk always in a well-lit room (use night lights in the walls). Avoid area rugs or power cords from appliances in the middle of the walkways. Use a walker or a cane if necessary and consider physical therapy for balance exercise. Get your eyesight checked regularly.     Mediterranean Diet A Mediterranean diet refers to food and lifestyle choices that are based on the traditions of countries located on the [employer]. This way of eating has been shown to help prevent certain conditions and improve outcomes for people who have chronic diseases, like kidney disease and heart disease. What are tips for following this plan? Lifestyle  Cook and eat meals together with your family, when possible. Drink enough fluid to keep your urine clear or pale yellow. Be physically active every day. This includes: Aerobic exercise like running or swimming. Leisure activities like gardening, walking, or housework. Get 7-8 hours of sleep each night. If recommended by your health care provider, drink red wine in moderation. This means 1 glass a day for nonpregnant women and 2 glasses a day for men. A glass of wine equals 5 oz (150 mL). Reading food labels  Check the serving size of packaged foods. For foods such as rice and pasta, the serving size refers to the amount of cooked product, not dry. Check the total fat in packaged foods. Avoid foods  that have saturated fat or trans fats. Check the ingredients list for added sugars, such as corn syrup. Shopping  At the grocery store, buy most of your food from the areas near the walls of the store. This includes: Fresh fruits and vegetables (produce). Grains, beans, nuts, and seeds. Some of these may be available in unpackaged forms or large  amounts (in bulk). Fresh seafood. Poultry and eggs. Low-fat dairy products. Buy whole ingredients instead of prepackaged foods. Buy fresh fruits and vegetables in-season from local farmers markets. Buy frozen fruits and vegetables in resealable bags. If you do not have access to quality fresh seafood, buy precooked frozen shrimp or canned fish, such as tuna, salmon, or sardines. Buy small amounts of raw or cooked vegetables, salads, or olives from the deli or salad bar at your store. Stock your pantry so you always have certain foods on hand, such as olive oil, canned tuna, canned tomatoes, rice, pasta, and beans. Cooking  Cook foods with extra-virgin olive oil instead of using butter or other vegetable oils. Have meat as a side dish, and have vegetables or grains as your main dish. This means having meat in small portions or adding small amounts of meat to foods like pasta or stew. Use beans or vegetables instead of meat in common dishes like chili or lasagna. Experiment with different cooking methods. Try roasting or broiling vegetables instead of steaming or sauteing them. Add frozen vegetables to soups, stews, pasta, or rice. Add nuts or seeds for added healthy fat at each meal. You can add these to yogurt, salads, or vegetable dishes. Marinate fish or vegetables using olive oil, lemon juice, garlic, and fresh herbs. Meal planning  Plan to eat 1 vegetarian meal one day each week. Try to work up to 2 vegetarian meals, if possible. Eat seafood 2 or more times a week. Have healthy snacks readily available, such as: Vegetable sticks with hummus. Greek yogurt. Fruit and nut trail mix. Eat balanced meals throughout the week. This includes: Fruit: 2-3 servings a day Vegetables: 4-5 servings a day Low-fat dairy: 2 servings a day Fish, poultry, or lean meat: 1 serving a day Beans and legumes: 2 or more servings a week Nuts and seeds: 1-2 servings a day Whole grains: 6-8 servings a  day Extra-virgin olive oil: 3-4 servings a day Limit red meat and sweets to only a few servings a month What are my food choices? Mediterranean diet Recommended Grains: Whole-grain pasta. Brown rice. Bulgar wheat. Polenta. Couscous. Whole-wheat bread. [place]. Vegetables: Artichokes. Beets. Broccoli. Cabbage. Carrots. Eggplant. Green beans. Chard. Kale. Spinach. Onions. Leeks. Peas. Squash. Tomatoes. Peppers. Radishes. Fruits: Apples. Apricots. Avocado. Berries. Bananas. Cherries. Dates. Figs. Grapes. Lemons. Melon. Oranges. Peaches. Plums. Pomegranate. Meats and other protein foods: Beans. Almonds. Sunflower seeds. Pine nuts. Peanuts. [place]. Salmon. Scallops. Shrimp. [place]. Tilapia. Clams. Oysters. Eggs. Dairy: Low-fat milk. Cheese. Greek yogurt. Beverages: Water. Red wine. Herbal tea. Fats and oils: Extra virgin olive oil. Avocado oil. Grape seed oil. Sweets and desserts: [place] yogurt with honey. Baked apples. Poached pears. Trail mix. Seasoning and other foods: Basil. Cilantro. Coriander. Cumin. Mint. Parsley. Sage. Rosemary. Tarragon. Garlic. Oregano. Thyme. Pepper. Balsalmic vinegar. Tahini. Hummus. Tomato sauce. Olives. Mushrooms. Limit these Grains: Prepackaged pasta or rice dishes. Prepackaged cereal with added sugar. Vegetables: Deep fried potatoes (french fries). Fruits: Fruit canned in syrup. Meats and other protein foods: Beef. Pork. Lamb. Poultry with skin. Hot dogs. [place] Salines. Dairy: Ice cream. Sour cream. Whole milk. Beverages: Juice. Sugar-sweetened soft drinks.  Beer. Liquor and spirits. Fats and oils: Butter. Canola oil. Vegetable oil. Beef fat (tallow). Lard. Sweets and desserts: Cookies. Cakes. Pies. Candy. Seasoning and other foods: Mayonnaise. Premade sauces and marinades. The items listed may not be a complete list. Talk with your dietitian about what dietary choices are right for you. Summary The Mediterranean diet includes both food and lifestyle choices. Eat a  variety of fresh fruits and vegetables, beans, nuts, seeds, and whole grains. Limit the amount of red meat and sweets that you eat. Talk with your health care provider about whether it is safe for you to drink red wine in moderation. This means 1 glass a day for nonpregnant women and 2 glasses a day for men. A glass of wine equals 5 oz (150 mL). This information is not intended to replace advice given to you by your health care provider. Make sure you discuss any questions you have with your health care provider. Document Released: 09/01/2015 Document Revised: 10/04/2015 Document Reviewed: 09/01/2015 Elsevier Interactive Patient Education  2017 [employer].

## 2020-11-11 DIAGNOSIS — I959 Hypotension, unspecified: Secondary | ICD-10-CM | POA: Diagnosis not present

## 2020-11-11 DIAGNOSIS — K5790 Diverticulosis of intestine, part unspecified, without perforation or abscess without bleeding: Secondary | ICD-10-CM | POA: Diagnosis not present

## 2020-11-11 DIAGNOSIS — E785 Hyperlipidemia, unspecified: Secondary | ICD-10-CM | POA: Diagnosis not present

## 2020-11-11 DIAGNOSIS — E1122 Type 2 diabetes mellitus with diabetic chronic kidney disease: Secondary | ICD-10-CM | POA: Diagnosis not present

## 2020-11-11 DIAGNOSIS — M1711 Unilateral primary osteoarthritis, right knee: Secondary | ICD-10-CM | POA: Diagnosis not present

## 2020-11-11 DIAGNOSIS — N183 Chronic kidney disease, stage 3 unspecified: Secondary | ICD-10-CM | POA: Diagnosis not present

## 2020-11-11 DIAGNOSIS — Z7984 Long term (current) use of oral hypoglycemic drugs: Secondary | ICD-10-CM | POA: Diagnosis not present

## 2020-11-11 DIAGNOSIS — F32A Depression, unspecified: Secondary | ICD-10-CM | POA: Diagnosis not present

## 2020-11-11 DIAGNOSIS — F0390 Unspecified dementia without behavioral disturbance: Secondary | ICD-10-CM | POA: Diagnosis not present

## 2020-11-11 DIAGNOSIS — Z87440 Personal history of urinary (tract) infections: Secondary | ICD-10-CM | POA: Diagnosis not present

## 2020-11-11 DIAGNOSIS — S0990XD Unspecified injury of head, subsequent encounter: Secondary | ICD-10-CM | POA: Diagnosis not present

## 2020-11-11 DIAGNOSIS — Z7982 Long term (current) use of aspirin: Secondary | ICD-10-CM | POA: Diagnosis not present

## 2020-11-11 DIAGNOSIS — I129 Hypertensive chronic kidney disease with stage 1 through stage 4 chronic kidney disease, or unspecified chronic kidney disease: Secondary | ICD-10-CM | POA: Diagnosis not present

## 2020-11-11 DIAGNOSIS — Z9181 History of falling: Secondary | ICD-10-CM | POA: Diagnosis not present

## 2020-11-11 DIAGNOSIS — M800AXD Age-related osteoporosis with current pathological fracture, other site, subsequent encounter for fracture with routine healing: Secondary | ICD-10-CM | POA: Diagnosis not present

## 2020-11-24 DIAGNOSIS — I129 Hypertensive chronic kidney disease with stage 1 through stage 4 chronic kidney disease, or unspecified chronic kidney disease: Secondary | ICD-10-CM | POA: Diagnosis not present

## 2020-11-24 DIAGNOSIS — Z87440 Personal history of urinary (tract) infections: Secondary | ICD-10-CM | POA: Diagnosis not present

## 2020-11-24 DIAGNOSIS — M800AXD Age-related osteoporosis with current pathological fracture, other site, subsequent encounter for fracture with routine healing: Secondary | ICD-10-CM | POA: Diagnosis not present

## 2020-11-24 DIAGNOSIS — F32A Depression, unspecified: Secondary | ICD-10-CM | POA: Diagnosis not present

## 2020-11-24 DIAGNOSIS — E1122 Type 2 diabetes mellitus with diabetic chronic kidney disease: Secondary | ICD-10-CM | POA: Diagnosis not present

## 2020-11-24 DIAGNOSIS — Z7982 Long term (current) use of aspirin: Secondary | ICD-10-CM | POA: Diagnosis not present

## 2020-11-24 DIAGNOSIS — Z7984 Long term (current) use of oral hypoglycemic drugs: Secondary | ICD-10-CM | POA: Diagnosis not present

## 2020-11-24 DIAGNOSIS — N183 Chronic kidney disease, stage 3 unspecified: Secondary | ICD-10-CM | POA: Diagnosis not present

## 2020-11-24 DIAGNOSIS — Z9181 History of falling: Secondary | ICD-10-CM | POA: Diagnosis not present

## 2020-11-24 DIAGNOSIS — S0990XD Unspecified injury of head, subsequent encounter: Secondary | ICD-10-CM | POA: Diagnosis not present

## 2020-11-24 DIAGNOSIS — E785 Hyperlipidemia, unspecified: Secondary | ICD-10-CM | POA: Diagnosis not present

## 2020-11-24 DIAGNOSIS — F0390 Unspecified dementia without behavioral disturbance: Secondary | ICD-10-CM | POA: Diagnosis not present

## 2020-11-24 DIAGNOSIS — I959 Hypotension, unspecified: Secondary | ICD-10-CM | POA: Diagnosis not present

## 2020-11-24 DIAGNOSIS — M1711 Unilateral primary osteoarthritis, right knee: Secondary | ICD-10-CM | POA: Diagnosis not present

## 2020-11-24 DIAGNOSIS — K5790 Diverticulosis of intestine, part unspecified, without perforation or abscess without bleeding: Secondary | ICD-10-CM | POA: Diagnosis not present

## 2020-12-01 DIAGNOSIS — I129 Hypertensive chronic kidney disease with stage 1 through stage 4 chronic kidney disease, or unspecified chronic kidney disease: Secondary | ICD-10-CM | POA: Diagnosis not present

## 2020-12-01 DIAGNOSIS — N183 Chronic kidney disease, stage 3 unspecified: Secondary | ICD-10-CM | POA: Diagnosis not present

## 2020-12-01 DIAGNOSIS — M1711 Unilateral primary osteoarthritis, right knee: Secondary | ICD-10-CM | POA: Diagnosis not present

## 2020-12-01 DIAGNOSIS — S0990XD Unspecified injury of head, subsequent encounter: Secondary | ICD-10-CM | POA: Diagnosis not present

## 2020-12-01 DIAGNOSIS — Z7984 Long term (current) use of oral hypoglycemic drugs: Secondary | ICD-10-CM | POA: Diagnosis not present

## 2020-12-01 DIAGNOSIS — Z7982 Long term (current) use of aspirin: Secondary | ICD-10-CM | POA: Diagnosis not present

## 2020-12-01 DIAGNOSIS — I959 Hypotension, unspecified: Secondary | ICD-10-CM | POA: Diagnosis not present

## 2020-12-01 DIAGNOSIS — Z87440 Personal history of urinary (tract) infections: Secondary | ICD-10-CM | POA: Diagnosis not present

## 2020-12-01 DIAGNOSIS — F32A Depression, unspecified: Secondary | ICD-10-CM | POA: Diagnosis not present

## 2020-12-01 DIAGNOSIS — K5790 Diverticulosis of intestine, part unspecified, without perforation or abscess without bleeding: Secondary | ICD-10-CM | POA: Diagnosis not present

## 2020-12-01 DIAGNOSIS — F0390 Unspecified dementia without behavioral disturbance: Secondary | ICD-10-CM | POA: Diagnosis not present

## 2020-12-01 DIAGNOSIS — E785 Hyperlipidemia, unspecified: Secondary | ICD-10-CM | POA: Diagnosis not present

## 2020-12-01 DIAGNOSIS — Z9181 History of falling: Secondary | ICD-10-CM | POA: Diagnosis not present

## 2020-12-01 DIAGNOSIS — M800AXD Age-related osteoporosis with current pathological fracture, other site, subsequent encounter for fracture with routine healing: Secondary | ICD-10-CM | POA: Diagnosis not present

## 2020-12-01 DIAGNOSIS — E1122 Type 2 diabetes mellitus with diabetic chronic kidney disease: Secondary | ICD-10-CM | POA: Diagnosis not present

## 2020-12-05 DIAGNOSIS — K5790 Diverticulosis of intestine, part unspecified, without perforation or abscess without bleeding: Secondary | ICD-10-CM | POA: Diagnosis not present

## 2020-12-05 DIAGNOSIS — E1122 Type 2 diabetes mellitus with diabetic chronic kidney disease: Secondary | ICD-10-CM | POA: Diagnosis not present

## 2020-12-05 DIAGNOSIS — Z87440 Personal history of urinary (tract) infections: Secondary | ICD-10-CM | POA: Diagnosis not present

## 2020-12-05 DIAGNOSIS — E785 Hyperlipidemia, unspecified: Secondary | ICD-10-CM | POA: Diagnosis not present

## 2020-12-05 DIAGNOSIS — Z7984 Long term (current) use of oral hypoglycemic drugs: Secondary | ICD-10-CM | POA: Diagnosis not present

## 2020-12-05 DIAGNOSIS — Z7982 Long term (current) use of aspirin: Secondary | ICD-10-CM | POA: Diagnosis not present

## 2020-12-05 DIAGNOSIS — S0990XD Unspecified injury of head, subsequent encounter: Secondary | ICD-10-CM | POA: Diagnosis not present

## 2020-12-05 DIAGNOSIS — I959 Hypotension, unspecified: Secondary | ICD-10-CM | POA: Diagnosis not present

## 2020-12-05 DIAGNOSIS — M800AXD Age-related osteoporosis with current pathological fracture, other site, subsequent encounter for fracture with routine healing: Secondary | ICD-10-CM | POA: Diagnosis not present

## 2020-12-05 DIAGNOSIS — N183 Chronic kidney disease, stage 3 unspecified: Secondary | ICD-10-CM | POA: Diagnosis not present

## 2020-12-05 DIAGNOSIS — Z9181 History of falling: Secondary | ICD-10-CM | POA: Diagnosis not present

## 2020-12-05 DIAGNOSIS — I129 Hypertensive chronic kidney disease with stage 1 through stage 4 chronic kidney disease, or unspecified chronic kidney disease: Secondary | ICD-10-CM | POA: Diagnosis not present

## 2020-12-05 DIAGNOSIS — M1711 Unilateral primary osteoarthritis, right knee: Secondary | ICD-10-CM | POA: Diagnosis not present

## 2020-12-05 DIAGNOSIS — F0390 Unspecified dementia without behavioral disturbance: Secondary | ICD-10-CM | POA: Diagnosis not present

## 2020-12-05 DIAGNOSIS — F32A Depression, unspecified: Secondary | ICD-10-CM | POA: Diagnosis not present

## 2020-12-13 DIAGNOSIS — E559 Vitamin D deficiency, unspecified: Secondary | ICD-10-CM | POA: Diagnosis not present

## 2021-01-03 DIAGNOSIS — F323 Major depressive disorder, single episode, severe with psychotic features: Secondary | ICD-10-CM | POA: Diagnosis not present

## 2021-01-03 DIAGNOSIS — K219 Gastro-esophageal reflux disease without esophagitis: Secondary | ICD-10-CM | POA: Diagnosis not present

## 2021-01-03 DIAGNOSIS — E119 Type 2 diabetes mellitus without complications: Secondary | ICD-10-CM | POA: Diagnosis not present

## 2021-01-03 DIAGNOSIS — E559 Vitamin D deficiency, unspecified: Secondary | ICD-10-CM | POA: Diagnosis not present

## 2021-01-12 DIAGNOSIS — F39 Unspecified mood [affective] disorder: Secondary | ICD-10-CM | POA: Diagnosis not present

## 2021-01-12 DIAGNOSIS — F0390 Unspecified dementia without behavioral disturbance: Secondary | ICD-10-CM | POA: Diagnosis not present

## 2021-01-12 DIAGNOSIS — F338 Other recurrent depressive disorders: Secondary | ICD-10-CM | POA: Diagnosis not present

## 2021-01-21 DIAGNOSIS — R300 Dysuria: Secondary | ICD-10-CM | POA: Diagnosis not present

## 2021-02-13 DIAGNOSIS — B351 Tinea unguium: Secondary | ICD-10-CM | POA: Diagnosis not present

## 2021-02-13 DIAGNOSIS — L84 Corns and callosities: Secondary | ICD-10-CM | POA: Diagnosis not present

## 2021-02-13 DIAGNOSIS — M2041 Other hammer toe(s) (acquired), right foot: Secondary | ICD-10-CM | POA: Diagnosis not present

## 2021-02-13 DIAGNOSIS — E1151 Type 2 diabetes mellitus with diabetic peripheral angiopathy without gangrene: Secondary | ICD-10-CM | POA: Diagnosis not present

## 2021-02-13 DIAGNOSIS — M79675 Pain in left toe(s): Secondary | ICD-10-CM | POA: Diagnosis not present

## 2021-02-13 DIAGNOSIS — M2012 Hallux valgus (acquired), left foot: Secondary | ICD-10-CM | POA: Diagnosis not present

## 2021-02-27 ENCOUNTER — Emergency Department (HOSPITAL_COMMUNITY)
Admission: EM | Admit: 2021-02-27 | Discharge: 2021-02-27 | Disposition: A | Discharge status: Skilled Nursing Facility | Payer: PPO | Attending: Emergency Medicine | Admitting: Emergency Medicine

## 2021-02-27 ENCOUNTER — Other Ambulatory Visit: Payer: Self-pay

## 2021-02-27 ENCOUNTER — Encounter (HOSPITAL_COMMUNITY): Payer: Self-pay

## 2021-02-27 ENCOUNTER — Emergency Department (HOSPITAL_COMMUNITY): Payer: PPO

## 2021-02-27 DIAGNOSIS — M7989 Other specified soft tissue disorders: Secondary | ICD-10-CM | POA: Diagnosis not present

## 2021-02-27 DIAGNOSIS — Z7982 Long term (current) use of aspirin: Secondary | ICD-10-CM | POA: Diagnosis not present

## 2021-02-27 DIAGNOSIS — S0990XA Unspecified injury of head, initial encounter: Secondary | ICD-10-CM | POA: Diagnosis not present

## 2021-02-27 DIAGNOSIS — M4312 Spondylolisthesis, cervical region: Secondary | ICD-10-CM | POA: Diagnosis not present

## 2021-02-27 DIAGNOSIS — F0390 Unspecified dementia without behavioral disturbance: Secondary | ICD-10-CM | POA: Diagnosis not present

## 2021-02-27 DIAGNOSIS — R9082 White matter disease, unspecified: Secondary | ICD-10-CM | POA: Diagnosis not present

## 2021-02-27 DIAGNOSIS — Y92008 Other place in unspecified non-institutional (private) residence as the place of occurrence of the external cause: Secondary | ICD-10-CM | POA: Insufficient documentation

## 2021-02-27 DIAGNOSIS — R58 Hemorrhage, not elsewhere classified: Secondary | ICD-10-CM | POA: Diagnosis not present

## 2021-02-27 DIAGNOSIS — W19XXXA Unspecified fall, initial encounter: Secondary | ICD-10-CM | POA: Diagnosis not present

## 2021-02-27 DIAGNOSIS — Z23 Encounter for immunization: Secondary | ICD-10-CM | POA: Insufficient documentation

## 2021-02-27 DIAGNOSIS — R9431 Abnormal electrocardiogram [ECG] [EKG]: Secondary | ICD-10-CM | POA: Diagnosis not present

## 2021-02-27 DIAGNOSIS — R4020 Unspecified coma: Secondary | ICD-10-CM | POA: Diagnosis not present

## 2021-02-27 DIAGNOSIS — S0101XA Laceration without foreign body of scalp, initial encounter: Secondary | ICD-10-CM | POA: Insufficient documentation

## 2021-02-27 DIAGNOSIS — R531 Weakness: Secondary | ICD-10-CM | POA: Insufficient documentation

## 2021-02-27 LAB — CBC WITH DIFFERENTIAL/PLATELET
Abs Immature Granulocytes: 0.06 10*3/uL (ref 0.00–0.07)
Basophils Absolute: 0 10*3/uL (ref 0.0–0.1)
Basophils Relative: 0 %
Eosinophils Absolute: 0.1 10*3/uL (ref 0.0–0.5)
Eosinophils Relative: 1 %
HCT: 42.2 % (ref 36.0–46.0)
Hemoglobin: 13.4 g/dL (ref 12.0–15.0)
Immature Granulocytes: 1 %
Lymphocytes Relative: 17 %
Lymphs Abs: 1.7 10*3/uL (ref 0.7–4.0)
MCH: 27.9 pg (ref 26.0–34.0)
MCHC: 31.8 g/dL (ref 30.0–36.0)
MCV: 87.7 fL (ref 80.0–100.0)
Monocytes Absolute: 0.6 10*3/uL (ref 0.1–1.0)
Monocytes Relative: 6 %
Neutro Abs: 7.7 10*3/uL (ref 1.7–7.7)
Neutrophils Relative %: 75 %
Platelets: 227 10*3/uL (ref 150–400)
RBC: 4.81 MIL/uL (ref 3.87–5.11)
RDW: 14.9 % (ref 11.5–15.5)
WBC: 10.2 10*3/uL (ref 4.0–10.5)
nRBC: 0 % (ref 0.0–0.2)

## 2021-02-27 LAB — BASIC METABOLIC PANEL
Anion gap: 12 (ref 5–15)
BUN: 23 mg/dL (ref 8–23)
CO2: 25 mmol/L (ref 22–32)
Calcium: 9.2 mg/dL (ref 8.9–10.3)
Chloride: 100 mmol/L (ref 98–111)
Creatinine, Ser: 0.99 mg/dL (ref 0.44–1.00)
GFR, Estimated: 58 mL/min — ABNORMAL LOW (ref >60)
Glucose, Bld: 87 mg/dL (ref 70–99)
Potassium: 3.4 mmol/L — ABNORMAL LOW (ref 3.5–5.1)
Sodium: 137 mmol/L (ref 135–145)

## 2021-02-27 MED ORDER — POTASSIUM CHLORIDE CRYS ER 20 MEQ PO TBCR
20.0000 meq | EXTENDED_RELEASE_TABLET | Freq: Once | ORAL | Status: AC
  Administered 2021-02-27: 20 meq via ORAL
  Filled 2021-02-27: qty 1

## 2021-02-27 MED ORDER — TETANUS-DIPHTH-ACELL PERTUSSIS 5-2.5-18.5 LF-MCG/0.5 IM SUSY
0.5000 mL | PREFILLED_SYRINGE | Freq: Once | INTRAMUSCULAR | Status: AC
  Administered 2021-02-27: 0.5 mL via INTRAMUSCULAR
  Filled 2021-02-27: qty 0.5

## 2021-02-27 MED ORDER — SODIUM CHLORIDE 0.9 % IV BOLUS
1000.0000 mL | Freq: Once | INTRAVENOUS | Status: AC
  Administered 2021-02-27: 1000 mL via INTRAVENOUS

## 2021-02-27 NOTE — ED Triage Notes (Signed)
Per EMS, Pt, from [facility], presents after an unwitnessed fall.  Pt has a posterior head lac and bleeding is controlled.  Pt is not on thinners.  Baseline A & Ox1.

## 2021-02-27 NOTE — ED Notes (Signed)
Pt ambulatory in hallway with this RN and pt's son. Denies any lightheadedness or dizziness. Reports leg pain when ambulating. Pt placed back in bed and warm blankets given.

## 2021-02-27 NOTE — ED Provider Notes (Signed)
[facility] EMERGENCY DEPARTMENT Provider Note   CSN: [id number] Arrival date & time: 02/27/21  8676     History  Chief Complaint  Patient presents with   Fall   Head Laceration    [name] is a 81 y.o. female.  HPI 81 year old female presents from [place] at [facility] for a fall and head injury. Brought in by EMS. History is unobtainable from patient, as she doesn't know what happened and has dementia. I spoke to her nurse at [facility]. Staff found on her back in her living room with blood on pillow.  She was last seen by staff at 4 am, then they found her at 6 am.  She knows her name typically, and seems at baseline.  She couldn't stand up with EMS, normally walks without assistance. Seemed like it was primarily due to weakness, there was no evidence of pain limiting her. No recent signs of illness.  They saw blood in her room and it looked like she was going from her bedroom to the living room.  Has a scalp laceration.  When I specifically ask about areas of pain such as headache, neck pain, chest pain she denies pain in all these areas.   Home Medications Prior to Admission medications   Medication Sig Start Date End Date Taking? Authorizing Provider  acetaminophen (TYLENOL) 325 MG tablet Take 650 mg by mouth 3 (three) times daily.   Yes [provider]  aspirin 81 MG chewable tablet Chew 81 mg by mouth every morning.   Yes [provider]  Cholecalciferol (VITAMIN D) 2000 UNITS CAPS Take 4,000 Units by mouth every morning.   Yes [provider]  JANUVIA 100 MG tablet TAKE 1 TABLET BY MOUTH DAILY FOR DIABETES. Patient taking differently: Take 100 mg by mouth every morning. For diabetes 10/22/18  Yes Susy Frizzle, MD  losartan (COZAAR) 25 MG tablet Take 25 mg by mouth every morning. Check blood pressure before giving and hold for SBP <110   Yes [provider]  memantine (NAMENDA) 10 MG tablet TAKE 1 TABLET BY  MOUTH TWO TIMES A DAY FOR DEMENTIA. Patient taking differently: Take 10 mg by mouth 2 (two) times daily. For dementia 05/10/20  Yes Shawn Route, Coralee Pesa, PA-C  omeprazole (PRILOSEC) 20 MG capsule Take 20 mg by mouth every morning.   Yes [provider]  ondansetron (ZOFRAN) 4 MG tablet Take 4 mg by mouth every 6 (six) hours as needed for nausea or vomiting. 03/30/20  Yes [provider]  Loma Boston Calcium 500 MG TABS Take 1,000 mg by mouth every morning.   Yes [provider]  pravastatin (PRAVACHOL) 40 MG tablet TAKE 1 TABLET BY MOUTH DAILY FOR HYPERLIPIDEMIA. Patient taking differently: Take 40 mg by mouth every morning. 10/22/18  Yes Susy Frizzle, MD  QUEtiapine (SEROQUEL) 25 MG tablet Take 1 tablet (25 mg total) by mouth 2 (two) times daily. 05/10/20  Yes Rondel Jumbo, PA-C  sertraline (ZOLOFT) 100 MG tablet Take 1 tablet (100 mg total) by mouth daily. Patient taking differently: Take 100 mg by mouth every morning. 05/10/20  Yes Rondel Jumbo, PA-C      Allergies    Aricept Reather Littler hcl], Bactrim [sulfamethoxazole-trimethoprim], Flagyl [metronidazole], and Lisinopril    Review of Systems   Review of Systems  Unable to perform ROS: Dementia   Physical Exam Updated Vital Signs BP (!) 141/63    Pulse 96    Temp  98.1 F (36.7 C) (Oral)    Resp (!) 23    SpO2 95%  Physical Exam Vitals and nursing note reviewed.  Constitutional:      Appearance: She is well-developed.     Interventions: Cervical collar in place.  HENT:     Head: Normocephalic.   Eyes:     Extraocular Movements: Extraocular movements intact.  Neck:     Comments: Mild posterior neck tenderness. No deformity Cardiovascular:     Rate and Rhythm: Normal rate and regular rhythm.     Heart sounds: Normal heart sounds.  Pulmonary:     Effort: Pulmonary effort is normal.     Breath sounds: Normal breath sounds.  Abdominal:     Palpations: Abdomen is soft.     Tenderness: There is no  abdominal tenderness.  Musculoskeletal:     Right hip: No tenderness. Normal range of motion.     Left hip: No tenderness. Normal range of motion.  Skin:    General: Skin is warm and dry.  Neurological:     Mental Status: She is alert.     Comments: Awake, alert, oriented to self.  Disoriented to everything else.  Equal strength in all 4 extremities.    ED Results / Procedures / Treatments   Labs (all labs ordered are listed, but only abnormal results are displayed) Labs Reviewed  BASIC METABOLIC PANEL - Abnormal; Notable for the following components:      Result Value   Potassium 3.4 (*)    GFR, Estimated 58 (*)    All other components within normal limits  CBC WITH DIFFERENTIAL/PLATELET    EKG EKG Interpretation  Date/Time:  Monday February 27 2021 09:20:13 EST Ventricular Rate:  73 PR Interval:    QRS Duration: 103 QT Interval:  441 QTC Calculation: 490 R Axis:   -45 Text Interpretation: Sinus rhythm Left anterior fascicular block Low voltage, precordial leads Abnormal R-wave progression, late transition Borderline prolonged QT interval Interpretation limited secondary to artifact Confirmed by Sherwood Gambler [phone]) on 02/27/2021 9:31:15 AM  Radiology CT Head Wo Contrast  Result Date: 02/27/2021 CLINICAL DATA:  81 year old female status post unwitnessed fall. Posterior head laceration. EXAM: CT HEAD WITHOUT CONTRAST TECHNIQUE: Contiguous axial images were obtained from the base of the skull through the vertex without intravenous contrast. RADIATION DOSE REDUCTION: This exam was performed according to the departmental dose-optimization program which includes automated exposure control, adjustment of the mA and/or kV according to patient size and/or use of iterative reconstruction technique. COMPARISON:  Brain MRI 12/03/2016.  Head CT 10/30/2020. FINDINGS: Brain: Stable cerebral volume. No midline shift, ventriculomegaly, mass effect, evidence of mass lesion, intracranial  hemorrhage or evidence of cortically based acute infarction. Gray-white matter differentiation appears stable and largely normal for age throughout the brain. Mild periventricular white matter hypodensity primarily at the frontal horns does appear progressed since the 2018 MRI. No cortical encephalomalacia identified. Vascular: Calcified atherosclerosis at the skull base. No suspicious intracranial vascular hyperdensity. Skull: Intact, negative. Sinuses/Orbits: Visualized paranasal sinuses and mastoids are clear. Tympanic cavities are clear. Other: Broad-based left posterior convexity scalp soft tissue swelling with mild skin irregularity, mild hematoma/contusion. Underlying calvarium intact. Other orbit and scalp soft tissues appear negative. IMPRESSION: 1. Left posterior convexity scalp soft tissue injury without underlying skull fracture. 2. No acute intracranial abnormality. Cerebral white matter changes most commonly due to small vessel disease, progressed since a 2018 MRI. Electronically Signed   By: Genevie Ann M.D.   On: 02/27/2021 10:24  CT Cervical Spine Wo Contrast  Result Date: 02/27/2021 CLINICAL DATA:  81 year old female status post unwitnessed fall. Posterior head laceration. EXAM: CT CERVICAL SPINE WITHOUT CONTRAST TECHNIQUE: Multidetector CT imaging of the cervical spine was performed without intravenous contrast. Multiplanar CT image reconstructions were also generated. RADIATION DOSE REDUCTION: This exam was performed according to the departmental dose-optimization program which includes automated exposure control, adjustment of the mA and/or kV according to patient size and/or use of iterative reconstruction technique. COMPARISON:  Head CT today reported separately. Face CT 10/30/2020. FINDINGS: Alignment: Mild straightening of cervical lordosis. Mild degenerative anterolisthesis of C4 on C5 is a company by facet degeneration and ankylosis on the right side. Similar degenerative anterolisthesis  C7 on T1. Bilateral posterior element alignment is within normal limits. Skull base and vertebrae: Visualized skull base is intact. No atlanto-occipital dissociation. C1-C2 alignment appears stable since last year, with no fracture at those levels. No acute osseous abnormality identified. Soft tissues and spinal canal: No prevertebral fluid or swelling. No visible canal hematoma. Mild calcified carotid atherosclerosis in the neck. Hypodense 14 mm right thyroid lobe nodule Not clinically significant; no follow-up imaging recommended (ref: J Am Coll Radiol. 2015 Feb;12(2): 143-50). Disc levels: Chronic facet ankylosis on the right at C4-C5 with widespread bilateral additional degenerative facet hypertrophy. Comparatively mild disc and endplate degeneration. Degenerative ligamentous hypertrophy about the odontoid. No definite spinal stenosis. Upper chest: Visible upper thoracic levels appear intact. Suggestion of pulmonary hyperinflation in the lung apices, mild respiratory motion. IMPRESSION: 1. No acute traumatic injury identified in the cervical spine. 2. 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 Electronically Signed   By: Genevie Ann M.D.   On: 02/27/2021 10:29    Procedures .Marland KitchenLaceration Repair  Date/Time: 02/27/2021 10:40 AM Performed by: Sherwood Gambler, MD Authorized by: Sherwood Gambler, MD   Consent:    Consent obtained:  Verbal   Consent given by:  Healthcare agent Universal protocol:    Patient identity confirmed:  Verbally with patient Anesthesia:    Anesthesia method:  None Laceration details:    Location:  Scalp   Scalp location:  Occipital   Length (cm):  1 Pre-procedure details:    Preparation:  Patient was prepped and draped in usual sterile fashion and imaging obtained to evaluate for foreign bodies Exploration:    Limited defect created (wound extended): no   Treatment:    Area cleansed with:  Saline   Amount  of cleaning:  Standard   Irrigation solution:  Sterile saline   Irrigation method:  Syringe Skin repair:    Repair method:  Staples   Number of staples:  1 Approximation:    Approximation:  Close Repair type:    Repair type:  Simple Post-procedure details:    Dressing:  Open (no dressing)   Procedure completion:  Tolerated well, no immediate complications    Medications Ordered in ED Medications  potassium chloride SA (KLOR-CON M) CR tablet 20 mEq (has no administration in time range)  sodium chloride 0.9 % bolus 1,000 mL (1,000 mLs Intravenous New Bag/Given 02/27/21 0932)  Tdap (BOOSTRIX) injection 0.5 mL (0.5 mLs Intramuscular Given 02/27/21 1109)    ED Course/ Medical Decision Making/ A&P                           Medical Decision Making Problems Addressed: Laceration of occipital scalp, initial encounter: acute illness or injury that poses a threat  to life or bodily functions  Amount and/or Complexity of Data Reviewed Independent Historian:     Details: Nursing Home External Data Reviewed: notes. Labs: ordered. Radiology: ordered and independent interpretation performed. ECG/medicine tests: ordered and independent interpretation performed.   Patient presents after an unwitnessed fall.  Here she is otherwise well-appearing with besides a small laceration to her scalp.  Her ECG shows a sinus rhythm without obvious ischemia.  Because of the fall and the unwitnessed nature, ECG, labs and CT imaging was obtained.  Fortunately her CT shows no acute abnormalities.  I personally viewed the CT head images and see no obvious bleeding or skull fractures.  Because of the negative C-spine CT, her collar was removed.  Labs have been reviewed and there is mild hypokalemia but otherwise the labs are benign.  Son is at the bedside and states she is acting normally to him.  She was able to get up and walk without any significant assistance and appears stable for discharge home.  1 staple was  placed to close the small wound after irrigation.  The nursing home physician can remove this in about a week.  On chart review I cannot see any obvious previous Tdap immunization so she will be given that here prior to discharge.        Final Clinical Impression(s) / ED Diagnoses Final diagnoses:  Laceration of occipital scalp, initial encounter    Rx / DC Orders ED Discharge Orders     None         Sherwood Gambler, MD 02/27/21 1126

## 2021-02-27 NOTE — ED Notes (Signed)
ED Provider at bedside. 

## 2021-02-28 ENCOUNTER — Telehealth: Payer: Self-pay | Admitting: Physician Assistant

## 2021-02-28 NOTE — Telephone Encounter (Signed)
Follow up with PCP, notified daughter, they have an appt on Thursday. Will call back if needs anything, else. Thanked me for calling.

## 2021-02-28 NOTE — Telephone Encounter (Signed)
Patient's daughter [name] called requesting a call back about some questions she has. Patient lives in a memory care unit.  She said the patient has gone from 180 to 153 pounds since seeing Clarise Cruz in October 2023. Is this normal and okay?  Also, patient fell yesterday and had to get a staple in her head. Is this normal to have more falls with her diagnosis?

## 2021-03-02 ENCOUNTER — Other Ambulatory Visit: Payer: Self-pay

## 2021-03-02 ENCOUNTER — Emergency Department (HOSPITAL_BASED_OUTPATIENT_CLINIC_OR_DEPARTMENT_OTHER)
Admission: EM | Admit: 2021-03-02 | Discharge: 2021-03-02 | Disposition: A | Discharge status: Home / Self Care | Payer: PPO | Attending: Emergency Medicine | Admitting: Emergency Medicine

## 2021-03-02 ENCOUNTER — Emergency Department (HOSPITAL_BASED_OUTPATIENT_CLINIC_OR_DEPARTMENT_OTHER): Payer: PPO

## 2021-03-02 ENCOUNTER — Emergency Department (HOSPITAL_BASED_OUTPATIENT_CLINIC_OR_DEPARTMENT_OTHER): Payer: PPO | Admitting: Radiology

## 2021-03-02 ENCOUNTER — Encounter (HOSPITAL_BASED_OUTPATIENT_CLINIC_OR_DEPARTMENT_OTHER): Payer: Self-pay

## 2021-03-02 DIAGNOSIS — S199XXA Unspecified injury of neck, initial encounter: Secondary | ICD-10-CM | POA: Diagnosis not present

## 2021-03-02 DIAGNOSIS — W19XXXA Unspecified fall, initial encounter: Secondary | ICD-10-CM | POA: Diagnosis not present

## 2021-03-02 DIAGNOSIS — M2578 Osteophyte, vertebrae: Secondary | ICD-10-CM | POA: Diagnosis not present

## 2021-03-02 DIAGNOSIS — N390 Urinary tract infection, site not specified: Secondary | ICD-10-CM | POA: Insufficient documentation

## 2021-03-02 DIAGNOSIS — S40022A Contusion of left upper arm, initial encounter: Secondary | ICD-10-CM | POA: Insufficient documentation

## 2021-03-02 DIAGNOSIS — F0390 Unspecified dementia without behavioral disturbance: Secondary | ICD-10-CM | POA: Insufficient documentation

## 2021-03-02 DIAGNOSIS — Z7982 Long term (current) use of aspirin: Secondary | ICD-10-CM | POA: Diagnosis not present

## 2021-03-02 DIAGNOSIS — N3000 Acute cystitis without hematuria: Secondary | ICD-10-CM | POA: Diagnosis not present

## 2021-03-02 DIAGNOSIS — R1110 Vomiting, unspecified: Secondary | ICD-10-CM | POA: Diagnosis not present

## 2021-03-02 DIAGNOSIS — D72829 Elevated white blood cell count, unspecified: Secondary | ICD-10-CM | POA: Diagnosis not present

## 2021-03-02 DIAGNOSIS — S060X0A Concussion without loss of consciousness, initial encounter: Secondary | ICD-10-CM | POA: Diagnosis not present

## 2021-03-02 DIAGNOSIS — S4992XA Unspecified injury of left shoulder and upper arm, initial encounter: Secondary | ICD-10-CM | POA: Diagnosis present

## 2021-03-02 DIAGNOSIS — R9431 Abnormal electrocardiogram [ECG] [EKG]: Secondary | ICD-10-CM | POA: Diagnosis not present

## 2021-03-02 DIAGNOSIS — S0990XA Unspecified injury of head, initial encounter: Secondary | ICD-10-CM | POA: Diagnosis not present

## 2021-03-02 LAB — URINALYSIS, ROUTINE W REFLEX MICROSCOPIC
Bilirubin Urine: NEGATIVE
Glucose, UA: NEGATIVE mg/dL
Hgb urine dipstick: NEGATIVE
Ketones, ur: >80 mg/dL — AB
Nitrite: POSITIVE — AB
Protein, ur: 30 mg/dL — AB
Specific Gravity, Urine: 1.024 (ref 1.005–1.030)
pH: 6 (ref 5.0–8.0)

## 2021-03-02 LAB — CBC WITH DIFFERENTIAL/PLATELET
Abs Immature Granulocytes: 0.06 10*3/uL (ref 0.00–0.07)
Basophils Absolute: 0 10*3/uL (ref 0.0–0.1)
Basophils Relative: 0 %
Eosinophils Absolute: 0 10*3/uL (ref 0.0–0.5)
Eosinophils Relative: 0 %
HCT: 40.2 % (ref 36.0–46.0)
Hemoglobin: 12.8 g/dL (ref 12.0–15.0)
Immature Granulocytes: 1 %
Lymphocytes Relative: 16 %
Lymphs Abs: 1.8 10*3/uL (ref 0.7–4.0)
MCH: 27.3 pg (ref 26.0–34.0)
MCHC: 31.8 g/dL (ref 30.0–36.0)
MCV: 85.7 fL (ref 80.0–100.0)
Monocytes Absolute: 0.6 10*3/uL (ref 0.1–1.0)
Monocytes Relative: 6 %
Neutro Abs: 8.5 10*3/uL — ABNORMAL HIGH (ref 1.7–7.7)
Neutrophils Relative %: 77 %
Platelets: 217 10*3/uL (ref 150–400)
RBC: 4.69 MIL/uL (ref 3.87–5.11)
RDW: 15.1 % (ref 11.5–15.5)
WBC: 11 10*3/uL — ABNORMAL HIGH (ref 4.0–10.5)
nRBC: 0 % (ref 0.0–0.2)

## 2021-03-02 LAB — COMPREHENSIVE METABOLIC PANEL
ALT: 18 U/L (ref 0–44)
AST: 29 U/L (ref 15–41)
Albumin: 3.6 g/dL (ref 3.5–5.0)
Alkaline Phosphatase: 84 U/L (ref 38–126)
Anion gap: 17 — ABNORMAL HIGH (ref 5–15)
BUN: 19 mg/dL (ref 8–23)
CO2: 21 mmol/L — ABNORMAL LOW (ref 22–32)
Calcium: 9 mg/dL (ref 8.9–10.3)
Chloride: 101 mmol/L (ref 98–111)
Creatinine, Ser: 0.68 mg/dL (ref 0.44–1.00)
GFR, Estimated: >60 mL/min (ref >60)
Glucose, Bld: 91 mg/dL (ref 70–99)
Potassium: 3.5 mmol/L (ref 3.5–5.1)
Sodium: 139 mmol/L (ref 135–145)
Total Bilirubin: 0.9 mg/dL (ref 0.3–1.2)
Total Protein: 6.2 g/dL — ABNORMAL LOW (ref 6.5–8.1)

## 2021-03-02 MED ORDER — CEPHALEXIN 250 MG PO CAPS
500.0000 mg | ORAL_CAPSULE | Freq: Once | ORAL | Status: AC
  Administered 2021-03-02: 500 mg via ORAL
  Filled 2021-03-02: qty 2

## 2021-03-02 MED ORDER — CEPHALEXIN 500 MG PO CAPS: 500.0000 mg | ORAL_CAPSULE | Freq: Four times a day (QID) | ORAL | 0 refills | Status: AC

## 2021-03-02 NOTE — Discharge Instructions (Signed)
The urine culture has been sent.  You will be notified if it does not match up with the antibiotics.  Return if she is unable to eat and drink or has increased mental status changes.  Have the memory care unit encourage oral intake for her also.

## 2021-03-02 NOTE — ED Notes (Signed)
RN ambulated pt to restroom. Pt unable to urinate at this time.

## 2021-03-02 NOTE — ED Triage Notes (Signed)
Pt BIB [facility] pt from [facility]. Per PT pt was found lying next to her bed on the floor. PT was concerned for a concussion, pt denies any injury, pain, discomfort, no blood thinners. Unsure of LOC or mechanism of fall d/t it was an unwitnessed fall. Pt is a/o at baseline per facility. Pt reports she is confused as to why she's here.  BP 150/72 HR 70 RR 18 93% RA  CBG 121

## 2021-03-02 NOTE — ED Provider Notes (Signed)
°  Physical Exam  BP 130/77 (BP Location: Right Arm)    Pulse 84    Temp 97.6 F (36.4 C)    Resp 16    SpO2 100%   Physical Exam  Procedures  Procedures  ED Course / MDM    Medical Decision Making Amount and/or Complexity of Data Reviewed Labs: ordered. Radiology: ordered.  Risk Prescription drug management.   Patient presented after fall.  Imaging reassuring.  No fracture from the fall.  However has had more falls.  Apparent UTI with some dehydration.  Has been tolerating orals here.  Urine culture sent.  We will treat with Keflex.  Discussed with the patient's daughter.  Patient lives in memory care unit and she feels that if there is enough support there to manage her.  Will discharge       Davonna Belling, MD 03/02/21 1919

## 2021-03-02 NOTE — ED Notes (Signed)
Pt pulling off cardiac monitor and pulse ox cords. Cords removed from pt to decrease stimulation. Family at bedside

## 2021-03-02 NOTE — ED Provider Notes (Signed)
[facility] EMERGENCY DEPT Provider Note   CSN: [id number] Arrival date & time: 03/02/21  1212  LEVEL 5 CAVEAT - DEMENTIA   History  Chief Complaint  Patient presents with   [name] is a 81 y.o. female.  HPI 81 year old female presents with fall and presumed head injury with an episode of vomiting.  History is initially taken from the daughter at the bedside.  She did not see any fall as the patient lives at [facility] but when she called to check on the patient because she fell a few days ago, they told her they had sent her out to the hospital for a fall and an episode of vomiting.  Right now, the patient seems a little confused from her normal confused baseline according to the daughter.  It slight and this seems similar to when she has had an issue with UTIs in the past.  When she has had a UTI in the past she is also had issues with falling.  She was in [facility] ER on 2/6 and had to receive a staple.  Otherwise, the patient has dementia and is unable to provide significant history.  Home Medications Prior to Admission medications   Medication Sig Start Date End Date Taking? Authorizing Provider  acetaminophen (TYLENOL) 325 MG tablet Take 650 mg by mouth 3 (three) times daily.   Yes [provider]  aspirin 81 MG chewable tablet Chew 81 mg by mouth every morning.   Yes [provider]  Cholecalciferol (VITAMIN D) 2000 UNITS CAPS Take 4,000 Units by mouth every morning.   Yes [provider]  JANUVIA 100 MG tablet TAKE 1 TABLET BY MOUTH DAILY FOR DIABETES. Patient taking differently: Take 100 mg by mouth every morning. For diabetes 10/22/18  Yes Susy Frizzle, MD  losartan (COZAAR) 25 MG tablet Take 25 mg by mouth every morning. Check blood pressure before giving and hold for SBP <110   Yes [provider]  memantine (NAMENDA) 10 MG tablet TAKE 1 TABLET BY MOUTH TWO TIMES A DAY FOR DEMENTIA. Patient taking  differently: Take 10 mg by mouth 2 (two) times daily. For dementia 05/10/20  Yes Shawn Route, Coralee Pesa, PA-C  omeprazole (PRILOSEC) 20 MG capsule Take 20 mg by mouth every morning.   Yes [provider]  Loma Boston Calcium 500 MG TABS Take 1,000 mg by mouth every morning.   Yes [provider]  pravastatin (PRAVACHOL) 40 MG tablet TAKE 1 TABLET BY MOUTH DAILY FOR HYPERLIPIDEMIA. Patient taking differently: Take 40 mg by mouth every morning. 10/22/18  Yes Susy Frizzle, MD  QUEtiapine (SEROQUEL) 25 MG tablet Take 1 tablet (25 mg total) by mouth 2 (two) times daily. 05/10/20  Yes Rondel Jumbo, PA-C  sertraline (ZOLOFT) 100 MG tablet Take 1 tablet (100 mg total) by mouth daily. Patient taking differently: Take 100 mg by mouth every morning. 05/10/20  Yes Rondel Jumbo, PA-C      Allergies    Aricept Reather Littler hcl], Bactrim [sulfamethoxazole-trimethoprim], Flagyl [metronidazole], and Lisinopril    Review of Systems   Review of Systems  Unable to perform ROS: Dementia   Physical Exam Updated Vital Signs BP (!) 154/104    Pulse 73    Temp 97.6 F (36.4 C)    Resp 10    SpO2 100%  Physical Exam Vitals and nursing note reviewed.  Constitutional:      Appearance: She is well-developed.  HENT:  Head: Normocephalic.   Cardiovascular:     Rate and Rhythm: Normal rate and regular rhythm.     Heart sounds: Normal heart sounds.  Pulmonary:     Effort: Pulmonary effort is normal.     Breath sounds: Normal breath sounds.  Abdominal:     Palpations: Abdomen is soft.     Tenderness: There is no abdominal tenderness.  Musculoskeletal:     Cervical back: No spinous process tenderness.       Back:     Comments: Normal ROM of hips, shoulders. There is antecubital ecchymosis to the left arm, but otherwise no bony tenderness.   Skin:    General: Skin is warm and dry.  Neurological:     Mental Status: She is alert.     Comments: 5/5 strength in all 4 extremities.    ED  Results / Procedures / Treatments   Labs (all labs ordered are listed, but only abnormal results are displayed) Labs Reviewed  COMPREHENSIVE METABOLIC PANEL - Abnormal; Notable for the following components:      Result Value   CO2 21 (*)    Total Protein 6.2 (*)    Anion gap 17 (*)    All other components within normal limits  CBC WITH DIFFERENTIAL/PLATELET - Abnormal; Notable for the following components:   WBC 11.0 (*)    Neutro Abs 8.5 (*)    All other components within normal limits  URINALYSIS, ROUTINE W REFLEX MICROSCOPIC    EKG EKG Interpretation  Date/Time:  Thursday March 02 2021 13:35:31 EST Ventricular Rate:  68 PR Interval:  142 QRS Duration: 96 QT Interval:  462 QTC Calculation: 492 R Axis:   -45 Text Interpretation: Sinus rhythm Left anterior fascicular block Borderline prolonged QT interval Baseline wander in lead(s) V1 Interpretation limited secondary to artifact Confirmed by Sherwood Gambler [phone]) on 03/02/2021 2:05:11 PM  Radiology DG Thoracic Spine W/Swimmers  Result Date: 03/02/2021 CLINICAL DATA:  Patient was found lying next to her blood on the floor. EXAM: THORACIC SPINE - 3 VIEWS COMPARISON:  None. FINDINGS: There is no evidence of thoracic spine fracture. Alignment is normal. Advanced multilevel degenerative disc disease with disc space narrowing and marginal osteophytes. Osteopenia. IMPRESSION: 1.  No evidence of acute fracture or subluxation. 2. Moderate multilevel degenerate disc disease with disc space narrowing and prominent osteophytes. Osteopenia. Electronically Signed   By: Keane Police D.O.   On: 03/02/2021 14:19   CT Head Wo Contrast  Result Date: 03/02/2021 CLINICAL DATA:  Head trauma, minor (Age >= 65y) EXAM: CT HEAD WITHOUT CONTRAST TECHNIQUE: Contiguous axial images were obtained from the base of the skull through the vertex without intravenous contrast. RADIATION DOSE REDUCTION: This exam was performed according to the departmental  dose-optimization program which includes automated exposure control, adjustment of the mA and/or kV according to patient size and/or use of iterative reconstruction technique. COMPARISON:  02/27/2021 FINDINGS: Brain: There is no acute intracranial hemorrhage, mass effect, or edema. Gray-white differentiation is preserved. There is no extra-axial fluid collection. Prominence of the ventricles and sulci reflects stable parenchymal volume loss. Patchy and confluent areas of hypoattenuation in the supratentorial white matter probably reflects stable chronic microvascular ischemic changes. Vascular: There is atherosclerotic calcification at the skull base. Skull: Calvarium is unremarkable. Sinuses/Orbits: No acute finding. Other: None. IMPRESSION: No evidence of acute intracranial injury. Electronically Signed   By: Macy Mis M.D.   On: 03/02/2021 14:16   CT Cervical Spine Wo Contrast  Result Date: 03/02/2021 CLINICAL  DATA:  Neck trauma EXAM: CT CERVICAL SPINE WITHOUT CONTRAST TECHNIQUE: Multidetector CT imaging of the cervical spine was performed without intravenous contrast. Multiplanar CT image reconstructions were also generated. RADIATION DOSE REDUCTION: This exam was performed according to the departmental dose-optimization program which includes automated exposure control, adjustment of the mA and/or kV according to patient size and/or use of iterative reconstruction technique. COMPARISON:  02/27/2021 FINDINGS: Alignment: Stable. Skull base and vertebrae: Stable vertebral body heights. No acute fracture. Soft tissues and spinal canal: No prevertebral fluid or swelling. No visible canal hematoma. Disc levels: Degenerative changes are stable over the short interval. Upper chest: No new abnormality. Other: No new abnormality. IMPRESSION: No acute cervical spine fracture. Electronically Signed   By: Macy Mis M.D.   On: 03/02/2021 14:21    Procedures Procedures    Medications Ordered in  ED Medications - No data to display  ED Course/ Medical Decision Making/ A&P                           Medical Decision Making Amount and/or Complexity of Data Reviewed Labs: ordered. Radiology: ordered.   Patient is well-appearing with no acute complaints.  Labs were obtained which show a slight leukocytosis compared to last time but her potassium has improved.  Otherwise labs are baseline.  CT head, cervical spine, and T-spine obtained given locations of discomfort and presumed fall with head injury.  I have reviewed the head CT images and there is no obvious head bleed.  Other images are negative.  ECG seems similar to baseline on my read.  At this point, we are waiting on a urinalysis.  I feel like she can be discharged back to her facility either way.  I have updated the daughter.  Care transferred to Dr. Alvino Chapel.        Final Clinical Impression(s) / ED Diagnoses Final diagnoses:  Fall, initial encounter    Rx / DC Orders ED Discharge Orders     None         Sherwood Gambler, MD 03/02/21 1535

## 2021-03-02 NOTE — ED Notes (Signed)
Patient transported to CT 

## 2021-03-02 NOTE — ED Notes (Signed)
Pt verbalizes understanding of discharge instructions. Opportunity for questioning and answers were provided. Pt discharged from ED to SNF with Daughter.

## 2021-03-06 LAB — URINE CULTURE: Culture: >=100000 — AB

## 2021-03-07 ENCOUNTER — Telehealth: Payer: Self-pay | Admitting: *Deleted

## 2021-03-07 DIAGNOSIS — F32A Depression, unspecified: Secondary | ICD-10-CM | POA: Diagnosis not present

## 2021-03-07 DIAGNOSIS — F0390 Unspecified dementia without behavioral disturbance: Secondary | ICD-10-CM | POA: Diagnosis not present

## 2021-03-07 DIAGNOSIS — Z7984 Long term (current) use of oral hypoglycemic drugs: Secondary | ICD-10-CM | POA: Diagnosis not present

## 2021-03-07 DIAGNOSIS — Z9181 History of falling: Secondary | ICD-10-CM | POA: Diagnosis not present

## 2021-03-07 DIAGNOSIS — E119 Type 2 diabetes mellitus without complications: Secondary | ICD-10-CM | POA: Diagnosis not present

## 2021-03-07 DIAGNOSIS — I10 Essential (primary) hypertension: Secondary | ICD-10-CM | POA: Diagnosis not present

## 2021-03-07 DIAGNOSIS — Z7982 Long term (current) use of aspirin: Secondary | ICD-10-CM | POA: Diagnosis not present

## 2021-03-07 DIAGNOSIS — E785 Hyperlipidemia, unspecified: Secondary | ICD-10-CM | POA: Diagnosis not present

## 2021-03-07 NOTE — Telephone Encounter (Signed)
Post ED Visit - Positive Culture Follow-up  Culture report reviewed by antimicrobial stewardship pharmacist: [place] Team []  Elenor Quinones, Pharm.D. []  Heide Guile, Pharm.D., BCPS AQ-ID []  Parks Neptune, Pharm.D., BCPS []  Alycia Rossetti, Pharm.D., BCPS []  [place], Pharm.D., BCPS, AAHIVP []  Legrand Como, Pharm.D., BCPS, AAHIVP []  Salome Arnt, PharmD, BCPS []  Johnnette Gourd, PharmD, BCPS []  Hughes Better, PharmD, BCPS []  Leeroy Cha, PharmD []  Laqueta Linden, PharmD, BCPS []  Albertina Parr, PharmD  [place] Team []  Leodis Sias, PharmD []  Lindell Spar, PharmD []  Royetta Asal, PharmD []  Graylin Shiver, Rph []  Rema Fendt) Glennon Mac, PharmD []  Arlyn Dunning, PharmD []  Netta Cedars, PharmD []  Dia Sitter, PharmD []  Leone Haven, PharmD []  Gretta Arab, PharmD []  Theodis Shove, PharmD []  Peggyann Juba, PharmD []  Reuel Boom, PharmD   Positive urine culture Treated with Cephalexin, organism sensitive to the same and no further patient follow-up is required at this time.  Bertis Ruddy, PharmD  Harlon Flor Talley 03/07/2021, 9:27 AM

## 2021-03-09 DIAGNOSIS — N390 Urinary tract infection, site not specified: Secondary | ICD-10-CM | POA: Diagnosis not present

## 2021-03-09 DIAGNOSIS — E119 Type 2 diabetes mellitus without complications: Secondary | ICD-10-CM | POA: Diagnosis not present

## 2021-03-09 DIAGNOSIS — S0101XA Laceration without foreign body of scalp, initial encounter: Secondary | ICD-10-CM | POA: Diagnosis not present

## 2021-03-09 DIAGNOSIS — F01B18 Vascular dementia, moderate, with other behavioral disturbance: Secondary | ICD-10-CM | POA: Diagnosis not present

## 2021-03-10 DIAGNOSIS — R143 Flatulence: Secondary | ICD-10-CM | POA: Diagnosis not present

## 2021-03-16 DIAGNOSIS — F01B18 Vascular dementia, moderate, with other behavioral disturbance: Secondary | ICD-10-CM | POA: Diagnosis not present

## 2021-03-16 DIAGNOSIS — I10 Essential (primary) hypertension: Secondary | ICD-10-CM | POA: Diagnosis not present

## 2021-03-16 DIAGNOSIS — R1110 Vomiting, unspecified: Secondary | ICD-10-CM | POA: Diagnosis not present

## 2021-03-16 DIAGNOSIS — R634 Abnormal weight loss: Secondary | ICD-10-CM | POA: Diagnosis not present

## 2021-03-23 ENCOUNTER — Encounter (HOSPITAL_BASED_OUTPATIENT_CLINIC_OR_DEPARTMENT_OTHER): Payer: Self-pay

## 2021-03-23 ENCOUNTER — Emergency Department (HOSPITAL_BASED_OUTPATIENT_CLINIC_OR_DEPARTMENT_OTHER)

## 2021-03-23 ENCOUNTER — Emergency Department (HOSPITAL_BASED_OUTPATIENT_CLINIC_OR_DEPARTMENT_OTHER)
Admission: EM | Admit: 2021-03-23 | Discharge: 2021-03-23 | Disposition: A | Discharge status: Home / Self Care | Attending: Emergency Medicine | Admitting: Emergency Medicine

## 2021-03-23 ENCOUNTER — Other Ambulatory Visit: Payer: Self-pay

## 2021-03-23 DIAGNOSIS — S8001XA Contusion of right knee, initial encounter: Secondary | ICD-10-CM | POA: Diagnosis not present

## 2021-03-23 DIAGNOSIS — F0390 Unspecified dementia without behavioral disturbance: Secondary | ICD-10-CM | POA: Insufficient documentation

## 2021-03-23 DIAGNOSIS — Z7982 Long term (current) use of aspirin: Secondary | ICD-10-CM | POA: Diagnosis not present

## 2021-03-23 DIAGNOSIS — W07XXXA Fall from chair, initial encounter: Secondary | ICD-10-CM | POA: Insufficient documentation

## 2021-03-23 DIAGNOSIS — W19XXXA Unspecified fall, initial encounter: Secondary | ICD-10-CM | POA: Diagnosis not present

## 2021-03-23 DIAGNOSIS — M1611 Unilateral primary osteoarthritis, right hip: Secondary | ICD-10-CM | POA: Diagnosis not present

## 2021-03-23 DIAGNOSIS — S8991XA Unspecified injury of right lower leg, initial encounter: Secondary | ICD-10-CM | POA: Diagnosis present

## 2021-03-23 DIAGNOSIS — M1711 Unilateral primary osteoarthritis, right knee: Secondary | ICD-10-CM | POA: Diagnosis not present

## 2021-03-23 DIAGNOSIS — R0902 Hypoxemia: Secondary | ICD-10-CM | POA: Diagnosis not present

## 2021-03-23 NOTE — ED Notes (Signed)
Pt's daughter in law betsy Stanek took Pt to [facility]. Jackelyn Hoehn RN at [facility]. ?

## 2021-03-23 NOTE — ED Notes (Signed)
[facility] called for transport 15:31 ?

## 2021-03-23 NOTE — ED Notes (Signed)
Spoke to [employer] at [facility] Even though the Pt denied Pain upon her arrival, Pt complained of pain to right upper thigh and right pelvis area. MD notified and orders put in for imaging same. ?

## 2021-03-23 NOTE — ED Notes (Signed)
Pt denis pain. Pt has hematoma to right knee. Pt still denis pain upon palpitation ?

## 2021-03-23 NOTE — ED Triage Notes (Signed)
Patient BIB [facility] from [place] at [facility] for Fall. ? ?Patient was sitting in Chair when she slipped out and hit the Floor. No Head Injury. No Blood Thinning Medications. No LOC.  ? ?No Notable Injuries but Patient endorses Discomfort upon Palpation to Right Upper Knee. ? ?History of Dementia. Confused at Baseline., VSS with [facility]. BIB Wheelchair and transferred to Wheelchair.  ?

## 2021-03-23 NOTE — ED Notes (Signed)
Family with Pt. Pt denis pain and has a good disposition ?

## 2021-03-23 NOTE — ED Notes (Signed)
Patient now endorses No Complaints at this Time. ? ?Fall Risk Bracelet Applied and Patient placed in Observable Area due to Fall Risk until Patient can be placed in Examination Room. ?

## 2021-03-23 NOTE — ED Provider Notes (Signed)
?[facility] ?Provider Note ? ? ?CSN: [id number] ?Arrival date & time: 03/23/21  1255 ? ?  ? ?History ? ?Chief Complaint  ?Patient presents with  ? Fall  ? ? ?[name] is a 81 y.o. female. ? ?81 year old female with history of dementia who presents after witnessed fall at her facility.  Was sitting in a chair when she slipped down hit the floor.  No head injury.  Does not take any blood thinners.  Complains of bruising to her right knee.  She is confused at baseline and currently this is unchanged.  EMS called vital signs were stable and patient transported here ? ? ?  ? ?Home Medications ?Prior to Admission medications   ?Medication Sig Start Date End Date Taking? Authorizing Provider  ?losartan (COZAAR) 25 MG tablet Take 12.5 mg by mouth daily. Check blood pressure before giving and hold for SBP <110   Yes [provider]  ?PRESCRIPTION MEDICATION 1 cup Tid with meals for supplement   Yes [provider]  ?acetaminophen (TYLENOL) 325 MG tablet Take 650 mg by mouth 3 (three) times daily.    [provider]  ?aspirin 81 MG chewable tablet Chew 81 mg by mouth every morning.    [provider]  ?Cholecalciferol (VITAMIN D) 2000 UNITS CAPS Take 4,000 Units by mouth every morning.    [provider]  ?JANUVIA 100 MG tablet TAKE 1 TABLET BY MOUTH DAILY FOR DIABETES. ?Patient taking differently: Take 100 mg by mouth every morning. For diabetes 10/22/18   Susy Frizzle, MD  ?memantine (NAMENDA) 10 MG tablet TAKE 1 TABLET BY MOUTH TWO TIMES A DAY FOR DEMENTIA. ?Patient taking differently: Take 10 mg by mouth 2 (two) times daily. For dementia 05/10/20   Rondel Jumbo, PA-C  ?omeprazole (PRILOSEC) 20 MG capsule Take 20 mg by mouth every morning.    [provider]  ?Loma [place] Calcium 500 MG TABS Take 1,000 mg by mouth every morning.    [provider]  ?pravastatin (PRAVACHOL) 40 MG tablet TAKE 1 TABLET BY MOUTH DAILY FOR  HYPERLIPIDEMIA. ?Patient taking differently: Take 40 mg by mouth every morning. 10/22/18   Susy Frizzle, MD  ?QUEtiapine (SEROQUEL) 25 MG tablet Take 1 tablet (25 mg total) by mouth 2 (two) times daily. 05/10/20   Rondel Jumbo, PA-C  ?sertraline (ZOLOFT) 100 MG tablet Take 1 tablet (100 mg total) by mouth daily. ?Patient taking differently: Take 100 mg by mouth every morning. 05/10/20   Rondel Jumbo, PA-C  ?   ? ?Allergies    ?Aricept [donepezil hcl], Bactrim [sulfamethoxazole-trimethoprim], Flagyl [metronidazole], and Lisinopril   ? ?Review of Systems   ?Review of Systems  ?Unable to perform ROS: Dementia  ? ?Physical Exam ?Updated Vital Signs ?BP 118/71 (BP Location: Left Arm)   Pulse 88   Temp 98.4 ?F (36.9 ?C) (Oral)   Resp 12   Ht 1.651 m (5\' 5" )   Wt 83 kg   SpO2 100%   BMI 30.45 kg/m?  ?Physical Exam ?Vitals and nursing note reviewed.  ?Constitutional:   ?   General: She is not in acute distress. ?   Appearance: Normal appearance. She is well-developed. She is not toxic-appearing.  ?HENT:  ?   Head: Normocephalic and atraumatic.  ?Eyes:  ?   General: Lids are normal.  ?   Conjunctiva/sclera: Conjunctivae normal.  ?   Pupils: Pupils are equal, round, and reactive to light.  ?Neck:  ?  Thyroid: No thyroid mass.  ?   Trachea: No tracheal deviation.  ?Cardiovascular:  ?   Rate and Rhythm: Normal rate and regular rhythm.  ?   Heart sounds: Normal heart sounds. No murmur heard. ?  No gallop.  ?Pulmonary:  ?   Effort: Pulmonary effort is normal. No respiratory distress.  ?   Breath sounds: Normal breath sounds. No stridor. No decreased breath sounds, wheezing, rhonchi or rales.  ?Abdominal:  ?   General: There is no distension.  ?   Palpations: Abdomen is soft.  ?   Tenderness: There is no abdominal tenderness. There is no rebound.  ?Musculoskeletal:     ?   General: No tenderness. Normal range of motion.  ?   Cervical back: Normal range of motion and neck supple.  ?     Legs: ? ?   Comments: Full  range of motion at right as well as left knee.  No decreased range of motion at her hips.  ?Skin: ?   General: Skin is warm and dry.  ?   Findings: No abrasion or rash.  ?Neurological:  ?   Mental Status: She is alert. Mental status is at baseline. She is disoriented and confused.  ?   GCS: GCS eye subscore is 4. GCS verbal subscore is 5. GCS motor subscore is 6.  ?   Cranial Nerves: No cranial nerve deficit.  ?   Sensory: No sensory deficit.  ?   Motor: Motor function is intact.  ?Psychiatric:     ?   Attention and Perception: Attention normal.     ?   Speech: Speech normal.     ?   Behavior: Behavior normal.  ? ? ?ED Results / Procedures / Treatments   ?Labs ?(all labs ordered are listed, but only abnormal results are displayed) ?Labs Reviewed - No data to display ? ?EKG ?None ? ?Radiology ?No results found. ? ?Procedures ?Procedures  ? ? ?Medications Ordered in ED ?Medications - No data to display ? ?ED Course/ Medical Decision Making/ A&P ?  ?                        ?Medical Decision Making ?Amount and/or Complexity of Data Reviewed ?Radiology: ordered. ? ? ?Patient here after witnessed fall onto the right knee and x-rays of form are negative.  She has range of motion.  No concern for concomitant hip injury.  Her mental status is at baseline and will send back to facility ? ? ? ? ? ? ? ?Final Clinical Impression(s) / ED Diagnoses ?Final diagnoses:  ?None  ? ? ?Rx / DC Orders ?ED Discharge Orders   ? ? None  ? ?  ? ? ?  ?Lacretia Leigh, MD ?03/23/21 1422 ? ?

## 2021-03-25 DIAGNOSIS — N390 Urinary tract infection, site not specified: Secondary | ICD-10-CM | POA: Diagnosis not present

## 2021-03-25 DIAGNOSIS — S8391XA Sprain of unspecified site of right knee, initial encounter: Secondary | ICD-10-CM | POA: Diagnosis not present

## 2021-03-25 DIAGNOSIS — R296 Repeated falls: Secondary | ICD-10-CM | POA: Diagnosis not present

## 2021-03-25 DIAGNOSIS — F01B18 Vascular dementia, moderate, with other behavioral disturbance: Secondary | ICD-10-CM | POA: Diagnosis not present

## 2021-03-30 DIAGNOSIS — M25561 Pain in right knee: Secondary | ICD-10-CM | POA: Diagnosis not present

## 2021-03-30 DIAGNOSIS — R296 Repeated falls: Secondary | ICD-10-CM | POA: Diagnosis not present

## 2021-03-30 DIAGNOSIS — R112 Nausea with vomiting, unspecified: Secondary | ICD-10-CM | POA: Diagnosis not present

## 2021-03-30 DIAGNOSIS — F01B18 Vascular dementia, moderate, with other behavioral disturbance: Secondary | ICD-10-CM | POA: Diagnosis not present

## 2021-04-22 DEATH — deceased

## 2021-05-11 ENCOUNTER — Ambulatory Visit: Payer: PPO | Admitting: Physician Assistant
